# Patient Record
Sex: Male | Born: 1955 | Hispanic: No | Marital: Married | Smoking: Current every day smoker
Health system: Southern US, Community
[De-identification: ages and names within clinical notes are randomized; demographics above are authoritative.]

## PROBLEM LIST (undated history)

## (undated) DIAGNOSIS — I255 Ischemic cardiomyopathy: Secondary | ICD-10-CM

## (undated) DIAGNOSIS — F419 Anxiety disorder, unspecified: Secondary | ICD-10-CM

## (undated) DIAGNOSIS — I5020 Unspecified systolic (congestive) heart failure: Secondary | ICD-10-CM

## (undated) DIAGNOSIS — M539 Dorsopathy, unspecified: Secondary | ICD-10-CM

## (undated) DIAGNOSIS — E785 Hyperlipidemia, unspecified: Secondary | ICD-10-CM

## (undated) DIAGNOSIS — I739 Peripheral vascular disease, unspecified: Secondary | ICD-10-CM

## (undated) DIAGNOSIS — I493 Ventricular premature depolarization: Secondary | ICD-10-CM

## (undated) DIAGNOSIS — Z720 Tobacco use: Secondary | ICD-10-CM

## (undated) DIAGNOSIS — M549 Dorsalgia, unspecified: Secondary | ICD-10-CM

## (undated) DIAGNOSIS — G8929 Other chronic pain: Secondary | ICD-10-CM

## (undated) DIAGNOSIS — I2510 Atherosclerotic heart disease of native coronary artery without angina pectoris: Secondary | ICD-10-CM

## (undated) DIAGNOSIS — I10 Essential (primary) hypertension: Secondary | ICD-10-CM

## (undated) DIAGNOSIS — I209 Angina pectoris, unspecified: Secondary | ICD-10-CM

## (undated) DIAGNOSIS — A6920 Lyme disease, unspecified: Secondary | ICD-10-CM

## (undated) DIAGNOSIS — M1990 Unspecified osteoarthritis, unspecified site: Secondary | ICD-10-CM

## (undated) HISTORY — DX: Ventricular premature depolarization: I49.3

## (undated) HISTORY — DX: Unspecified systolic (congestive) heart failure: I50.20

## (undated) HISTORY — PX: CORONARY ANGIOPLASTY: SHX604

## (undated) HISTORY — DX: Peripheral vascular disease, unspecified: I73.9

## (undated) HISTORY — DX: Ischemic cardiomyopathy: I25.5

## (undated) HISTORY — DX: Hyperlipidemia, unspecified: E78.5

## (undated) HISTORY — DX: Tobacco use: Z72.0

---

## 2004-11-05 ENCOUNTER — Emergency Department: Payer: Self-pay | Admitting: Emergency Medicine

## 2005-12-10 DIAGNOSIS — I219 Acute myocardial infarction, unspecified: Secondary | ICD-10-CM

## 2005-12-10 HISTORY — DX: Acute myocardial infarction, unspecified: I21.9

## 2005-12-29 ENCOUNTER — Other Ambulatory Visit: Payer: Self-pay

## 2005-12-29 ENCOUNTER — Emergency Department: Payer: Self-pay | Admitting: Emergency Medicine

## 2006-02-09 HISTORY — PX: CORONARY ARTERY BYPASS GRAFT: SHX141

## 2006-02-23 ENCOUNTER — Encounter: Payer: Self-pay | Admitting: Cardiovascular Disease

## 2007-01-19 ENCOUNTER — Ambulatory Visit: Payer: Self-pay | Admitting: Gastroenterology

## 2007-04-29 DIAGNOSIS — I2510 Atherosclerotic heart disease of native coronary artery without angina pectoris: Secondary | ICD-10-CM | POA: Insufficient documentation

## 2007-04-29 DIAGNOSIS — I25110 Atherosclerotic heart disease of native coronary artery with unstable angina pectoris: Secondary | ICD-10-CM | POA: Insufficient documentation

## 2007-04-29 DIAGNOSIS — I10 Essential (primary) hypertension: Secondary | ICD-10-CM | POA: Insufficient documentation

## 2007-04-29 DIAGNOSIS — F419 Anxiety disorder, unspecified: Secondary | ICD-10-CM | POA: Insufficient documentation

## 2007-04-29 DIAGNOSIS — E785 Hyperlipidemia, unspecified: Secondary | ICD-10-CM | POA: Insufficient documentation

## 2007-08-17 DIAGNOSIS — A6920 Lyme disease, unspecified: Secondary | ICD-10-CM | POA: Insufficient documentation

## 2007-08-17 DIAGNOSIS — G8929 Other chronic pain: Secondary | ICD-10-CM | POA: Insufficient documentation

## 2010-05-22 ENCOUNTER — Ambulatory Visit: Payer: Self-pay | Admitting: Pain Medicine

## 2010-06-06 ENCOUNTER — Encounter
Admission: RE | Admit: 2010-06-06 | Discharge: 2010-09-04 | Payer: Self-pay | Admitting: Physical Medicine & Rehabilitation

## 2011-06-05 ENCOUNTER — Ambulatory Visit: Payer: Self-pay | Admitting: Rheumatology

## 2012-03-24 DIAGNOSIS — M549 Dorsalgia, unspecified: Secondary | ICD-10-CM | POA: Insufficient documentation

## 2012-03-29 ENCOUNTER — Ambulatory Visit: Payer: Self-pay | Admitting: Emergency Medicine

## 2012-03-29 LAB — CREATININE, SERUM
Creatinine: 1.03 mg/dL (ref 0.60–1.30)
EGFR (African American): >60

## 2013-08-11 ENCOUNTER — Emergency Department: Payer: Self-pay | Admitting: Emergency Medicine

## 2013-08-11 LAB — BASIC METABOLIC PANEL
Anion Gap: 5 — ABNORMAL LOW (ref 7–16)
Calcium, Total: 8.4 mg/dL — ABNORMAL LOW (ref 8.5–10.1)
Chloride: 102 mmol/L (ref 98–107)
Creatinine: 0.79 mg/dL (ref 0.60–1.30)
EGFR (African American): >60
Glucose: 104 mg/dL — ABNORMAL HIGH (ref 65–99)
Potassium: 3.7 mmol/L (ref 3.5–5.1)
Sodium: 137 mmol/L (ref 136–145)

## 2013-08-11 LAB — HEPATIC FUNCTION PANEL A (ARMC)
Alkaline Phosphatase: 77 U/L (ref 50–136)
Bilirubin, Direct: <0.1 mg/dL (ref 0.00–0.20)
Bilirubin,Total: 0.3 mg/dL (ref 0.2–1.0)
SGOT(AST): 33 U/L (ref 15–37)
SGPT (ALT): 28 U/L (ref 12–78)

## 2013-08-11 LAB — CK TOTAL AND CKMB (NOT AT ARMC)
CK, Total: 99 U/L (ref 35–232)
CK-MB: 1.4 ng/mL (ref 0.5–3.6)

## 2013-08-11 LAB — ETHANOL
Ethanol %: 0.277 % — ABNORMAL HIGH (ref 0.000–0.080)
Ethanol: 277 mg/dL

## 2013-08-11 LAB — CBC
HCT: 43.6 % (ref 40.0–52.0)
HGB: 15.4 g/dL (ref 13.0–18.0)
MCHC: 35.3 g/dL (ref 32.0–36.0)

## 2013-08-11 LAB — TROPONIN I: Troponin-I: <0.02 ng/mL

## 2014-05-17 ENCOUNTER — Emergency Department: Payer: Self-pay | Admitting: Emergency Medicine

## 2014-05-17 LAB — CBC
HCT: 50.5 % (ref 40.0–52.0)
HGB: 17 g/dL (ref 13.0–18.0)
MCH: 31.8 pg (ref 26.0–34.0)
MCHC: 33.7 g/dL (ref 32.0–36.0)
MCV: 94 fL (ref 80–100)
Platelet: 206 10*3/uL (ref 150–440)
RBC: 5.36 10*6/uL (ref 4.40–5.90)
RDW: 13.3 % (ref 11.5–14.5)
WBC: 8.2 10*3/uL (ref 3.8–10.6)

## 2014-05-17 LAB — DRUG SCREEN, URINE
AMPHETAMINES, UR SCREEN: NEGATIVE (ref ?–1000)
BENZODIAZEPINE, UR SCRN: POSITIVE (ref ?–200)
Barbiturates, Ur Screen: NEGATIVE (ref ?–200)
Cannabinoid 50 Ng, Ur ~~LOC~~: NEGATIVE (ref ?–50)
Cocaine Metabolite,Ur ~~LOC~~: NEGATIVE (ref ?–300)
MDMA (Ecstasy)Ur Screen: NEGATIVE (ref ?–500)
Methadone, Ur Screen: NEGATIVE (ref ?–300)
Opiate, Ur Screen: POSITIVE (ref ?–300)
Phencyclidine (PCP) Ur S: NEGATIVE (ref ?–25)
TRICYCLIC, UR SCREEN: NEGATIVE (ref ?–1000)

## 2014-05-17 LAB — COMPREHENSIVE METABOLIC PANEL
ANION GAP: 6 — AB (ref 7–16)
Albumin: 4.4 g/dL (ref 3.4–5.0)
Alkaline Phosphatase: 70 U/L
BUN: 8 mg/dL (ref 7–18)
Bilirubin,Total: 0.4 mg/dL (ref 0.2–1.0)
Calcium, Total: 8.5 mg/dL (ref 8.5–10.1)
Chloride: 98 mmol/L (ref 98–107)
Co2: 30 mmol/L (ref 21–32)
Creatinine: 0.83 mg/dL (ref 0.60–1.30)
GLUCOSE: 102 mg/dL — AB (ref 65–99)
Osmolality: 267 (ref 275–301)
Potassium: 3.9 mmol/L (ref 3.5–5.1)
SGOT(AST): 32 U/L (ref 15–37)
SGPT (ALT): 18 U/L
Sodium: 134 mmol/L — ABNORMAL LOW (ref 136–145)
Total Protein: 8.7 g/dL — ABNORMAL HIGH (ref 6.4–8.2)

## 2014-05-17 LAB — URINALYSIS, COMPLETE
Bacteria: NONE SEEN
Bilirubin,UR: NEGATIVE
GLUCOSE, UR: NEGATIVE mg/dL (ref 0–75)
Leukocyte Esterase: NEGATIVE
Nitrite: NEGATIVE
PROTEIN: NEGATIVE
Ph: 6 (ref 4.5–8.0)
RBC,UR: 3 /HPF (ref 0–5)
Specific Gravity: 1.011 (ref 1.003–1.030)
Squamous Epithelial: NONE SEEN
WBC UR: NONE SEEN /HPF (ref 0–5)

## 2014-05-17 LAB — ACETAMINOPHEN LEVEL: Acetaminophen: <2 ug/mL

## 2014-05-17 LAB — ETHANOL
ETHANOL LVL: 249 mg/dL
Ethanol %: 0.249 % — ABNORMAL HIGH (ref 0.000–0.080)

## 2014-05-17 LAB — SALICYLATE LEVEL: Salicylates, Serum: 6.7 mg/dL — ABNORMAL HIGH

## 2015-05-06 ENCOUNTER — Other Ambulatory Visit
Admission: RE | Admit: 2015-05-06 | Discharge: 2015-05-06 | Disposition: A | Discharge status: Home / Self Care | Payer: Medicare HMO | Source: Ambulatory Visit | Attending: Cardiovascular Disease | Admitting: Cardiovascular Disease

## 2015-05-06 DIAGNOSIS — I2510 Atherosclerotic heart disease of native coronary artery without angina pectoris: Secondary | ICD-10-CM | POA: Insufficient documentation

## 2015-05-06 DIAGNOSIS — R079 Chest pain, unspecified: Secondary | ICD-10-CM | POA: Insufficient documentation

## 2015-05-06 LAB — CBC WITH DIFFERENTIAL/PLATELET
BASOS ABS: 0.1 10*3/uL (ref 0–0.1)
Basophils Relative: 1 %
Eosinophils Absolute: 0.1 10*3/uL (ref 0–0.7)
Eosinophils Relative: 2 %
HCT: 41.6 % (ref 40.0–52.0)
Hemoglobin: 14.2 g/dL (ref 13.0–18.0)
LYMPHS ABS: 1.9 10*3/uL (ref 1.0–3.6)
Lymphocytes Relative: 23 %
MCH: 31.7 pg (ref 26.0–34.0)
MCHC: 34.1 g/dL (ref 32.0–36.0)
MCV: 93 fL (ref 80.0–100.0)
Monocytes Absolute: 0.9 10*3/uL (ref 0.2–1.0)
Monocytes Relative: 10 %
NEUTROS ABS: 5.5 10*3/uL (ref 1.4–6.5)
NEUTROS PCT: 64 %
Platelets: 211 10*3/uL (ref 150–440)
RBC: 4.47 MIL/uL (ref 4.40–5.90)
RDW: 14 % (ref 11.5–14.5)
WBC: 8.5 10*3/uL (ref 3.8–10.6)

## 2015-05-06 LAB — COMPREHENSIVE METABOLIC PANEL
ALBUMIN: 4.6 g/dL (ref 3.5–5.0)
ALT: 14 U/L — ABNORMAL LOW (ref 17–63)
AST: 23 U/L (ref 15–41)
Alkaline Phosphatase: 55 U/L (ref 38–126)
Anion gap: 9 (ref 5–15)
BUN: 11 mg/dL (ref 6–20)
CO2: 30 mmol/L (ref 22–32)
Calcium: 9.4 mg/dL (ref 8.9–10.3)
Chloride: 98 mmol/L — ABNORMAL LOW (ref 101–111)
Creatinine, Ser: 0.83 mg/dL (ref 0.61–1.24)
GFR calc Af Amer: >60 mL/min (ref >60)
GFR calc non Af Amer: >60 mL/min (ref >60)
Glucose, Bld: 100 mg/dL — ABNORMAL HIGH (ref 65–99)
POTASSIUM: 4.1 mmol/L (ref 3.5–5.1)
SODIUM: 137 mmol/L (ref 135–145)
Total Bilirubin: 0.8 mg/dL (ref 0.3–1.2)
Total Protein: 7.8 g/dL (ref 6.5–8.1)

## 2015-05-06 LAB — PROTIME-INR
INR: 1
Prothrombin Time: 13.4 seconds (ref 11.4–15.0)

## 2015-05-07 ENCOUNTER — Encounter
Admission: RE | Disposition: A | Discharge status: Home / Self Care | Payer: Self-pay | Source: Ambulatory Visit | Attending: Cardiovascular Disease

## 2015-05-07 ENCOUNTER — Encounter: Payer: Self-pay | Admitting: *Deleted

## 2015-05-07 ENCOUNTER — Inpatient Hospital Stay
Admission: RE | Admit: 2015-05-07 | Discharge: 2015-05-07 | DRG: 287 | Disposition: A | Discharge status: Home / Self Care | Payer: Medicare HMO | Source: Ambulatory Visit | Attending: Cardiovascular Disease | Admitting: Cardiovascular Disease

## 2015-05-07 DIAGNOSIS — Z833 Family history of diabetes mellitus: Secondary | ICD-10-CM | POA: Diagnosis not present

## 2015-05-07 DIAGNOSIS — I25110 Atherosclerotic heart disease of native coronary artery with unstable angina pectoris: Principal | ICD-10-CM | POA: Diagnosis present

## 2015-05-07 DIAGNOSIS — E785 Hyperlipidemia, unspecified: Secondary | ICD-10-CM | POA: Diagnosis present

## 2015-05-07 DIAGNOSIS — I249 Acute ischemic heart disease, unspecified: Secondary | ICD-10-CM | POA: Diagnosis present

## 2015-05-07 DIAGNOSIS — I252 Old myocardial infarction: Secondary | ICD-10-CM

## 2015-05-07 DIAGNOSIS — Z79899 Other long term (current) drug therapy: Secondary | ICD-10-CM | POA: Diagnosis not present

## 2015-05-07 DIAGNOSIS — Z7982 Long term (current) use of aspirin: Secondary | ICD-10-CM

## 2015-05-07 DIAGNOSIS — I25700 Atherosclerosis of coronary artery bypass graft(s), unspecified, with unstable angina pectoris: Secondary | ICD-10-CM | POA: Diagnosis not present

## 2015-05-07 DIAGNOSIS — Z8249 Family history of ischemic heart disease and other diseases of the circulatory system: Secondary | ICD-10-CM | POA: Diagnosis not present

## 2015-05-07 DIAGNOSIS — I200 Unstable angina: Secondary | ICD-10-CM | POA: Diagnosis present

## 2015-05-07 DIAGNOSIS — F17210 Nicotine dependence, cigarettes, uncomplicated: Secondary | ICD-10-CM | POA: Diagnosis present

## 2015-05-07 HISTORY — DX: Atherosclerotic heart disease of native coronary artery without angina pectoris: I25.10

## 2015-05-07 HISTORY — DX: Dorsalgia, unspecified: M54.9

## 2015-05-07 HISTORY — DX: Unspecified osteoarthritis, unspecified site: M19.90

## 2015-05-07 HISTORY — DX: Anxiety disorder, unspecified: F41.9

## 2015-05-07 HISTORY — PX: CARDIAC CATHETERIZATION: SHX172

## 2015-05-07 HISTORY — DX: Angina pectoris, unspecified: I20.9

## 2015-05-07 HISTORY — DX: Essential (primary) hypertension: I10

## 2015-05-07 HISTORY — DX: Other chronic pain: G89.29

## 2015-05-07 HISTORY — DX: Lyme disease, unspecified: A69.20

## 2015-05-07 SURGERY — LEFT HEART CATH
Anesthesia: Moderate Sedation

## 2015-05-07 MED ORDER — ONDANSETRON HCL 4 MG/2ML IJ SOLN: 4.0000 mg | Freq: Four times a day (QID) | INTRAMUSCULAR | Status: DC | PRN

## 2015-05-07 MED ORDER — ACETAMINOPHEN 325 MG PO TABS: 650.0000 mg | ORAL_TABLET | ORAL | Status: DC | PRN

## 2015-05-07 MED ORDER — HEPARIN (PORCINE) IN NACL 2-0.9 UNIT/ML-% IJ SOLN
INTRAMUSCULAR | Status: AC
  Filled 2015-05-07: qty 1000

## 2015-05-07 MED ORDER — MIDAZOLAM HCL 2 MG/2ML IJ SOLN
INTRAMUSCULAR | Status: AC
  Filled 2015-05-07: qty 2

## 2015-05-07 MED ORDER — SODIUM CHLORIDE 0.9 % IV SOLN
INTRAVENOUS | Status: DC
  Administered 2015-05-07: 08:00:00 via INTRAVENOUS

## 2015-05-07 MED ORDER — SODIUM CHLORIDE 0.9 % IJ SOLN: 3.0000 mL | INTRAMUSCULAR | Status: DC | PRN

## 2015-05-07 MED ORDER — SODIUM CHLORIDE 0.9 % IV SOLN: 250.0000 mL | INTRAVENOUS | Status: DC | PRN

## 2015-05-07 MED ORDER — MIDAZOLAM HCL 2 MG/2ML IJ SOLN
INTRAMUSCULAR | Status: DC | PRN
  Administered 2015-05-07: 2 mg via INTRAVENOUS

## 2015-05-07 MED ORDER — SODIUM CHLORIDE 0.9 % IJ SOLN: 3.0000 mL | Freq: Two times a day (BID) | INTRAMUSCULAR | Status: DC

## 2015-05-07 MED ORDER — SODIUM CHLORIDE 0.9 % WEIGHT BASED INFUSION: 1.0000 mL/kg/h | INTRAVENOUS | Status: DC

## 2015-05-07 MED ORDER — FENTANYL CITRATE (PF) 100 MCG/2ML IJ SOLN
INTRAMUSCULAR | Status: DC | PRN
  Administered 2015-05-07 (×2): 50 ug via INTRAVENOUS

## 2015-05-07 MED ORDER — LIDOCAINE HCL (PF) 1 % IJ SOLN
INTRAMUSCULAR | Status: DC | PRN
  Administered 2015-05-07: 30 mL via SUBCUTANEOUS

## 2015-05-07 MED ORDER — IOHEXOL 300 MG/ML  SOLN
INTRAMUSCULAR | Status: DC | PRN
  Administered 2015-05-07: 235 mL via INTRA_ARTERIAL

## 2015-05-07 MED ORDER — FENTANYL CITRATE (PF) 100 MCG/2ML IJ SOLN
INTRAMUSCULAR | Status: AC
  Filled 2015-05-07: qty 2

## 2015-05-07 SURGICAL SUPPLY — 13 items
CATH ANGIO 5F JB2 100CM (CATHETERS) ×3 IMPLANT
CATH INFINITI 5 FR IM (CATHETERS) ×3 IMPLANT
CATH INFINITI 5 FR MPA2 (CATHETERS) ×3 IMPLANT
CATH INFINITI 5FR ANG PIGTAIL (CATHETERS) ×3 IMPLANT
CATH INFINITI 5FR JL4 (CATHETERS) ×3 IMPLANT
CATH INFINITI JR4 5F (CATHETERS) ×3 IMPLANT
DEVICE CLOSURE MYNXGRIP 5F (Vascular Products) ×3 IMPLANT
KIT MANI 3VAL PERCEP (MISCELLANEOUS) ×3 IMPLANT
NEEDLE PERC 18GX7CM (NEEDLE) ×3 IMPLANT
PACK CARDIAC CATH (CUSTOM PROCEDURE TRAY) ×3 IMPLANT
SHEATH PINNACLE 5F 10CM (SHEATH) ×3 IMPLANT
WIRE EMERALD 3MM-J .035X150CM (WIRE) ×6 IMPLANT
WIRE SAFE-T 1.5MM-J .035X260CM (WIRE) ×3 IMPLANT

## 2015-05-07 NOTE — OR Nursing (Signed)
sitting up to eat lunch.

## 2015-05-07 NOTE — Discharge Instructions (Signed)

## 2015-05-07 NOTE — OR Nursing (Signed)
Wife wants to make follow up appt with Dr Chancy Milroy.

## 2015-05-08 ENCOUNTER — Encounter: Payer: Self-pay | Admitting: Cardiovascular Disease

## 2015-08-01 IMAGING — CR DG CHEST 2V
1 series · 3 of 3 positions shown · non-contrast
Comparison: none

REASON FOR EXAM: Chest Pain
COMMENTS:

PROCEDURE:     DXR - DXR CHEST PA (OR AP) AND LATERAL  - August 11, 2013  [DATE]
RESULT:     The lungs are clear. The cardiac silhouette and visualized bony
skeleton are unremarkable. Patient status post median sternotomy and
coronary artery bypass grafting.

[Series 1: w chest lat · 0.14mm/px · 3 of 3 slices shown]
[im 1/3]
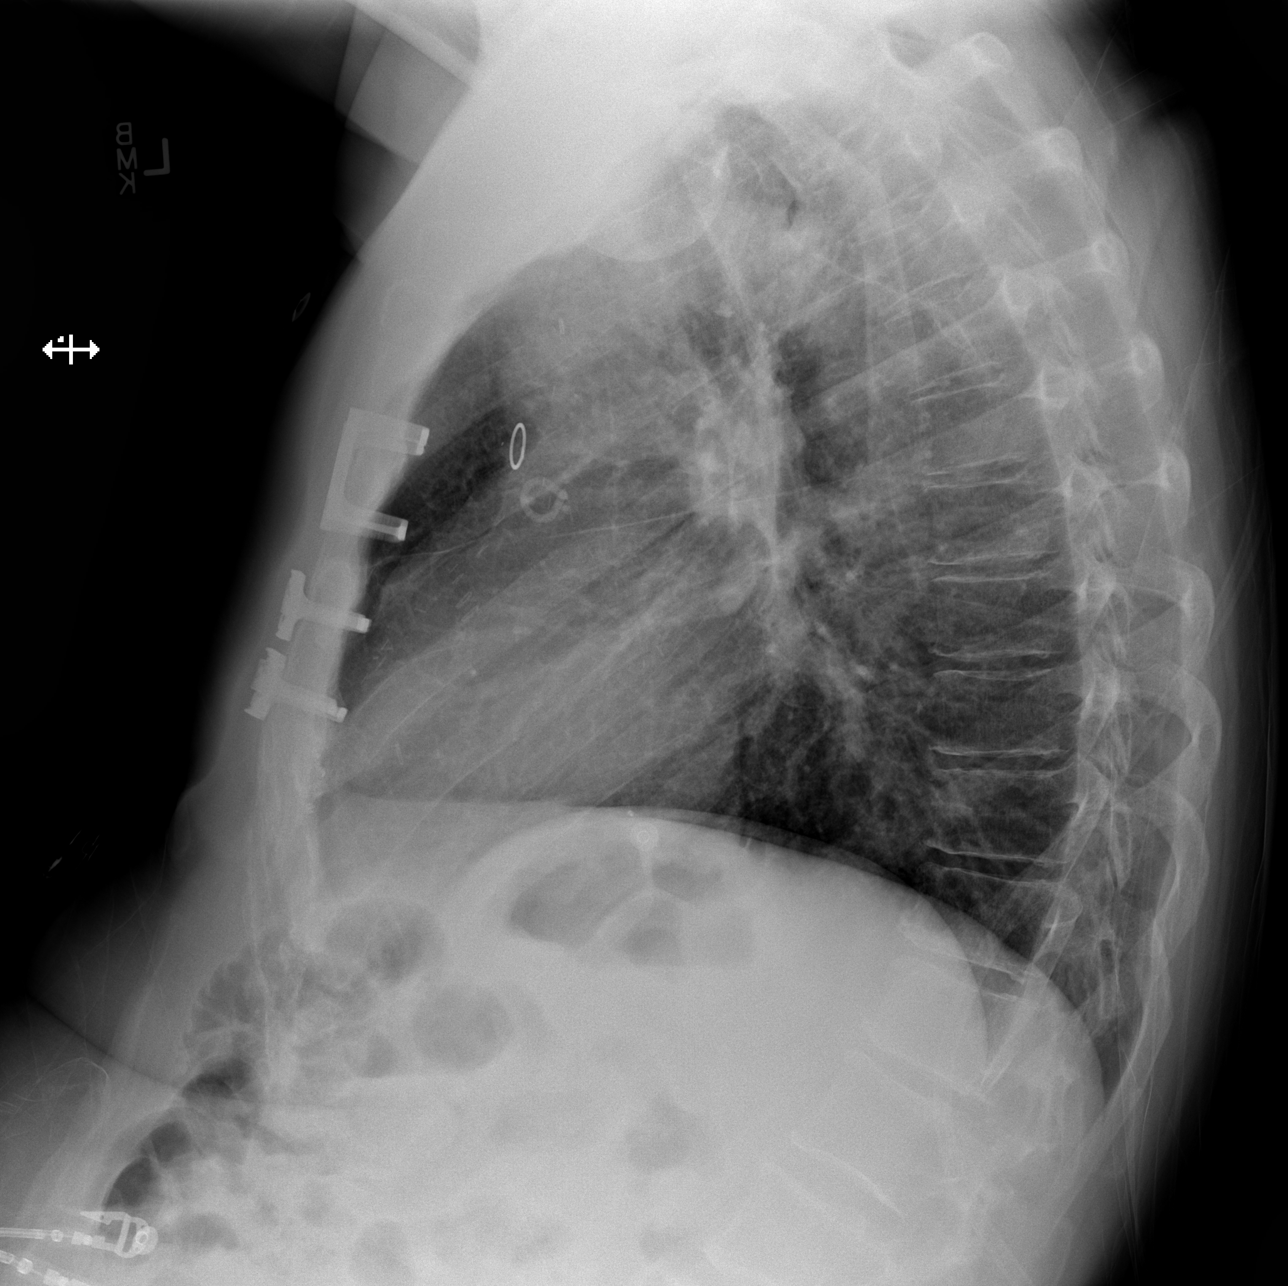
[im 2/3]
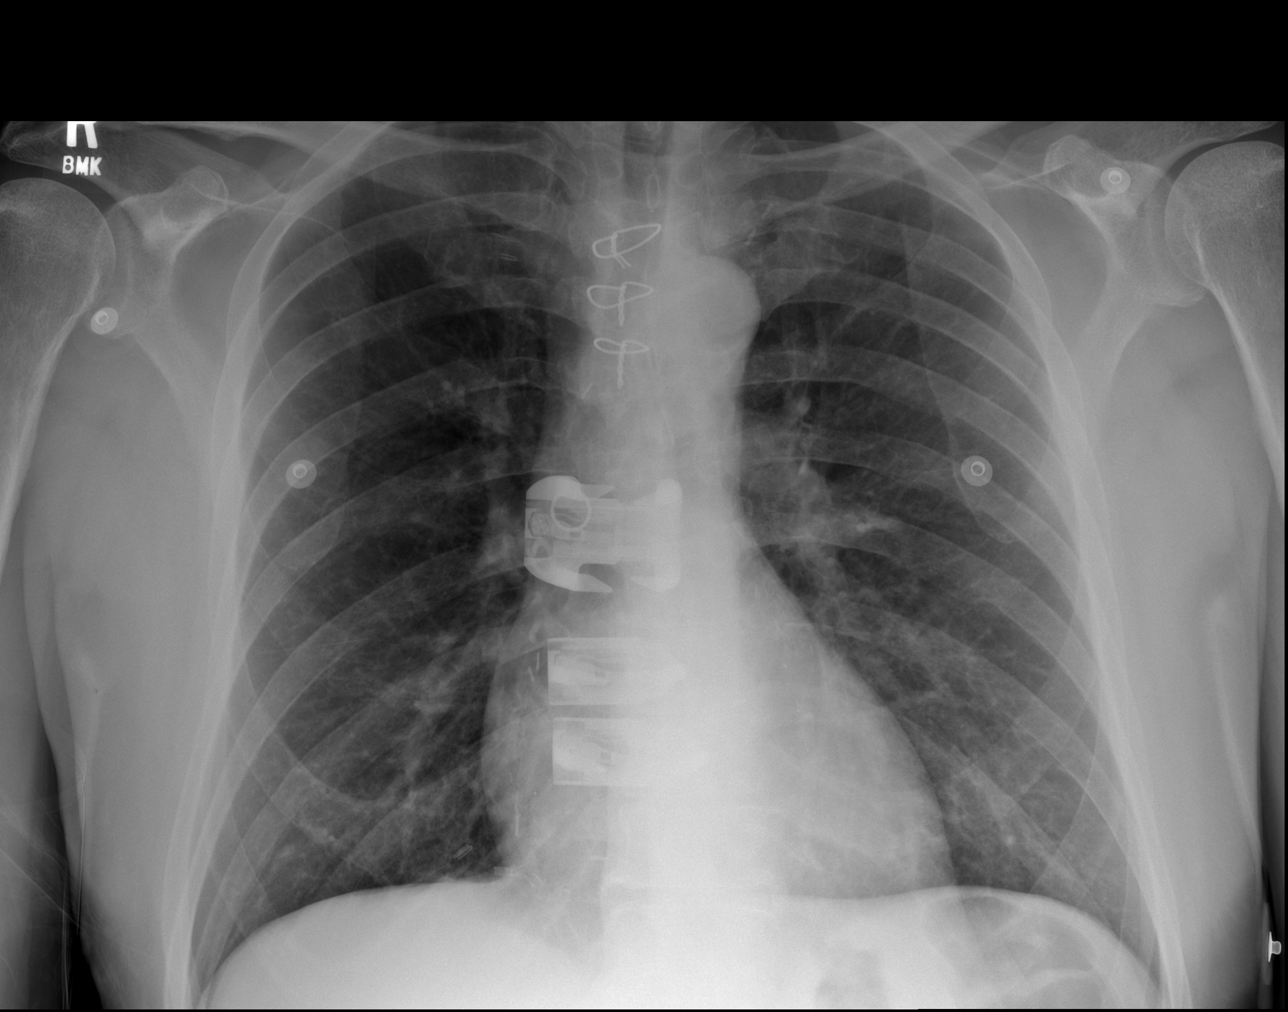
[im 3/3]
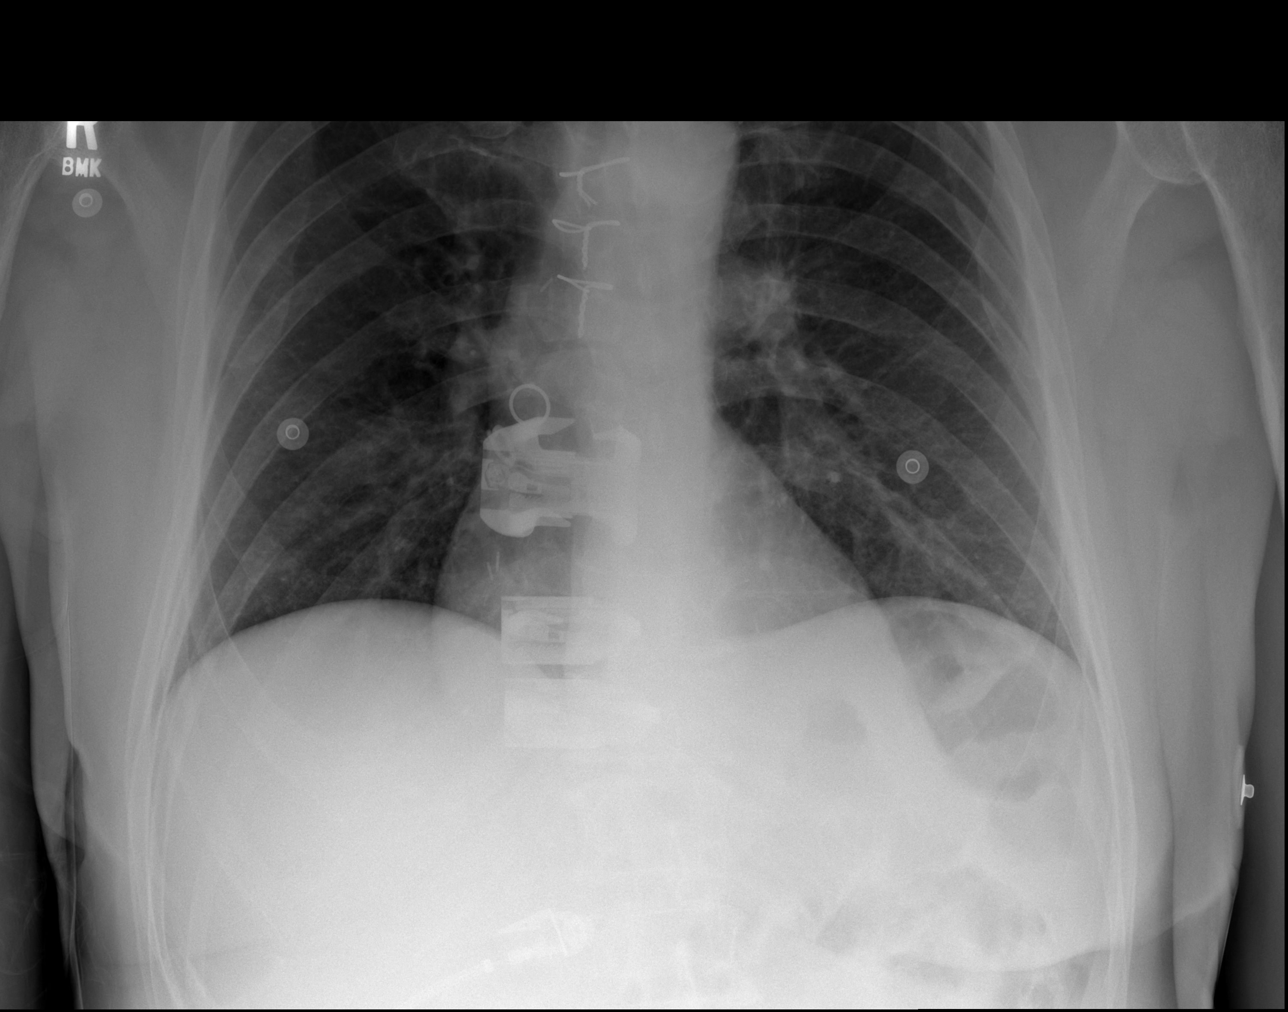

[3 of 3 positions shown; findings below may reference images not displayed]

IMPRESSION: 1. Chest radiograph without evidence of acute cardiopulmonary disease.

## 2016-05-06 IMAGING — CT CT HEAD WITHOUT CONTRAST
1 series · 16 of 30 positions shown, 20 images · non-contrast
Comparison: None.

CLINICAL DATA: Fall with right face contact.  With intoxication

EXAM:
CT HEAD WITHOUT CONTRAST
TECHNIQUE: Contiguous axial images were obtained from the base of the skull
through the vertex without intravenous contrast.

[Series 2: head wo · axial · 0.42mm/px · z∈[-138,+10]mm · 16 of 36 slices shown, 20 images]
[im 2/36  brain]
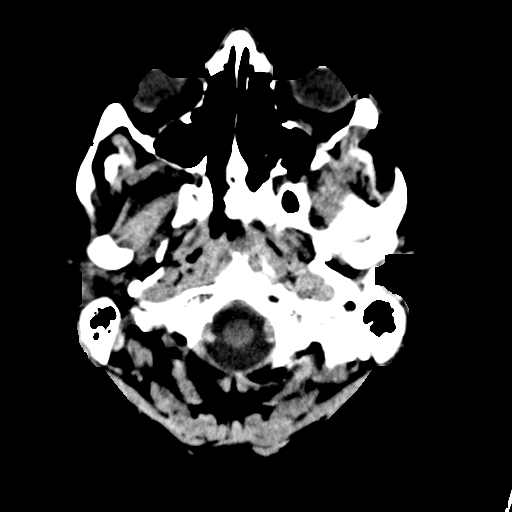
[im 2/36  bone]
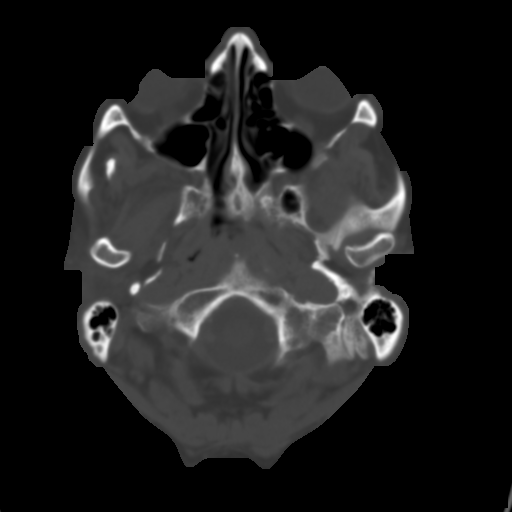
[im 4/36  brain]
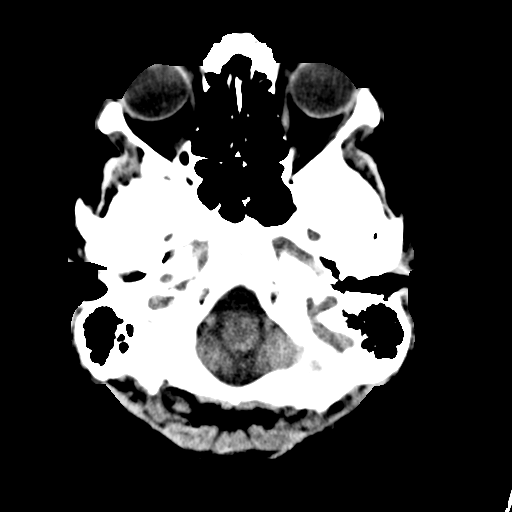
[im 7/36  brain]
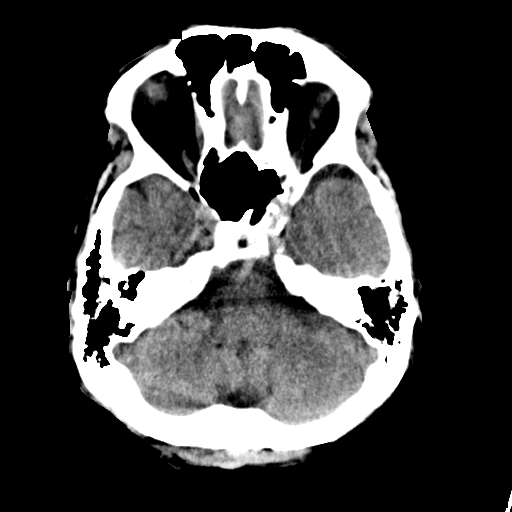
[im 9/36  brain]
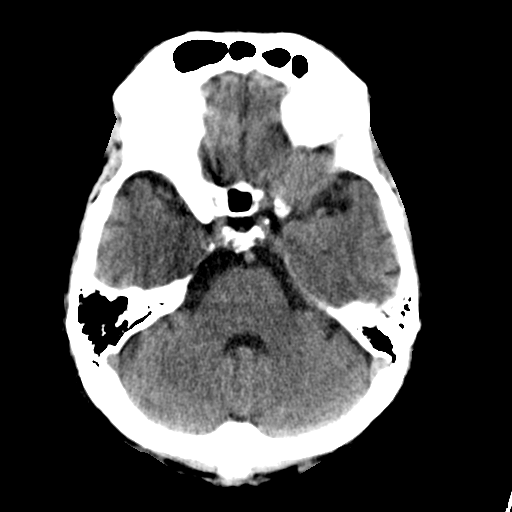
[im 10/36  brain]
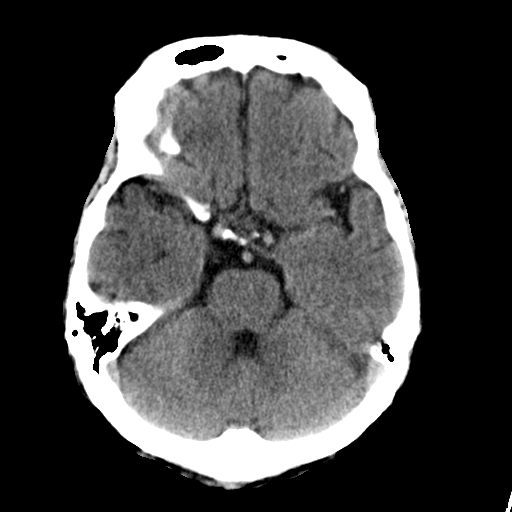
[im 10/36  bone]
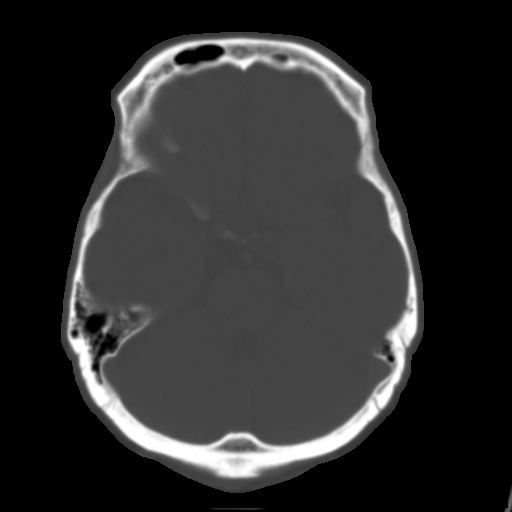
[im 13/36  brain]
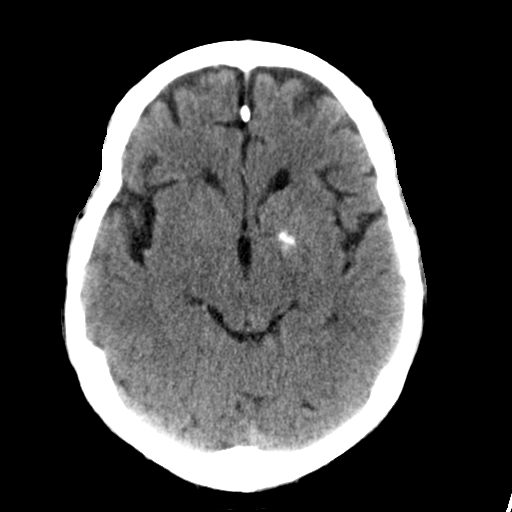
[im 15/36  brain]
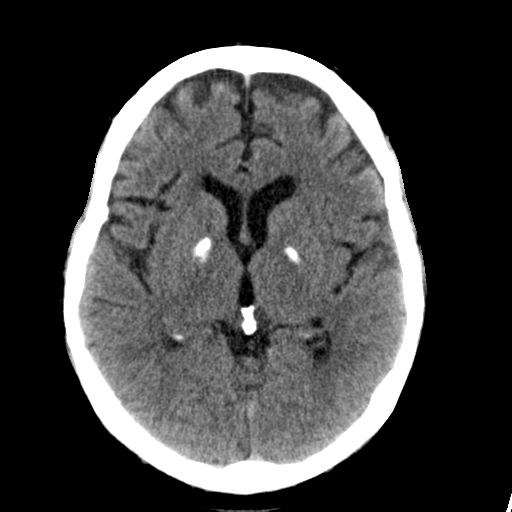
[im 17/36  brain]
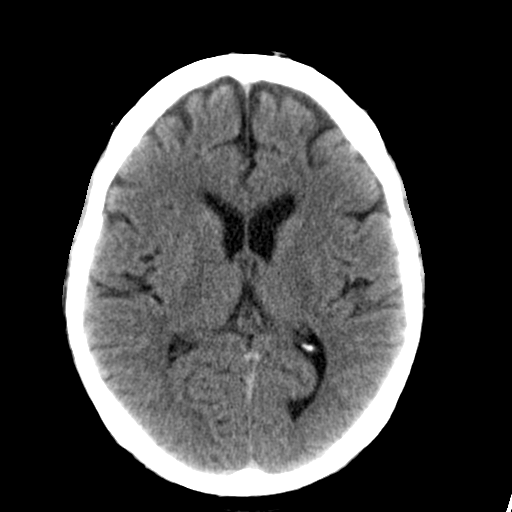
[im 19/36  brain]
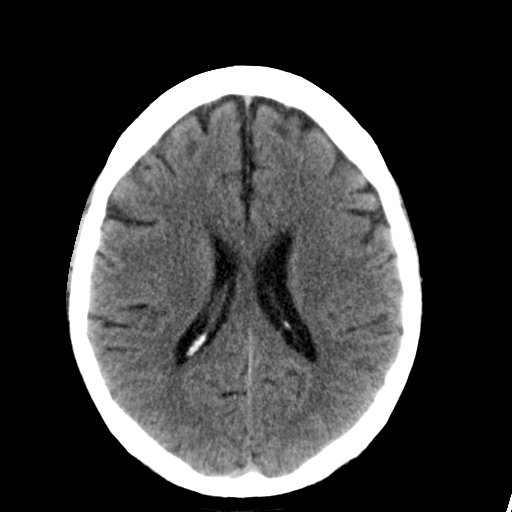
[im 19/36  bone]
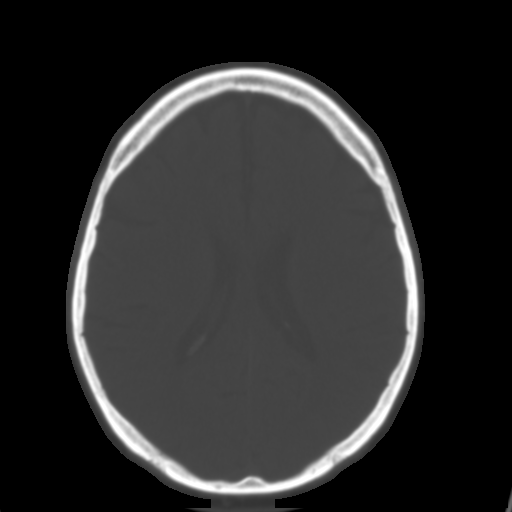
[im 21/36  brain]
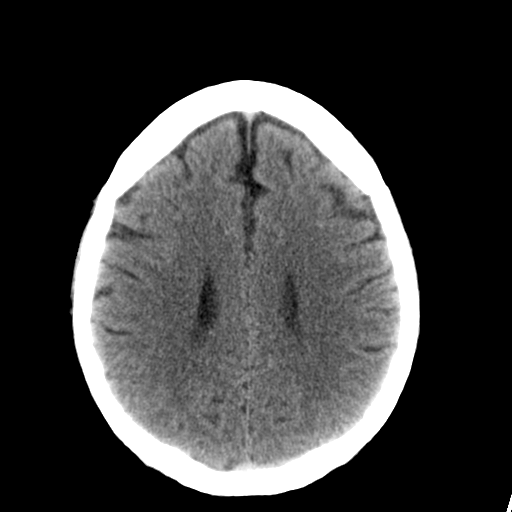
[im 23/36  brain]
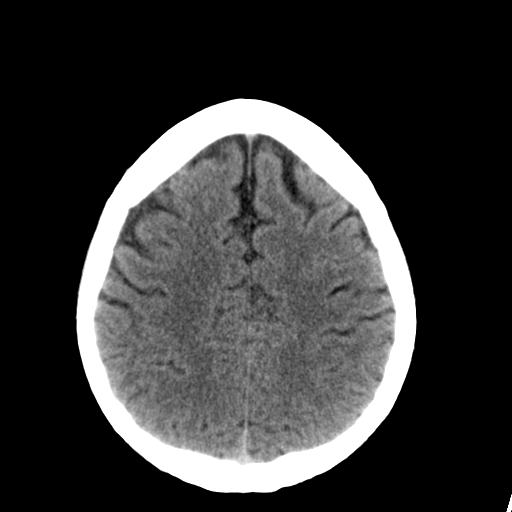
[im 26/36  brain]
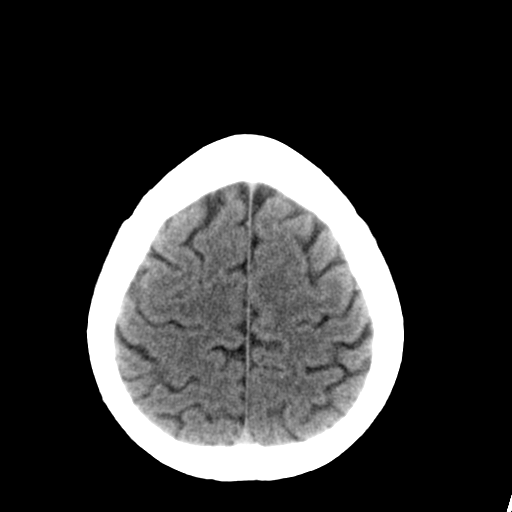
[im 27/36  brain]
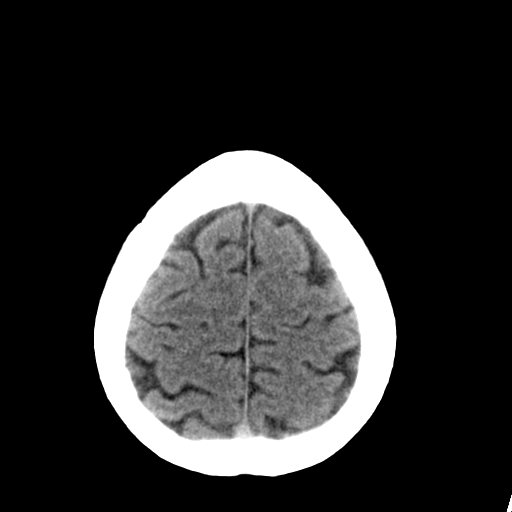
[im 27/36  bone]
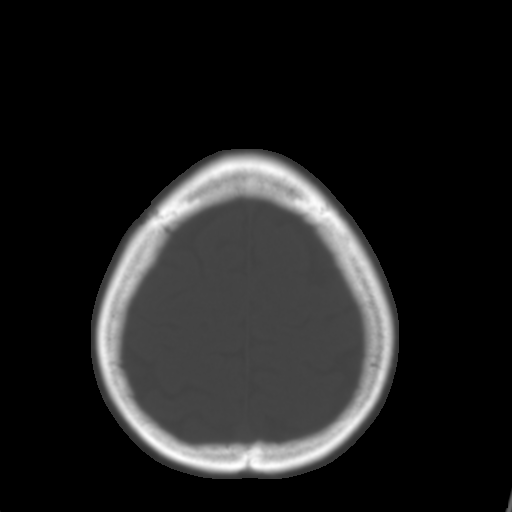
[im 29/36  brain]
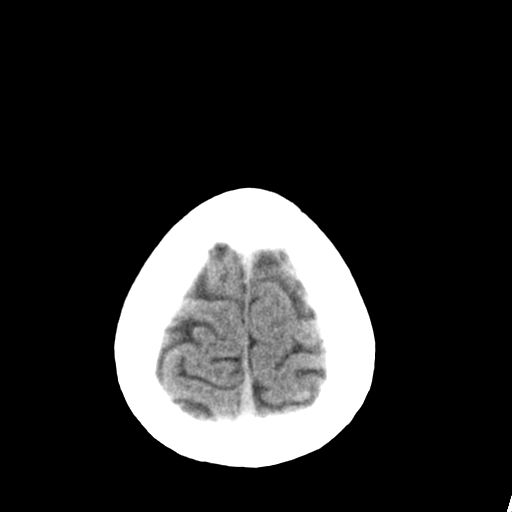
[im 32/36  brain]
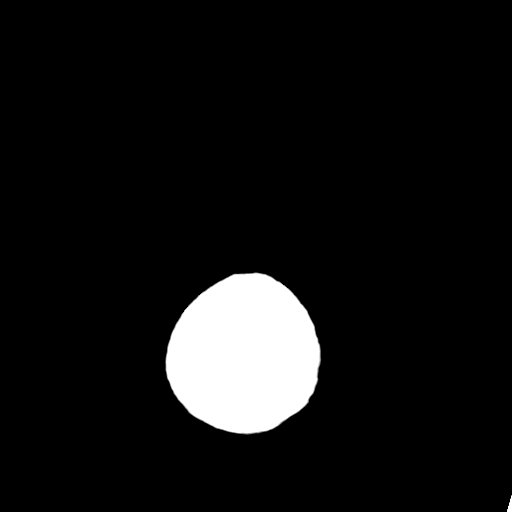
[im 34/36  brain]
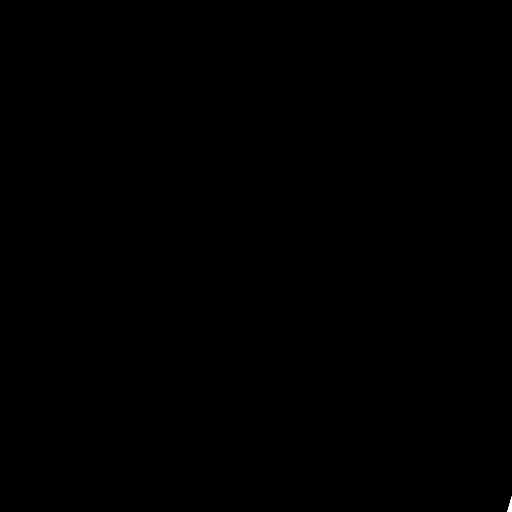

[16 of 30 positions shown; findings below may reference images not displayed]

FINDINGS: Skull and Sinuses:Negative for fracture or destructive process. The
mastoids, middle ears, and imaged paranasal sinuses are clear.

Orbits: No acute abnormality.

Brain: No evidence of acute abnormality, such as acute infarction,
hemorrhage, hydrocephalus, or mass lesion/mass effect. Prominent
globus pallidus mineralization, not definitively abnormal given age.
IMPRESSION: No acute intracranial injury.

## 2017-07-27 DIAGNOSIS — R5383 Other fatigue: Secondary | ICD-10-CM | POA: Insufficient documentation

## 2017-07-27 DIAGNOSIS — I719 Aortic aneurysm of unspecified site, without rupture: Secondary | ICD-10-CM | POA: Insufficient documentation

## 2018-06-20 DIAGNOSIS — J329 Chronic sinusitis, unspecified: Secondary | ICD-10-CM | POA: Insufficient documentation

## 2018-09-06 ENCOUNTER — Other Ambulatory Visit (HOSPITAL_BASED_OUTPATIENT_CLINIC_OR_DEPARTMENT_OTHER): Payer: Self-pay

## 2018-09-06 DIAGNOSIS — R5383 Other fatigue: Secondary | ICD-10-CM

## 2018-09-06 DIAGNOSIS — R0683 Snoring: Secondary | ICD-10-CM

## 2018-09-06 DIAGNOSIS — G478 Other sleep disorders: Secondary | ICD-10-CM

## 2018-09-12 ENCOUNTER — Ambulatory Visit (HOSPITAL_BASED_OUTPATIENT_CLINIC_OR_DEPARTMENT_OTHER): Payer: Medicare HMO | Attending: Anesthesiology

## 2018-11-25 ENCOUNTER — Encounter: Payer: Self-pay | Admitting: Cardiovascular Disease

## 2018-11-25 ENCOUNTER — Ambulatory Visit: Payer: Medicare HMO | Admitting: Cardiovascular Disease

## 2018-11-25 VITALS — BP 133/84 | HR 67 | Ht 73.0 in | Wt 203.2 lb

## 2018-11-25 DIAGNOSIS — I10 Essential (primary) hypertension: Secondary | ICD-10-CM

## 2018-11-25 DIAGNOSIS — Z720 Tobacco use: Secondary | ICD-10-CM | POA: Diagnosis not present

## 2018-11-25 DIAGNOSIS — E785 Hyperlipidemia, unspecified: Secondary | ICD-10-CM | POA: Diagnosis not present

## 2018-11-25 DIAGNOSIS — I2510 Atherosclerotic heart disease of native coronary artery without angina pectoris: Secondary | ICD-10-CM

## 2018-11-25 NOTE — Patient Instructions (Signed)
Medication Instructions:  No changes If you need a refill on your cardiac medications before your next appointment, please call your pharmacy.   Lab work: None ordered  Testing/Procedures: None ordered  Follow-Up: At [facility], you and your health needs are our priority.  As part of our continuing mission to provide you with exceptional heart care, we have created designated Provider Care Teams.  These Care Teams include your primary Cardiologist (physician) and Advanced Practice Providers (APPs -  Physician Assistants and Nurse Practitioners) who all work together to provide you with the care you need, when you need it. You will need a follow up appointment in 6 months.  Please call our office 2 months in advance to schedule this appointment.  You may see Dr. Fletcher Anon or one of the following Advanced Practice Providers on your designated Care Team:   Murray Hodgkins, NP Christell Faith, PA-C . Marrianne Mood, PA-C  Any Other Special Instructions Will Be Listed Below (If Applicable).  Steps to Quit Smoking  Smoking tobacco can be bad for your health. It can also affect almost every organ in your body. Smoking puts you and people around you at risk for many serious long-lasting (chronic) diseases. Quitting smoking is hard, but it is one of the best things that you can do for your health. It is never too late to quit. What are the benefits of quitting smoking? When you quit smoking, you lower your risk for getting serious diseases and conditions. They can include:  Lung cancer or lung disease.  Heart disease.  Stroke.  Heart attack.  Not being able to have children (infertility).  Weak bones (osteoporosis) and broken bones (fractures). If you have coughing, wheezing, and shortness of breath, those symptoms may get better when you quit. You may also get sick less often. If you are pregnant, quitting smoking can help to lower your chances of having a baby of low birth weight. What  can I do to help me quit smoking? Talk with your doctor about what can help you quit smoking. Some things you can do (strategies) include:  Quitting smoking totally, instead of slowly cutting back how much you smoke over a period of time.  Going to in-person counseling. You are more likely to quit if you go to many counseling sessions.  Using resources and support systems, such as: ? Database administrator with a Social worker. ? Phone quitlines. ? Careers information officer. ? Support groups or group counseling. ? Text messaging programs. ? Mobile phone apps or applications.  Taking medicines. Some of these medicines may have nicotine in them. If you are pregnant or breastfeeding, do not take any medicines to quit smoking unless your doctor says it is okay. Talk with your doctor about counseling or other things that can help you. Talk with your doctor about using more than one strategy at the same time, such as taking medicines while you are also going to in-person counseling. This can help make quitting easier. What things can I do to make it easier to quit? Quitting smoking might feel very hard at first, but there is a lot that you can do to make it easier. Take these steps:  Talk to your family and friends. Ask them to support and encourage you.  Call phone quitlines, reach out to support groups, or work with a Social worker.  Ask people who smoke to not smoke around you.  Avoid places that make you want (trigger) to smoke, such as: ? Bars. ? Parties. ?  areas at work.  Spend time with people who do not smoke.  Lower the stress in your life. Stress can make you want to smoke. Try these things to help your stress: ? Getting regular exercise. ? Deep-breathing exercises. ? Yoga. ? Meditating. ? Doing a body scan. To do this, close your eyes, focus on one area of your body at a time from head to toe, and notice which parts of your body are tense. Try to relax the muscles in those  areas.  Download or buy apps on your mobile phone or tablet that can help you stick to your quit plan. There are many free apps, such as QuitGuide from the CDC (Centers for Disease Control and Prevention). You can find more support from smokefree.gov and other websites. This information is not intended to replace advice given to you by your health care provider. Make sure you discuss any questions you have with your health care provider. Document Released: 07/25/2009 Document Revised: 05/26/2016 Document Reviewed: 02/12/2015 Elsevier Interactive Patient Education  2019 Elsevier Inc.    

## 2018-11-25 NOTE — Progress Notes (Signed)
Cardiology Office Note   Date:  11/29/2018   ID:  Joel Howe, DOB 11/21/1955, MRN [id number]  PCP:  Jodi Marble, MD  Cardiologist:   Kathlyn Sacramento, MD   Chief Complaint  Patient presents with  . other    Est. Care no complaints today. Meds reviewed verbally with pt.      History of Present Illness: Joel Howe is a 63 y.o. male who presents to establish cardiovascular care.  He has known history of coronary artery disease status post CABG in 2007 at [facility], hypertension, tobacco use, chronic pain on narcotics and hyperlipidemia. Most recent cardiac catheterization in 2013 showed significant three-vessel coronary artery disease with patent grafts including LIMA to LAD, SVG to OM and SVG to right PDA.  There was 50 to 60% stenosis in ostial SVG to right PDA. He continues to smoke 1 pack/day and has been doing so since he was 63 years old. He denies any chest pain, shortness of breath or palpitations.  No claudication.    Past Medical History:  Diagnosis Date  . Anginal pain ([place])   . Anxiety   . Arthritis   . Chronic back pain   . Coronary artery disease    CABG in 2007 at [facility] with LIMA to LAD, SVG to OM and SVG to right PDA  . Hyperlipidemia   . Hypertension   . Lyme disease   . Tobacco use     Past Surgical History:  Procedure Laterality Date  . CARDIAC CATHETERIZATION N/A 05/07/2015   Procedure: Left Heart Cath;  Surgeon: Dionisio David, MD;  Location: [place] [facility];  Service: Cardiovascular;  Laterality: N/A;  . CORONARY ANGIOPLASTY       Current Outpatient Medications  Medication Sig Dispense Refill  . ALPRAZolam (XANAX) 1 MG tablet Take 1 mg by mouth 3 (three) times daily.    . clopidogrel (PLAVIX) 75 MG tablet Take 75 mg by mouth daily.    . cyclobenzaprine (FLEXERIL) 10 MG tablet Take 10 mg by mouth 3 (three) times daily as needed for muscle spasms.    Marland Kitchen escitalopram (LEXAPRO) 20 MG tablet Take 20 mg by mouth daily.    Marland Kitchen lisinopril  (PRINIVIL,ZESTRIL) 40 MG tablet Take 40 mg by mouth daily.    . methocarbamol (ROBAXIN) 500 MG tablet TK 1 T PO Q 6 H PRN    . metoprolol succinate (TOPROL-XL) 100 MG 24 hr tablet Take 100 mg by mouth 2 (two) times daily. Take with or immediately following a meal.    . morphine (MS CONTIN) 60 MG 12 hr tablet 2 (two) times daily.    Marland Kitchen oxyCODONE-acetaminophen (PERCOCET) 10-325 MG per tablet Take 1 tablet by mouth every 6 (six) hours as needed for pain.    . rosuvastatin (CRESTOR) 40 MG tablet Take 40 mg by mouth daily.     No current facility-administered medications for this visit.     Allergies:   Morphine and related and Propranolol    Social History:  The patient  reports that he has been smoking cigarettes. He has a 45.00 pack-year smoking history. He has never used smokeless tobacco. He reports that he does not drink alcohol or use drugs.   Family History:  The patient's family history includes Heart attack in his brother, brother, father, mother, and sister.    ROS:  Please see the history of present illness.   Otherwise, review of systems are positive for none.   All other  systems are reviewed and negative.    PHYSICAL EXAM: VS:  BP 133/84 (BP Location: Right Arm, Patient Position: Sitting, Cuff Size: Normal)   Pulse 67   Ht 6\' 1"  (1.854 m)   Wt 203 lb 4 oz (92.2 kg)   BMI 26.82 kg/m  , BMI Body mass index is 26.82 kg/m. GEN: Well nourished, well developed, in no acute distress  HEENT: normal  Neck: no JVD, carotid bruits, or masses Cardiac: RRR; no murmurs, rubs, or gallops,no edema  Respiratory:  clear to auscultation bilaterally, normal work of breathing GI: soft, nontender, nondistended, + BS MS: no deformity or atrophy  Skin: warm and dry, no rash Neuro:  Strength and sensation are intact Psych: euthymic mood, full affect   EKG:  EKG is ordered today. The ekg ordered today demonstrates normal sinus rhythm with nonspecific T wave changes.   Recent Labs: No  results found for requested labs within last 8760 hours.    Lipid Panel No results found for: CHOL, TRIG, HDL, CHOLHDL, VLDL, LDLCALC, LDLDIRECT    Wt Readings from Last 3 Encounters:  11/25/18 203 lb 4 oz (92.2 kg)  05/07/15 193 lb (87.5 kg)       PAD Screen 11/25/2018  Previous PAD dx? No  Previous surgical procedure? No  Pain with walking? Yes  Subsides with rest? No  Feet/toe relief with dangling? No  Painful, non-healing ulcers? No  Extremities discolored? No      ASSESSMENT AND PLAN:  1.  Coronary artery disease involving native coronary arteries without angina: He is doing well overall.  I recommend continuing medical therapy.   2.  Essential hypertension: Blood pressure is well controlled on current medications.  3.  Hyperlipidemia: Continue high-dose rosuvastatin with a target LDL of less than 70.  I reviewed most recent lipid profile which showed a triglyceride of 44 and LDL of 63.  4.  Tobacco use: I discussed the importance of smoking cessation.    Disposition:   FU with me in 6 months  Signed,  Kathlyn Sacramento, MD  11/29/2018 1:45 PM    [place]

## 2018-11-29 ENCOUNTER — Encounter: Payer: Self-pay | Admitting: Cardiovascular Disease

## 2019-10-03 DIAGNOSIS — R7303 Prediabetes: Secondary | ICD-10-CM | POA: Insufficient documentation

## 2019-10-03 DIAGNOSIS — M1990 Unspecified osteoarthritis, unspecified site: Secondary | ICD-10-CM | POA: Insufficient documentation

## 2020-02-21 ENCOUNTER — Telehealth: Payer: Self-pay | Admitting: Cardiovascular Disease

## 2020-02-21 NOTE — Telephone Encounter (Signed)
° °  [facility] Pre-operative Risk Assessment    Request for surgical clearance:  1. What type of surgery is being performed? Medial Branch Blocks  2. When is this surgery scheduled? 03/05/20 and 03/19/20  3. What type of clearance is required (medical clearance vs. Pharmacy clearance to hold med vs. Both)? both  4. Are there any medications that need to be held prior to surgery and how long? Stop Plavix x 7 days prior  5. Practice name and name of physician performing surgery? [facility] - Dr Elta Guadeloupe Phillips/Dr Corinna Capra  6. What is your office phone number [phone]   7.   What is your office fax number [phone]  8.   Anesthesia type (None, local, MAC, general) ? Not listed    Joel Howe 02/21/2020, 3:25 PM  _________________________________________________________________   (provider comments below)

## 2020-02-21 NOTE — Telephone Encounter (Signed)
Primary Cardiologist:No primary care provider on file.  Chart reviewed as part of pre-operative protocol coverage. Because of Joel Howe's past medical history and time since last visit, he/she will require a follow-up visit in order to better assess preoperative cardiovascular risk.  Pre-op covering staff: - Please schedule appointment and call patient to inform them. - Please contact requesting surgeon's office via preferred method (i.e, phone, fax) to inform them of need for appointment prior to surgery.  If applicable, this message will also be routed to pharmacy pool and/or primary cardiologist for input on holding anticoagulant/antiplatelet agent as requested below so that this information is available at time of patient's appointment.   Jory Sims, NP  02/21/2020, 4:02 PM

## 2020-02-22 NOTE — Telephone Encounter (Signed)
[place] office has been trying to help set up appt pre op clearance appt for the pt, though no vm set up.

## 2020-02-23 NOTE — Telephone Encounter (Signed)
Tried to reach the pt as well to schedule pre op appt, no answer and vm not set up. Could not lmom.

## 2020-02-28 NOTE — Telephone Encounter (Signed)
Per Tomasita Crumble at [facility] :  Patient declined APP clearance appt and states he is going to hold his blood thinners for procedure and see Dr. Fletcher Anon in July .

## 2020-02-29 ENCOUNTER — Ambulatory Visit: Payer: Medicare HMO | Admitting: Nurse Practitioner

## 2020-04-25 ENCOUNTER — Ambulatory Visit: Payer: Medicare HMO | Admitting: Cardiovascular Disease

## 2020-05-17 ENCOUNTER — Encounter: Payer: Self-pay | Admitting: Urology

## 2020-06-11 ENCOUNTER — Ambulatory Visit: Payer: Medicare HMO | Admitting: Cardiovascular Disease

## 2020-06-12 ENCOUNTER — Ambulatory Visit: Payer: Self-pay | Admitting: Urology

## 2020-06-23 DIAGNOSIS — F1120 Opioid dependence, uncomplicated: Secondary | ICD-10-CM | POA: Insufficient documentation

## 2020-06-23 DIAGNOSIS — G8929 Other chronic pain: Secondary | ICD-10-CM | POA: Insufficient documentation

## 2020-06-23 DIAGNOSIS — Z951 Presence of aortocoronary bypass graft: Secondary | ICD-10-CM | POA: Insufficient documentation

## 2020-06-23 DIAGNOSIS — E785 Hyperlipidemia, unspecified: Secondary | ICD-10-CM | POA: Insufficient documentation

## 2020-06-23 DIAGNOSIS — Z8659 Personal history of other mental and behavioral disorders: Secondary | ICD-10-CM | POA: Insufficient documentation

## 2020-06-23 DIAGNOSIS — I10 Essential (primary) hypertension: Secondary | ICD-10-CM | POA: Insufficient documentation

## 2020-06-23 DIAGNOSIS — M545 Low back pain, unspecified: Secondary | ICD-10-CM | POA: Insufficient documentation

## 2020-06-23 DIAGNOSIS — K551 Chronic vascular disorders of intestine: Secondary | ICD-10-CM | POA: Insufficient documentation

## 2020-06-23 DIAGNOSIS — F419 Anxiety disorder, unspecified: Secondary | ICD-10-CM | POA: Insufficient documentation

## 2020-06-23 DIAGNOSIS — I2510 Atherosclerotic heart disease of native coronary artery without angina pectoris: Secondary | ICD-10-CM | POA: Insufficient documentation

## 2020-06-23 DIAGNOSIS — F17201 Nicotine dependence, unspecified, in remission: Secondary | ICD-10-CM | POA: Insufficient documentation

## 2020-07-09 ENCOUNTER — Encounter: Payer: Self-pay | Admitting: Cardiovascular Disease

## 2020-07-09 ENCOUNTER — Other Ambulatory Visit: Payer: Self-pay

## 2020-07-09 ENCOUNTER — Ambulatory Visit: Payer: Medicare HMO | Admitting: Cardiovascular Disease

## 2020-07-09 VITALS — BP 148/100 | HR 88 | Ht 73.0 in | Wt 209.5 lb

## 2020-07-09 DIAGNOSIS — I10 Essential (primary) hypertension: Secondary | ICD-10-CM

## 2020-07-09 DIAGNOSIS — Z720 Tobacco use: Secondary | ICD-10-CM | POA: Diagnosis not present

## 2020-07-09 DIAGNOSIS — I2510 Atherosclerotic heart disease of native coronary artery without angina pectoris: Secondary | ICD-10-CM

## 2020-07-09 DIAGNOSIS — E785 Hyperlipidemia, unspecified: Secondary | ICD-10-CM | POA: Diagnosis not present

## 2020-07-09 MED ORDER — AMLODIPINE BESYLATE 5 MG PO TABS: 5.0000 mg | ORAL_TABLET | Freq: Every day | ORAL | 3 refills | Status: DC

## 2020-07-09 NOTE — Patient Instructions (Signed)
Medication Instructions:  Your physician has recommended you make the following change in your medication:   START Amlodipine 5 mg daily. An Rx has been sent to your pharmacy.   *If you need a refill on your cardiac medications before your next appointment, please call your pharmacy*   Lab Work: None ordered If you have labs (blood work) drawn today and your tests are completely normal, you will receive your results only by: Marland Kitchen MyChart Message (if you have MyChart) OR . A paper copy in the mail If you have any lab test that is abnormal or we need to change your treatment, we will call you to review the results.   Testing/Procedures: None ordered   Follow-Up: At [facility], you and your health needs are our priority.  As part of our continuing mission to provide you with exceptional heart care, we have created designated Provider Care Teams.  These Care Teams include your primary Cardiologist (physician) and Advanced Practice Providers (APPs -  Physician Assistants and Nurse Practitioners) who all work together to provide you with the care you need, when you need it.  We recommend signing up for the patient portal called "MyChart".  Sign up information is provided on this After Visit Summary.  MyChart is used to connect with patients for Virtual Visits (Telemedicine).  Patients are able to view lab/test results, encounter notes, upcoming appointments, etc.  Non-urgent messages can be sent to your provider as well.   To learn more about what you can do with MyChart, go to [email or web address].    Your next appointment:   4 month(s)  The format for your next appointment:   In Person  Provider:   You may see Dr. Fletcher Anon or one of the following Advanced Practice Providers on your designated Care Team:    Murray Hodgkins, NP  Christell Faith, PA-C  Marrianne Mood, PA-C  Cadence Kathlen Mody, [place]    Other Instructions N/A

## 2020-07-09 NOTE — Progress Notes (Signed)
Cardiology Office Note   Date:  07/09/2020   ID:  Joel Howe, DOB 01-Jun-1956, MRN [id number]  PCP:  Jodi Marble, MD  Cardiologist:   Kathlyn Sacramento, MD   Chief Complaint  Patient presents with  . OTHER    6 month f/u no complaints today. Meds reviewed verbally with pt.      History of Present Illness: Joel Howe is a 64 y.o. male who presents for a follow-up visit regarding coronary artery disease.    He has known history of coronary artery disease status post CABG in 2007 at [facility], hypertension, tobacco use, chronic pain on narcotics and hyperlipidemia. Most recent cardiac catheterization in 2016 showed significant three-vessel coronary artery disease with patent grafts including LIMA to LAD, SVG to OM and SVG to right PDA.  There was 50 to 60% stenosis in ostial SVG to right PDA. He was recently hospitalized at [facility] with blood in stool and was suspected of having ischemic colitis.  He was hypotensive on presentation but responded to IV fluids.  They reported excessive alcohol use.  The patient reports drinking some vodka before that after the death of his mom.  His blood pressure was then very elevated and was switched from metoprolol to carvedilol.  He is feeling better with resolution of abdominal pain and blood in stool.  No chest pain or shortness of breath.  He quit smoking 6 weeks ago.    Past Medical History:  Diagnosis Date  . Anginal pain ([place])   . Anxiety   . Arthritis   . Chronic back pain   . Coronary artery disease    CABG in 2007 at [facility] with LIMA to LAD, SVG to OM and SVG to right PDA  . Hyperlipidemia   . Hypertension   . Lyme disease   . Tobacco use     Past Surgical History:  Procedure Laterality Date  . CARDIAC CATHETERIZATION N/A 05/07/2015   Procedure: Left Heart Cath;  Surgeon: Dionisio David, MD;  Location: [place] [facility];  Service: Cardiovascular;  Laterality: N/A;  . CORONARY ANGIOPLASTY       Current Outpatient Medications   Medication Sig Dispense Refill  . ALPRAZolam (XANAX) 1 MG tablet Take 1 mg by mouth 3 (three) times daily.    . Ascorbic Acid (VITAMIN C) 1000 MG tablet Take 1,000 mg by mouth daily.    . budesonide-formoterol (SYMBICORT) 80-4.5 MCG/ACT inhaler Inhale 2 puffs into the lungs as needed.    . carvedilol (COREG) 25 MG tablet Take 25 mg by mouth 2 (two) times daily with a meal.    . Cholecalciferol (VITAMIN D) 50 MCG (2000 UT) CAPS Take by mouth daily.    . clopidogrel (PLAVIX) 75 MG tablet Take 75 mg by mouth daily.    . cyclobenzaprine (FLEXERIL) 10 MG tablet Take 10 mg by mouth 3 (three) times daily as needed for muscle spasms.    Marland Kitchen escitalopram (LEXAPRO) 20 MG tablet Take 20 mg by mouth daily.    Marland Kitchen esomeprazole (NEXIUM) 20 MG capsule Take 20 mg by mouth daily at 12 noon.    Marland Kitchen levocetirizine (XYZAL) 5 MG tablet Take 5 mg by mouth as needed for allergies.    Marland Kitchen lisinopril (PRINIVIL,ZESTRIL) 40 MG tablet Take 40 mg by mouth daily.    . Melatonin 10 MG TABS Take by mouth at bedtime.    Marland Kitchen morphine (MS CONTIN) 60 MG 12 hr tablet 2 (two) times daily.    Marland Kitchen  Multiple Vitamin (MULTIVITAMIN) capsule Take 1 capsule by mouth daily.    Marland Kitchen oxyCODONE-acetaminophen (PERCOCET) 10-325 MG per tablet Take 1 tablet by mouth every 6 (six) hours as needed for pain.    . polyethylene glycol (MIRALAX / GLYCOLAX) 17 g packet Take 17 g by mouth daily as needed.    . rosuvastatin (CRESTOR) 40 MG tablet Take 40 mg by mouth daily.    Marland Kitchen senna (SENOKOT) 8.6 MG tablet Take 2 tablets by mouth daily.     No current facility-administered medications for this visit.    Allergies:   Morphine and related and Propranolol    Social History:  The patient  reports that he quit smoking today. His smoking use included cigarettes. He has a 45.00 pack-year smoking history. He has never used smokeless tobacco. He reports that he does not drink alcohol and does not use drugs.   Family History:  The patient's family history includes Heart  attack in his brother, brother, father, mother, and sister.    ROS:  Please see the history of present illness.   Otherwise, review of systems are positive for none.   All other systems are reviewed and negative.    PHYSICAL EXAM: VS:  BP (!) 148/100 (BP Location: Left Arm, Patient Position: Sitting, Cuff Size: Normal)   Pulse 88   Ht 6\' 1"  (1.854 m)   Wt 209 lb 8 oz (95 kg)   SpO2 97%   BMI 27.64 kg/m  , BMI Body mass index is 27.64 kg/m. GEN: Well nourished, well developed, in no acute distress  HEENT: normal  Neck: no JVD, carotid bruits, or masses Cardiac: RRR; no murmurs, rubs, or gallops,no edema  Respiratory:  clear to auscultation bilaterally, normal work of breathing GI: soft, nontender, nondistended, + BS MS: no deformity or atrophy  Skin: warm and dry, no rash Neuro:  Strength and sensation are intact Psych: euthymic mood, full affect   EKG:  EKG is ordered today. The ekg ordered today demonstrates normal sinus rhythm with nonspecific T wave changes.   Recent Labs: No results found for requested labs within last 8760 hours.    Lipid Panel No results found for: CHOL, TRIG, HDL, CHOLHDL, VLDL, LDLCALC, LDLDIRECT    Wt Readings from Last 3 Encounters:  07/09/20 209 lb 8 oz (95 kg)  11/25/18 203 lb 4 oz (92.2 kg)  05/07/15 193 lb (87.5 kg)       PAD Screen 11/25/2018  Previous PAD dx? No  Previous surgical procedure? No  Pain with walking? Yes  Subsides with rest? No  Feet/toe relief with dangling? No  Painful, non-healing ulcers? No  Extremities discolored? No      ASSESSMENT AND PLAN:  1.  Coronary artery disease involving native coronary arteries without angina: He is doing well overall.  I recommend continuing medical therapy.   2.  Essential hypertension: Blood pressure continues to be elevated in spite of switching metoprolol to carvedilol recently.  I elected to add amlodipine 5 mg once daily.  3.  Hyperlipidemia: Continue high-dose  rosuvastatin with a target LDL of less than 70.  I reviewed most recent lipid profile which showed a triglyceride of 44 and LDL of 63.  4.  Tobacco use: The patient quit smoking 6 weeks ago.  5.  Alcohol use.  He denies excessive use and I did discuss with him the importance of moderation.   Disposition:   FU with me in 4 months  Signed,  Kathlyn Sacramento, MD  07/09/2020 2:13 PM    [facility]

## 2020-07-30 ENCOUNTER — Other Ambulatory Visit: Payer: Self-pay

## 2020-07-30 MED ORDER — CARVEDILOL 25 MG PO TABS: 25.0000 mg | ORAL_TABLET | Freq: Two times a day (BID) | ORAL | 3 refills | Status: DC

## 2020-08-27 ENCOUNTER — Encounter: Payer: Self-pay | Admitting: Internal Medicine

## 2020-09-26 ENCOUNTER — Telehealth: Payer: Self-pay | Admitting: Cardiovascular Disease

## 2020-09-26 ENCOUNTER — Telehealth (INDEPENDENT_AMBULATORY_CARE_PROVIDER_SITE_OTHER): Payer: Medicare HMO | Admitting: Cardiovascular Disease

## 2020-09-26 ENCOUNTER — Encounter: Payer: Self-pay | Admitting: Cardiovascular Disease

## 2020-09-26 VITALS — Ht 73.0 in | Wt 214.0 lb

## 2020-09-26 DIAGNOSIS — E785 Hyperlipidemia, unspecified: Secondary | ICD-10-CM | POA: Diagnosis not present

## 2020-09-26 DIAGNOSIS — I2510 Atherosclerotic heart disease of native coronary artery without angina pectoris: Secondary | ICD-10-CM | POA: Diagnosis not present

## 2020-09-26 DIAGNOSIS — Z720 Tobacco use: Secondary | ICD-10-CM | POA: Diagnosis not present

## 2020-09-26 DIAGNOSIS — I10 Essential (primary) hypertension: Secondary | ICD-10-CM | POA: Diagnosis not present

## 2020-09-26 DIAGNOSIS — J018 Other acute sinusitis: Secondary | ICD-10-CM

## 2020-09-26 MED ORDER — AMOXICILLIN-POT CLAVULANATE 875-125 MG PO TABS: 1.0000 | ORAL_TABLET | Freq: Two times a day (BID) | ORAL | 0 refills | Status: AC

## 2020-09-26 NOTE — Patient Instructions (Signed)
Medication Instructions:  Your physician has recommended you make the following change in your medication:   START Augmentin 875/125 mg twice daily for 5 days. An Rx has been sent to your pharmacy.   *If you need a refill on your cardiac medications before your next appointment, please call your pharmacy*   Lab Work: None ordered If you have labs (blood work) drawn today and your tests are completely normal, you will receive your results only by: Marland Kitchen MyChart Message (if you have MyChart) OR . A paper copy in the mail If you have any lab test that is abnormal or we need to change your treatment, we will call you to review the results.   Testing/Procedures: None ordered   Follow-Up: At [facility], you and your health needs are our priority.  As part of our continuing mission to provide you with exceptional heart care, we have created designated Provider Care Teams.  These Care Teams include your primary Cardiologist (physician) and Advanced Practice Providers (APPs -  Physician Assistants and Nurse Practitioners) who all work together to provide you with the care you need, when you need it.  We recommend signing up for the patient portal called "MyChart".  Sign up information is provided on this After Visit Summary.  MyChart is used to connect with patients for Virtual Visits (Telemedicine).  Patients are able to view lab/test results, encounter notes, upcoming appointments, etc.  Non-urgent messages can be sent to your provider as well.   To learn more about what you can do with MyChart, go to [email or web address].    Your next appointment:   4 month(s)  The format for your next appointment:   In Person  Provider:   You may see Kathlyn Sacramento, MD or one of the following Advanced Practice Providers on your designated Care Team:    Murray Hodgkins, NP  Christell Faith, PA-C  Marrianne Mood, PA-C  Cadence Tilden, [place]  Laurann Montana, NP    Other Instructions N/A

## 2020-09-26 NOTE — Progress Notes (Signed)
Virtual Visit via Telephone Note   This visit type was conducted due to national recommendations for restrictions regarding the COVID-19 Pandemic (e.g. social distancing) in an effort to limit this patient's exposure and mitigate transmission in our community.  Due to his co-morbid illnesses, this patient is at least at moderate risk for complications without adequate follow up.  This format is felt to be most appropriate for this patient at this time.  The patient did not have access to video technology/had technical difficulties with video requiring transitioning to audio format only (telephone).  All issues noted in this document were discussed and addressed.  No physical exam could be performed with this format.  Please refer to the patient's chart for his  consent to telehealth for [facility].    Date:  09/26/2020   ID:  Joel Howe, DOB 12/16/1955, MRN [id number] The patient was identified using 2 identifiers.  Patient Location: Home Provider Location: Office/Clinic  PCP:  Jodi Marble, MD  Cardiologist:  Kathlyn Sacramento, MD  Electrophysiologist:  None   Evaluation Performed:  Follow-Up Visit  Chief Complaint: Sinus congestion.  History of Present Illness:    Joel Howe is a 64 y.o. male who was reached via phone for a follow-up visit regarding coronary artery disease.  This was supposed to be an office visit but given that he is sick, it was changed to a phone encounter. He has known history of coronary artery disease status post CABG in 2007 at [facility], hypertension, tobacco use, chronic pain on narcotics and hyperlipidemia. Most recent cardiac catheterization in 2016 showed significant three-vessel coronary artery disease with patent grafts including LIMA to LAD, SVG to OM and SVG to right PDA.  There was 50 to 60% stenosis in ostial SVG to right PDA. He was hospitalized in September at [facility] with blood in stool and was suspected of having ischemic colitis.  He was  hypotensive on presentation but responded to IV fluids.  They reported excessive alcohol use.  The patient reports drinking some vodka before that after the death of his mom.  His blood pressure was then very elevated and was switched from metoprolol to carvedilol.   During most recent visit with me in September, he was still hypertensive and I added amlodipine.  Since then, he reports improvement in blood pressure and has been doing well with no chest pain or shortness of breath. Over the last 2 days, he started having severe sinus congestion with yellow/greenish discharge and redness of his eyes with pressure behind his eyes.  He denies any fever.  He does have some body aches.     Past Medical History:  Diagnosis Date  . Anginal pain ([place])   . Anxiety   . Arthritis   . Chronic back pain   . Coronary artery disease    CABG in 2007 at [facility] with LIMA to LAD, SVG to OM and SVG to right PDA  . Hyperlipidemia   . Hypertension   . Lyme disease   . Tobacco use    Past Surgical History:  Procedure Laterality Date  . CARDIAC CATHETERIZATION N/A 05/07/2015   Procedure: Left Heart Cath;  Surgeon: Dionisio David, MD;  Location: [facility];  Service: Cardiovascular;  Laterality: N/A;  . CORONARY ANGIOPLASTY       Current Meds  Medication Sig  . ALPRAZolam (XANAX) 1 MG tablet Take 1 mg by mouth 3 (three) times daily.  Marland Kitchen amLODipine (NORVASC) 5 MG tablet  Take 1 tablet (5 mg total) by mouth daily.  . Ascorbic Acid (VITAMIN C) 1000 MG tablet Take 1,000 mg by mouth daily.  . budesonide-formoterol (SYMBICORT) 80-4.5 MCG/ACT inhaler Inhale 2 puffs into the lungs as needed.  . carvedilol (COREG) 25 MG tablet Take 1 tablet (25 mg total) by mouth 2 (two) times daily with a meal.  . Cholecalciferol (VITAMIN D) 50 MCG (2000 UT) CAPS Take by mouth daily.  . clopidogrel (PLAVIX) 75 MG tablet Take 75 mg by mouth daily.  . cyclobenzaprine (FLEXERIL) 10 MG tablet Take 10 mg by mouth 3 (three) times  daily as needed for muscle spasms.  Marland Kitchen escitalopram (LEXAPRO) 20 MG tablet Take 20 mg by mouth daily.  Marland Kitchen esomeprazole (NEXIUM) 20 MG capsule Take 20 mg by mouth daily at 12 noon.  Marland Kitchen levocetirizine (XYZAL) 5 MG tablet Take 5 mg by mouth as needed for allergies.  Marland Kitchen lisinopril (PRINIVIL,ZESTRIL) 40 MG tablet Take 40 mg by mouth daily.  . Melatonin 10 MG TABS Take by mouth at bedtime.  Marland Kitchen morphine (MS CONTIN) 60 MG 12 hr tablet 2 (two) times daily.  . Multiple Vitamin (MULTIVITAMIN) capsule Take 1 capsule by mouth daily.  Marland Kitchen oxyCODONE-acetaminophen (PERCOCET) 10-325 MG per tablet Take 1 tablet by mouth every 6 (six) hours as needed for pain.  . polyethylene glycol (MIRALAX / GLYCOLAX) 17 g packet Take 17 g by mouth daily as needed.  . rosuvastatin (CRESTOR) 40 MG tablet Take 40 mg by mouth daily.  Marland Kitchen senna (SENOKOT) 8.6 MG tablet Take 2 tablets by mouth 2 (two) times daily.     Allergies:   Morphine and related and Propranolol   Social History   Tobacco Use  . Smoking status: Former Smoker    Packs/day: 1.00    Years: 45.00    Pack years: 45.00    Types: Cigarettes    Quit date: 07/09/2020    Years since quitting: 0.2  . Smokeless tobacco: Never Used  Vaping Use  . Vaping Use: Every day  Substance Use Topics  . Alcohol use: No  . Drug use: No     Family Hx: The patient's family history includes Heart attack in his brother, brother, father, mother, and sister.  ROS:   Please see the history of present illness.     All other systems reviewed and are negative.   Prior CV studies:   The following studies were reviewed today:    Labs/Other Tests and Data Reviewed:    EKG:  No ECG reviewed.  Recent Labs: No results found for requested labs within last 8760 hours.   Recent Lipid Panel No results found for: CHOL, TRIG, HDL, CHOLHDL, LDLCALC, LDLDIRECT  Wt Readings from Last 3 Encounters:  09/26/20 214 lb (97.1 kg)  07/09/20 209 lb 8 oz (95 kg)  11/25/18 203 lb 4 oz (92.2  kg)     Risk Assessment/Calculations:      Objective:    Vital Signs:  Ht 6\' 1"  (1.854 m)   Wt 214 lb (97.1 kg)   BMI 28.23 kg/m    VITAL SIGNS:  reviewed  ASSESSMENT & PLAN:    1.  Coronary artery disease involving native coronary arteries without angina: He is doing well overall.  I recommend continuing medical therapy.   2.  Essential hypertension: Blood pressure improved after the addition of amlodipine.  3.  Hyperlipidemia: Continue high-dose rosuvastatin with a target LDL of less than 70.  I reviewed most recent lipid profile which  showed a triglyceride of 44 and LDL of 63.  4.  Tobacco use: The patient quit smoking 6 weeks ago.  5.  Acute sinusitis: He has symptoms suggestive of sinusitis.  Symptoms are not classic for Covid or the flu.  I elected to give him a 5-day course of Augmentin.  I advised him to seek medical attention from his primary care physician or urgent care if no improvement in symptoms in few days.        COVID-19 Education: The signs and symptoms of COVID-19 were discussed with the patient and how to seek care for testing (follow up with PCP or arrange E-visit).  The importance of social distancing was discussed today.  Time:   Today, I have spent 10 minutes with the patient with telehealth technology discussing the above problems.     Medication Adjustments/Labs and Tests Ordered: Current medicines are reviewed at length with the patient today.  Concerns regarding medicines are outlined above.   Tests Ordered: No orders of the defined types were placed in this encounter.   Medication Changes: No orders of the defined types were placed in this encounter.   Follow Up:  In Person in 4 month(s)  Signed, Kathlyn Sacramento, MD  09/26/2020 9:48 AM    [place]

## 2020-09-26 NOTE — Telephone Encounter (Signed)
°  Patient Consent for Virtual Visit         Joel Howe has provided verbal consent on 09/26/2020 for a virtual visit (video or telephone).   CONSENT FOR VIRTUAL VISIT FOR:  Joel Howe  By participating in this virtual visit I agree to the following:  I hereby voluntarily request, consent and authorize [facility] and its employed or contracted physicians, physician assistants, nurse practitioners or other licensed health care professionals (the Practitioner), to provide me with telemedicine health care services (the Services") as deemed necessary by the treating Practitioner. I acknowledge and consent to receive the Services by the Practitioner via telemedicine. I understand that the telemedicine visit will involve communicating with the Practitioner through live audiovisual communication technology and the disclosure of certain medical information by electronic transmission. I acknowledge that I have been given the opportunity to request an in-person assessment or other available alternative prior to the telemedicine visit and am voluntarily participating in the telemedicine visit.  I understand that I have the right to withhold or withdraw my consent to the use of telemedicine in the course of my care at any time, without affecting my right to future care or treatment, and that the Practitioner or I may terminate the telemedicine visit at any time. I understand that I have the right to inspect all information obtained and/or recorded in the course of the telemedicine visit and may receive copies of available information for a reasonable fee.  I understand that some of the potential risks of receiving the Services via telemedicine include:   Delay or interruption in medical evaluation due to technological equipment failure or disruption;  Information transmitted may not be sufficient (e.g. poor resolution of images) to allow for appropriate medical decision making by the Practitioner;  and/or   In rare instances, security protocols could fail, causing a breach of personal health information.  Furthermore, I acknowledge that it is my responsibility to provide information about my medical history, conditions and care that is complete and accurate to the best of my ability. I acknowledge that Practitioner's advice, recommendations, and/or decision may be based on factors not within their control, such as incomplete or inaccurate data provided by me or distortions of diagnostic images or specimens that may result from electronic transmissions. I understand that the practice of medicine is not an exact science and that Practitioner makes no warranties or guarantees regarding treatment outcomes. I acknowledge that a copy of this consent can be made available to me via my patient portal ([place]), or I can request a printed copy by calling the office of [facility].    I understand that my insurance will be billed for this visit.   I have read or had this consent read to me.  I understand the contents of this consent, which adequately explains the benefits and risks of the Services being provided via telemedicine.   I have been provided ample opportunity to ask questions regarding this consent and the Services and have had my questions answered to my satisfaction.  I give my informed consent for the services to be provided through the use of telemedicine in my medical care

## 2020-11-05 ENCOUNTER — Other Ambulatory Visit: Payer: Self-pay

## 2020-11-05 ENCOUNTER — Ambulatory Visit: Payer: Medicare HMO | Admitting: Gastroenterology

## 2020-11-05 ENCOUNTER — Encounter: Payer: Self-pay | Admitting: Gastroenterology

## 2020-11-05 VITALS — BP 151/92 | HR 85 | Ht 73.0 in | Wt 221.8 lb

## 2020-11-05 DIAGNOSIS — K559 Vascular disorder of intestine, unspecified: Secondary | ICD-10-CM

## 2020-11-05 DIAGNOSIS — R109 Unspecified abdominal pain: Secondary | ICD-10-CM | POA: Diagnosis not present

## 2020-11-05 NOTE — Progress Notes (Signed)
Gastroenterology Consultation  Referring Provider:     Jodi Marble, MD Primary Care Physician:  Jodi Marble, MD Primary Gastroenterologist:  Dr. Allen Norris     Reason for Consultation:     Abdominal pain        HPI:   Joel Howe is a 65 y.o. y/o male referred for consultation & management of Abdominal pain by Dr. Jodi Marble, MD.  This patient comes in today with a history of being in [facility] for ischemic colitis.  The patient states that he was told that there was a teem standing by for possible surgery since there was so much inflammation on his colon.  The patient states that he was at home whereupon he had a large bowel with lots of blood and EMS was called.  The patient had fallen off the commode and was laying on the floor when EMS came and brought him to [facility].  He underwent a flexible sigmoidoscopy with biopsies showing findings consistent with ischemia.  The patient reports that he has not had any further bouts of diarrhea since that attack but reports that he has lower abdominal pain quite frequently that results in nausea.  He will have a bowel movement usually in the morning that relieves the pain but he states that the nausea continues to bother him at times.  The patient has taken some over-the-counter stool softeners and reports that it has helped to move his bowels.  There is no report of any unexplained weight loss in the patient states he is actually gained weight.  It is been since approximately 2008 since his last colonoscopy.  The patient CT scan at [facility] showed wall thickening with inflammatory stranding about the descending and sigmoid colon. His white cell count was elevated reaching a peak of 21.2 during the hospital stay.  Past Medical History:  Diagnosis Date  . Anginal pain ([place])   . Anxiety   . Arthritis   . Chronic back pain   . Coronary artery disease    CABG in 2007 at [facility] with LIMA  to LAD, SVG to OM and SVG to right PDA  . Hyperlipidemia   . Hypertension   . Lyme disease   . Tobacco use     Past Surgical History:  Procedure Laterality Date  . CARDIAC CATHETERIZATION N/A 05/07/2015   Procedure: Left Heart Cath;  Surgeon: Dionisio David, MD;  Location: [place] [facility];  Service: Cardiovascular;  Laterality: N/A;  . CORONARY ANGIOPLASTY      Prior to Admission medications   Medication Sig Start Date End Date Taking? Authorizing Provider  ALPRAZolam Duanne Moron) 1 MG tablet Take 1 mg by mouth 3 (three) times daily.   Yes [provider]  Ascorbic Acid (VITAMIN C) 1000 MG tablet Take 1,000 mg by mouth daily.   Yes [provider]  budesonide-formoterol (SYMBICORT) 80-4.5 MCG/ACT inhaler Inhale 2 puffs into the lungs as needed.   Yes [provider]  carvedilol (COREG) 25 MG tablet Take 1 tablet (25 mg total) by mouth 2 (two) times daily with a meal. 07/30/20  Yes Wellington Hampshire, MD  Cholecalciferol (VITAMIN D) 50 MCG (2000 UT) CAPS Take by mouth daily.   Yes [provider]  clopidogrel (PLAVIX) 75 MG tablet Take 75 mg by mouth daily.   Yes [provider]  cyclobenzaprine (FLEXERIL) 10 MG tablet Take 10 mg by mouth 3 (three) times daily as needed  for muscle spasms.   Yes [provider]  escitalopram (LEXAPRO) 20 MG tablet Take 20 mg by mouth daily.   Yes [provider]  esomeprazole (NEXIUM) 20 MG capsule Take 20 mg by mouth daily at 12 noon.   Yes [provider]  hydrochlorothiazide (HYDRODIURIL) 25 MG tablet Take 25 mg by mouth daily. 08/05/20  Yes [provider]  levocetirizine (XYZAL) 5 MG tablet Take 5 mg by mouth as needed for allergies.   Yes [provider]  lisinopril (PRINIVIL,ZESTRIL) 40 MG tablet Take 40 mg by mouth daily.   Yes [provider]  Melatonin 10 MG TABS Take by mouth at bedtime.   Yes [provider]  metoprolol tartrate (LOPRESSOR)  100 MG tablet Take 100 mg by mouth 2 (two) times daily. 08/08/20  Yes [provider]  morphine (MS CONTIN) 60 MG 12 hr tablet 2 (two) times daily. 11/01/18  Yes [provider]  Multiple Vitamin (MULTIVITAMIN) capsule Take 1 capsule by mouth daily.   Yes [provider]  Multiple Vitamins-Minerals (CENTRUM MEN) TABS Take 1 tablet by mouth every morning. 03/23/18  Yes [provider]  oxyCODONE-acetaminophen (PERCOCET) 10-325 MG per tablet Take 1 tablet by mouth every 6 (six) hours as needed for pain.   Yes [provider]  polyethylene glycol (MIRALAX / GLYCOLAX) 17 g packet Take 17 g by mouth daily as needed.   Yes [provider]  rOPINIRole (REQUIP) 0.25 MG tablet Take 0.25 mg by mouth 2 (two) times daily. 08/23/20  Yes [provider]  rosuvastatin (CRESTOR) 40 MG tablet Take 40 mg by mouth daily.   Yes [provider]  senna (SENOKOT) 8.6 MG tablet Take 2 tablets by mouth 2 (two) times daily.   Yes [provider]  SENNA-PLUS 8.6-50 MG tablet Take by mouth. 08/23/20  Yes [provider]  amLODipine (NORVASC) 5 MG tablet Take 1 tablet (5 mg total) by mouth daily. 07/09/20 10/07/20  Wellington Hampshire, MD    Family History  Problem Relation Age of Onset  . Heart attack Mother   . Heart attack Father   . Heart attack Sister   . Heart attack Brother   . Heart attack Brother      Social History   Tobacco Use  . Smoking status: Former Smoker    Packs/day: 1.00    Years: 45.00    Pack years: 45.00    Types: Cigarettes    Quit date: 07/09/2020    Years since quitting: 0.3  . Smokeless tobacco: Never Used  Vaping Use  . Vaping Use: Every day  Substance Use Topics  . Alcohol use: No  . Drug use: No    Allergies as of 11/05/2020 - Review Complete 11/05/2020  Allergen Reaction Noted  . Morphine and related Other (See Comments) 05/07/2015  . Propranolol Hives 11/25/2018    Review of Systems:     All systems reviewed and negative except where noted in HPI.   Physical Exam:  BP (!) 151/92   Pulse 85   Ht 6\' 1"  (1.854 m)   Wt 221 lb 12.8 oz (100.6 kg)   BMI 29.26 kg/m  No LMP for male patient. General:   Alert,  Well-developed, well-nourished, pleasant and cooperative in NAD Head:  Normocephalic and atraumatic. Eyes:  Sclera clear, no icterus.   Conjunctiva pink. Ears:  Normal auditory acuity. Neck:  Supple; no masses or thyromegaly. Lungs:  Respirations even and unlabored.  Clear throughout  to auscultation.   No wheezes, crackles, or rhonchi. No acute distress. Heart:  Regular rate and rhythm; no murmurs, clicks, rubs, or gallops. Abdomen:  Normal bowel sounds.  No bruits.  Soft, non-tender and non-distended without masses, hepatosplenomegaly or hernias noted.  No guarding or rebound tenderness.  Negative Carnett sign.   Rectal:  Deferred.  Pulses:  Normal pulses noted. Extremities:  No clubbing or edema.  No cyanosis. Neurologic:  Alert and oriented x3;  grossly normal neurologically. Skin:  Intact without significant lesions or rashes.  No jaundice. Lymph Nodes:  No significant cervical adenopathy. Psych:  Alert and cooperative. Normal mood and affect.  Imaging Studies: No results found.  Assessment and Plan:   Joel Howe is a 65 y.o. y/o male who comes in today after a bout of ischemic colitis.  The patient has been told to stay hydrated to avoid any further ischemic events.  The patient has also been given samples of Linzess to help keep his bowels moving and avoid colonic distention which she has been told can decrease blood flow. He has also been recommended to undergo a colonoscopy since he has not had a colonoscopy many years.  The patient will follow up at the time of the colonoscopy.  The patient and his wife have been explained the plan and agree with it.    [name], MD. [facility]    Note: This dictation was prepared with Dragon dictation along with smaller  phrase technology. Any transcriptional errors that result from this process are unintentional.

## 2020-11-06 DIAGNOSIS — M150 Primary generalized (osteo)arthritis: Secondary | ICD-10-CM | POA: Diagnosis not present

## 2020-11-06 DIAGNOSIS — M47812 Spondylosis without myelopathy or radiculopathy, cervical region: Secondary | ICD-10-CM | POA: Diagnosis not present

## 2020-11-06 DIAGNOSIS — G894 Chronic pain syndrome: Secondary | ICD-10-CM | POA: Diagnosis not present

## 2020-11-06 DIAGNOSIS — M47816 Spondylosis without myelopathy or radiculopathy, lumbar region: Secondary | ICD-10-CM | POA: Diagnosis not present

## 2020-11-07 ENCOUNTER — Encounter: Payer: Self-pay | Admitting: Gastroenterology

## 2020-11-07 ENCOUNTER — Other Ambulatory Visit: Payer: Self-pay

## 2020-11-07 DIAGNOSIS — K559 Vascular disorder of intestine, unspecified: Secondary | ICD-10-CM

## 2020-11-07 DIAGNOSIS — R109 Unspecified abdominal pain: Secondary | ICD-10-CM

## 2020-11-11 ENCOUNTER — Telehealth: Payer: Self-pay

## 2020-11-11 NOTE — Telephone Encounter (Signed)
Pt's wife notified per Dr. Fletcher Anon, ok to stop Plavix 5 days prior to colonoscopy and to restart 1 day after procedure if no bleeding issues.

## 2020-11-11 NOTE — Telephone Encounter (Signed)
-----   Message from Wellington Hampshire, MD sent at 11/10/2020 11:20 AM EST ----- Patient may stop Plavix 75mg  ___5____ days prior to procedure. Patient is to restart blood thinner ___1___ days after procedure if ne bleeding issues.   Dr. Fletcher Anon   ----- Message ----- From: [place]ie [place], CMA Sent: 11/07/2020  11:02 AM EST To: Wellington Hampshire, MD

## 2020-11-14 ENCOUNTER — Other Ambulatory Visit
Admission: RE | Admit: 2020-11-14 | Discharge: 2020-11-14 | Disposition: A | Discharge status: Home / Self Care | Payer: Medicare HMO | Source: Ambulatory Visit | Attending: Gastroenterology | Admitting: Gastroenterology

## 2020-11-14 ENCOUNTER — Other Ambulatory Visit: Payer: Self-pay

## 2020-11-14 DIAGNOSIS — Z20822 Contact with and (suspected) exposure to covid-19: Secondary | ICD-10-CM | POA: Diagnosis not present

## 2020-11-14 DIAGNOSIS — Z01812 Encounter for preprocedural laboratory examination: Secondary | ICD-10-CM | POA: Insufficient documentation

## 2020-11-15 LAB — SARS CORONAVIRUS 2 (TAT 6-24 HRS): SARS Coronavirus 2: NEGATIVE

## 2020-11-15 NOTE — Discharge Instructions (Signed)

## 2020-11-18 ENCOUNTER — Other Ambulatory Visit: Payer: Self-pay

## 2020-11-18 ENCOUNTER — Ambulatory Visit: Payer: Medicare HMO | Admitting: Anesthesiology

## 2020-11-18 ENCOUNTER — Ambulatory Visit
Admission: RE | Admit: 2020-11-18 | Discharge: 2020-11-18 | Disposition: A | Discharge status: Home / Self Care | Payer: Medicare HMO | Attending: Gastroenterology | Admitting: Gastroenterology

## 2020-11-18 ENCOUNTER — Encounter: Payer: Self-pay | Admitting: Gastroenterology

## 2020-11-18 ENCOUNTER — Encounter
Admission: RE | Disposition: A | Discharge status: Home / Self Care | Payer: Self-pay | Source: Home / Self Care | Attending: Gastroenterology

## 2020-11-18 DIAGNOSIS — Z79899 Other long term (current) drug therapy: Secondary | ICD-10-CM | POA: Insufficient documentation

## 2020-11-18 DIAGNOSIS — Z8249 Family history of ischemic heart disease and other diseases of the circulatory system: Secondary | ICD-10-CM | POA: Insufficient documentation

## 2020-11-18 DIAGNOSIS — K559 Vascular disorder of intestine, unspecified: Secondary | ICD-10-CM

## 2020-11-18 DIAGNOSIS — Z7902 Long term (current) use of antithrombotics/antiplatelets: Secondary | ICD-10-CM | POA: Diagnosis not present

## 2020-11-18 DIAGNOSIS — I252 Old myocardial infarction: Secondary | ICD-10-CM | POA: Insufficient documentation

## 2020-11-18 DIAGNOSIS — Z87891 Personal history of nicotine dependence: Secondary | ICD-10-CM | POA: Insufficient documentation

## 2020-11-18 DIAGNOSIS — Z885 Allergy status to narcotic agent status: Secondary | ICD-10-CM | POA: Insufficient documentation

## 2020-11-18 DIAGNOSIS — D125 Benign neoplasm of sigmoid colon: Secondary | ICD-10-CM | POA: Diagnosis not present

## 2020-11-18 DIAGNOSIS — Z888 Allergy status to other drugs, medicaments and biological substances status: Secondary | ICD-10-CM | POA: Insufficient documentation

## 2020-11-18 DIAGNOSIS — Z8719 Personal history of other diseases of the digestive system: Secondary | ICD-10-CM | POA: Diagnosis not present

## 2020-11-18 DIAGNOSIS — Z951 Presence of aortocoronary bypass graft: Secondary | ICD-10-CM | POA: Diagnosis not present

## 2020-11-18 DIAGNOSIS — K55039 Acute (reversible) ischemia of large intestine, extent unspecified: Secondary | ICD-10-CM | POA: Diagnosis not present

## 2020-11-18 DIAGNOSIS — D122 Benign neoplasm of ascending colon: Secondary | ICD-10-CM | POA: Diagnosis not present

## 2020-11-18 DIAGNOSIS — K635 Polyp of colon: Secondary | ICD-10-CM | POA: Diagnosis not present

## 2020-11-18 DIAGNOSIS — K5289 Other specified noninfective gastroenteritis and colitis: Secondary | ICD-10-CM | POA: Diagnosis not present

## 2020-11-18 DIAGNOSIS — R109 Unspecified abdominal pain: Secondary | ICD-10-CM

## 2020-11-18 HISTORY — PX: POLYPECTOMY: SHX5525

## 2020-11-18 HISTORY — DX: Dorsopathy, unspecified: M53.9

## 2020-11-18 HISTORY — PX: COLONOSCOPY WITH PROPOFOL: SHX5780

## 2020-11-18 SURGERY — COLONOSCOPY WITH PROPOFOL
Anesthesia: General | Site: Rectum

## 2020-11-18 MED ORDER — STERILE WATER FOR IRRIGATION IR SOLN: Status: DC | PRN

## 2020-11-18 MED ORDER — ACETAMINOPHEN 325 MG PO TABS: 325.0000 mg | ORAL_TABLET | Freq: Once | ORAL | Status: DC

## 2020-11-18 MED ORDER — LACTATED RINGERS IV SOLN: INTRAVENOUS | Status: DC

## 2020-11-18 MED ORDER — PROPOFOL 10 MG/ML IV BOLUS
INTRAVENOUS | Status: DC | PRN
Start: 2020-11-18 — End: 2020-11-18
  Administered 2020-11-18 (×5): 20 mg via INTRAVENOUS
  Administered 2020-11-18: 100 mg via INTRAVENOUS
  Administered 2020-11-18 (×4): 20 mg via INTRAVENOUS

## 2020-11-18 MED ORDER — ACETAMINOPHEN 160 MG/5ML PO SOLN: 325.0000 mg | Freq: Once | ORAL | Status: DC

## 2020-11-18 MED ORDER — LIDOCAINE HCL (CARDIAC) PF 100 MG/5ML IV SOSY
PREFILLED_SYRINGE | INTRAVENOUS | Status: DC | PRN
  Administered 2020-11-18: 20 mg via INTRAVENOUS

## 2020-11-18 MED ORDER — SODIUM CHLORIDE 0.9 % IV SOLN: INTRAVENOUS | Status: DC

## 2020-11-18 SURGICAL SUPPLY — 8 items
GOWN CVR UNV OPN BCK APRN NK (MISCELLANEOUS) ×2 IMPLANT
GOWN ISOL THUMB LOOP REG UNIV (MISCELLANEOUS) ×4
KIT PRC NS LF DISP ENDO (KITS) ×1 IMPLANT
KIT PROCEDURE OLYMPUS (KITS) ×2
MANIFOLD NEPTUNE II (INSTRUMENTS) ×2 IMPLANT
SNARE COLD EXACTO (MISCELLANEOUS) ×2 IMPLANT
TRAP ETRAP POLY (MISCELLANEOUS) ×2 IMPLANT
WATER STERILE IRR 250ML POUR (IV SOLUTION) ×2 IMPLANT

## 2020-11-18 NOTE — Anesthesia Procedure Notes (Signed)
Performed by: Teller Wakefield, CRNA       

## 2020-11-18 NOTE — Anesthesia Preprocedure Evaluation (Signed)
Anesthesia Evaluation  Patient identified by MRN, date of birth, ID band Patient awake    Reviewed: Allergy & Precautions, H&P , NPO status , Patient's Chart, lab work & pertinent test results  Airway Mallampati: II  TM Distance: >3 FB Neck ROM: full    Dental no notable dental hx.    Pulmonary former smoker,    Pulmonary exam normal breath sounds clear to auscultation       Cardiovascular hypertension, + angina + CAD and + Past MI  Normal cardiovascular exam Rhythm:regular Rate:Normal     Neuro/Psych PSYCHIATRIC DISORDERS Chronic Pain    GI/Hepatic   Endo/Other    Renal/GU      Musculoskeletal   Abdominal   Peds  Hematology   Anesthesia Other Findings   Reproductive/Obstetrics                             Anesthesia Physical Anesthesia Plan  ASA: III  Anesthesia Plan: General   Post-op Pain Management:    Induction: Intravenous  PONV Risk Score and Plan: 2 and Treatment may vary due to age or medical condition, Propofol infusion and TIVA  Airway Management Planned: Natural Airway  Additional Equipment:   Intra-op Plan:   Post-operative Plan:   Informed Consent: I have reviewed the patients History and Physical, chart, labs and discussed the procedure including the risks, benefits and alternatives for the proposed anesthesia with the patient or authorized representative who has indicated his/her understanding and acceptance.     Dental Advisory Given  Plan Discussed with: CRNA  Anesthesia Plan Comments:         Anesthesia Quick Evaluation

## 2020-11-18 NOTE — Transfer of Care (Signed)
Immediate Anesthesia Transfer of Care Note  Patient: Joel Howe  Procedure(s) Performed: COLONOSCOPY WITH BIOPSY (N/A Rectum) POLYPECTOMY (N/A Rectum)  Patient Location: PACU  Anesthesia Type: General  Level of Consciousness: awake, alert  and patient cooperative  Airway and Oxygen Therapy: Patient Spontanous Breathing   Post-op Assessment: Post-op Vital signs reviewed, Patient's Cardiovascular Status Stable, Respiratory Function Stable, Patent Airway and No signs of Nausea or vomiting  Post-op Vital Signs: Reviewed and stable  Complications: No complications documented.

## 2020-11-18 NOTE — Op Note (Signed)
[facility] Gastroenterology Patient Name: Joel Howe Procedure Date: 11/18/2020 9:02 AM MRN: [id number] Account #: [id number] Date of Birth: [birth year] Admit Type: Outpatient Age: 65 Room: [facility] OR ROOM 01 Gender: Male Note Status: Finalized Procedure:             Colonoscopy Indications:           Follow-up of acute ischemic colitis Providers:             [name] MD, MD Referring MD:          Mertie Clause. Fletcher Anon, MD (Referring MD) Medicines:             Propofol per Anesthesia Complications:         No immediate complications. Procedure:             Pre-Anesthesia Assessment:                        - Prior to the procedure, a History and Physical was                         performed, and patient medications and allergies were                         reviewed. The patient's tolerance of previous                         anesthesia was also reviewed. The risks and benefits                         of the procedure and the sedation options and risks                         were discussed with the patient. All questions were                         answered, and informed consent was obtained. Prior                         Anticoagulants: The patient has taken no previous                         anticoagulant or antiplatelet agents. ASA Grade                         Assessment: II - A patient with mild systemic disease.                         After reviewing the risks and benefits, the patient                         was deemed in satisfactory condition to undergo the                         procedure.                        After obtaining informed consent, the colonoscope was  passed under direct vision. Throughout the procedure,                         the patient's blood pressure, pulse, and oxygen                         saturations were monitored continuously. The                         Colonoscope was introduced through the anus and                          advanced to the the cecum, identified by appendiceal                         orifice and ileocecal valve. The colonoscopy was                         performed without difficulty. The patient tolerated                         the procedure well. The quality of the bowel                         preparation was excellent. Findings:      The perianal and digital rectal examinations were normal.      A 4 mm polyp was found in the ascending colon. The polyp was sessile.       The polyp was removed with a cold snare. Resection and retrieval were       complete.      A 2 mm polyp was found in the sigmoid colon. The polyp was sessile. The       polyp was removed with a cold snare. Resection and retrieval were       complete. Impression:            - One 4 mm polyp in the ascending colon, removed with                         a cold snare. Resected and retrieved.                        - One 2 mm polyp in the sigmoid colon, removed with a                         cold snare. Resected and retrieved. Recommendation:        - Discharge patient to home.                        - Resume previous diet.                        - Continue present medications.                        - Await pathology results.                        - Repeat colonoscopy in 7 years if polyp adenoma and  10 years if hyperplastic Procedure Code(s):     --- Professional ---                        [phone], Colonoscopy, flexible; with removal of                         tumor(s), polyp(s), or other lesion(s) by snare                         technique Diagnosis Code(s):     --- Professional ---                        K55.039, Acute (reversible) ischemia of large                         intestine, extent unspecified                        K63.5, Polyp of colon CPT copyright 2019 American Medical Association. All rights reserved. The codes documented in this report are preliminary and upon  coder review may  be revised to meet current compliance requirements. [name] MD, MD 11/18/2020 9:34:38 AM This report has been signed electronically. Number of Addenda: 0 Note Initiated On: 11/18/2020 9:02 AM Scope Withdrawal Time: 0 hours 9 minutes 25 seconds  Total Procedure Duration: 0 hours 19 minutes 11 seconds  Estimated Blood Loss:  Estimated blood loss: none.      [facility]

## 2020-11-18 NOTE — Anesthesia Procedure Notes (Signed)
Procedure Name: MAC Date/Time: 11/18/2020 9:12 AM Performed by: Vanetta Shawl, CRNA Pre-anesthesia Checklist: Patient identified, Emergency Drugs available, Suction available, Timeout performed and Patient being monitored Patient Re-evaluated:Patient Re-evaluated prior to induction Oxygen Delivery Method: Nasal cannula Placement Confirmation: positive ETCO2

## 2020-11-18 NOTE — Anesthesia Postprocedure Evaluation (Signed)
Anesthesia Post Note  Patient: Joel Howe  Procedure(s) Performed: COLONOSCOPY WITH BIOPSY (N/A Rectum) POLYPECTOMY (N/A Rectum)     Patient location during evaluation: PACU Anesthesia Type: General Level of consciousness: awake and alert and oriented Pain management: satisfactory to patient Vital Signs Assessment: post-procedure vital signs reviewed and stable Respiratory status: spontaneous breathing, nonlabored ventilation and respiratory function stable Cardiovascular status: blood pressure returned to baseline and stable Postop Assessment: Adequate PO intake and No signs of nausea or vomiting Anesthetic complications: no   No complications documented.  Raliegh Ip

## 2020-11-18 NOTE — H&P (Signed)
[name], MD [address] [place], [place] [address] [phone] Fax : [phone]  Primary Care Physician:  Jodi Marble, MD Primary Gastroenterologist:  Dr. Allen Norris  Pre-Procedure History & Physical: HPI:  Joel Howe is a 65 y.o. male is here for an colonoscopy.   Past Medical History:  Diagnosis Date  . Anginal pain ([place])   . Anxiety   . Arthritis   . Chronic back pain   . Coronary artery disease    CABG in 2007 at [facility] with LIMA to LAD, SVG to OM and SVG to right PDA  . Hyperlipidemia   . Hypertension   . Lyme disease   . Multilevel degenerative disc disease   . Myocardial infarction ([place]) 12/2005  . Tobacco use     Past Surgical History:  Procedure Laterality Date  . CARDIAC CATHETERIZATION N/A 05/07/2015   Procedure: Left Heart Cath;  Surgeon: Dionisio David, MD;  Location: [place] [facility];  Service: Cardiovascular;  Laterality: N/A;  . CORONARY ANGIOPLASTY    . CORONARY ARTERY BYPASS GRAFT  02/2006   4 vessel.  [facility]    Prior to Admission medications   Medication Sig Start Date End Date Taking? Authorizing Provider  ALPRAZolam Duanne Moron) 1 MG tablet Take 1 mg by mouth 3 (three) times daily.   Yes [provider]  amLODipine (NORVASC) 5 MG tablet Take 1 tablet (5 mg total) by mouth daily. 07/09/20 10/07/20 Yes Wellington Hampshire, MD  Ascorbic Acid (VITAMIN C) 1000 MG tablet Take 1,000 mg by mouth daily.   Yes [provider]  carvedilol (COREG) 25 MG tablet Take 1 tablet (25 mg total) by mouth 2 (two) times daily with a meal. 07/30/20  Yes Wellington Hampshire, MD  Cholecalciferol (VITAMIN D) 50 MCG (2000 UT) CAPS Take by mouth daily.   Yes [provider]  cyclobenzaprine (FLEXERIL) 10 MG tablet Take 10 mg by mouth 3 (three) times daily as needed for muscle spasms.   Yes [provider]  escitalopram (LEXAPRO) 20 MG tablet Take 20 mg by mouth daily.   Yes [provider]  esomeprazole  (NEXIUM) 20 MG capsule Take 20 mg by mouth daily at 12 noon.   Yes [provider]  levocetirizine (XYZAL) 5 MG tablet Take 5 mg by mouth as needed for allergies.   Yes [provider]  linaCLOtide (LINZESS PO) Take by mouth.   Yes [provider]  lisinopril (PRINIVIL,ZESTRIL) 40 MG tablet Take 40 mg by mouth daily.   Yes [provider]  Melatonin 10 MG TABS Take by mouth at bedtime.   Yes [provider]  morphine (MS CONTIN) 60 MG 12 hr tablet 2 (two) times daily. 11/01/18  Yes [provider]  Multiple Vitamin (MULTIVITAMIN) capsule Take 1 capsule by mouth daily.   Yes [provider]  oxyCODONE-acetaminophen (PERCOCET) 10-325 MG per tablet Take 1 tablet by mouth every 6 (six) hours as needed for pain.   Yes [provider]  polyethylene glycol (MIRALAX / GLYCOLAX) 17 g packet Take 17 g by mouth daily as needed.   Yes [provider]  rosuvastatin (CRESTOR) 40 MG tablet Take 40 mg by mouth daily.   Yes [provider]  clopidogrel (PLAVIX) 75 MG tablet Take 75 mg by mouth daily.    [provider]  senna (SENOKOT) 8.6 MG tablet Take 2 tablets by mouth 2 (two) times daily. Patient not taking: Reported on 11/07/2020  [provider]  SENNA-PLUS 8.6-50 MG tablet Take by mouth. Patient not taking: Reported on 11/07/2020 08/23/20   [provider]    Allergies as of 11/07/2020 - Review Complete 11/07/2020  Allergen Reaction Noted  . Morphine and related Other (See Comments) 05/07/2015  . Propranolol Hives 11/25/2018    Family History  Problem Relation Age of Onset  . Heart attack Mother   . Heart attack Father   . Heart attack Sister   . Heart attack Brother   . Heart attack Brother     Social History   Socioeconomic History  . Marital status: Married    Spouse name: Not on file  . Number of children: Not on file  . Years of education: Not on file  . Highest  education level: Not on file  Occupational History  . Not on file  Tobacco Use  . Smoking status: Former Smoker    Packs/day: 1.00    Years: 45.00    Pack years: 45.00    Types: Cigarettes    Quit date: 07/09/2020    Years since quitting: 0.3  . Smokeless tobacco: Never Used  Vaping Use  . Vaping Use: Every day  . Start date: 07/12/2020  . Substances: Nicotine-salt  Substance and Sexual Activity  . Alcohol use: No  . Drug use: No  . Sexual activity: Not on file  Other Topics Concern  . Not on file  Social History Narrative  . Not on file   Social Determinants of Health   Financial Resource Strain: Not on file  Food Insecurity: Not on file  Transportation Needs: Not on file  Physical Activity: Not on file  Stress: Not on file  Social Connections: Not on file  Intimate Partner Violence: Not on file    Review of Systems: See HPI, otherwise negative ROS  Physical Exam: BP (!) 164/95   Pulse 75   Temp 97.8 F (36.6 C) (Temporal)   Resp 16   Ht 6\' 1"  (1.854 m)   Wt 96.6 kg   SpO2 97%   BMI 28.10 kg/m  General:   Alert,  pleasant and cooperative in NAD Head:  Normocephalic and atraumatic. Neck:  Supple; no masses or thyromegaly. Lungs:  Clear throughout to auscultation.    Heart:  Regular rate and rhythm. Abdomen:  Soft, nontender and nondistended. Normal bowel sounds, without guarding, and without rebound.   Neurologic:  Alert and  oriented x4;  grossly normal neurologically.  Impression/Plan: Joel Howe is here for an colonoscopy to be performed for ischemic colitis   Risks, benefits, limitations, and alternatives regarding  colonoscopy have been reviewed with the patient.  Questions have been answered.  All parties agreeable.   [name], MD  11/18/2020, 9:03 AM

## 2020-11-19 ENCOUNTER — Encounter: Payer: Self-pay | Admitting: Gastroenterology

## 2020-11-20 LAB — SURGICAL PATHOLOGY

## 2020-11-21 ENCOUNTER — Encounter: Payer: Self-pay | Admitting: Gastroenterology

## 2020-11-26 ENCOUNTER — Other Ambulatory Visit: Payer: Self-pay

## 2020-11-26 MED ORDER — LINACLOTIDE 290 MCG PO CAPS: 290.0000 ug | ORAL_CAPSULE | Freq: Every day | ORAL | 6 refills | Status: DC

## 2020-12-05 DIAGNOSIS — I2510 Atherosclerotic heart disease of native coronary artery without angina pectoris: Secondary | ICD-10-CM | POA: Diagnosis not present

## 2020-12-05 DIAGNOSIS — I10 Essential (primary) hypertension: Secondary | ICD-10-CM | POA: Diagnosis not present

## 2020-12-06 DIAGNOSIS — M129 Arthropathy, unspecified: Secondary | ICD-10-CM | POA: Diagnosis not present

## 2020-12-06 DIAGNOSIS — J209 Acute bronchitis, unspecified: Secondary | ICD-10-CM | POA: Diagnosis not present

## 2020-12-06 DIAGNOSIS — G2581 Restless legs syndrome: Secondary | ICD-10-CM | POA: Diagnosis not present

## 2020-12-06 DIAGNOSIS — R69 Illness, unspecified: Secondary | ICD-10-CM | POA: Diagnosis not present

## 2020-12-06 DIAGNOSIS — R7303 Prediabetes: Secondary | ICD-10-CM | POA: Diagnosis not present

## 2020-12-06 DIAGNOSIS — I10 Essential (primary) hypertension: Secondary | ICD-10-CM | POA: Diagnosis not present

## 2020-12-06 DIAGNOSIS — E785 Hyperlipidemia, unspecified: Secondary | ICD-10-CM | POA: Diagnosis not present

## 2020-12-06 DIAGNOSIS — I2510 Atherosclerotic heart disease of native coronary artery without angina pectoris: Secondary | ICD-10-CM | POA: Diagnosis not present

## 2021-01-01 DIAGNOSIS — M47812 Spondylosis without myelopathy or radiculopathy, cervical region: Secondary | ICD-10-CM | POA: Diagnosis not present

## 2021-01-01 DIAGNOSIS — M150 Primary generalized (osteo)arthritis: Secondary | ICD-10-CM | POA: Diagnosis not present

## 2021-01-01 DIAGNOSIS — G894 Chronic pain syndrome: Secondary | ICD-10-CM | POA: Diagnosis not present

## 2021-01-01 DIAGNOSIS — M47816 Spondylosis without myelopathy or radiculopathy, lumbar region: Secondary | ICD-10-CM | POA: Diagnosis not present

## 2021-02-13 ENCOUNTER — Ambulatory Visit: Payer: Medicare HMO | Admitting: Cardiovascular Disease

## 2021-02-26 DIAGNOSIS — M47816 Spondylosis without myelopathy or radiculopathy, lumbar region: Secondary | ICD-10-CM | POA: Diagnosis not present

## 2021-02-26 DIAGNOSIS — Z79891 Long term (current) use of opiate analgesic: Secondary | ICD-10-CM | POA: Diagnosis not present

## 2021-02-26 DIAGNOSIS — M150 Primary generalized (osteo)arthritis: Secondary | ICD-10-CM | POA: Diagnosis not present

## 2021-02-26 DIAGNOSIS — M47812 Spondylosis without myelopathy or radiculopathy, cervical region: Secondary | ICD-10-CM | POA: Diagnosis not present

## 2021-02-26 DIAGNOSIS — G894 Chronic pain syndrome: Secondary | ICD-10-CM | POA: Diagnosis not present

## 2021-03-14 DIAGNOSIS — G2581 Restless legs syndrome: Secondary | ICD-10-CM | POA: Diagnosis not present

## 2021-03-14 DIAGNOSIS — G8929 Other chronic pain: Secondary | ICD-10-CM | POA: Diagnosis not present

## 2021-03-14 DIAGNOSIS — R69 Illness, unspecified: Secondary | ICD-10-CM | POA: Diagnosis not present

## 2021-03-14 DIAGNOSIS — I10 Essential (primary) hypertension: Secondary | ICD-10-CM | POA: Diagnosis not present

## 2021-03-14 DIAGNOSIS — M129 Arthropathy, unspecified: Secondary | ICD-10-CM | POA: Diagnosis not present

## 2021-03-14 DIAGNOSIS — I2510 Atherosclerotic heart disease of native coronary artery without angina pectoris: Secondary | ICD-10-CM | POA: Diagnosis not present

## 2021-03-14 DIAGNOSIS — E785 Hyperlipidemia, unspecified: Secondary | ICD-10-CM | POA: Diagnosis not present

## 2021-03-14 DIAGNOSIS — R7303 Prediabetes: Secondary | ICD-10-CM | POA: Diagnosis not present

## 2021-03-14 DIAGNOSIS — R609 Edema, unspecified: Secondary | ICD-10-CM | POA: Diagnosis not present

## 2021-03-19 DIAGNOSIS — R609 Edema, unspecified: Secondary | ICD-10-CM | POA: Diagnosis not present

## 2021-03-24 DIAGNOSIS — R7303 Prediabetes: Secondary | ICD-10-CM | POA: Diagnosis not present

## 2021-03-24 DIAGNOSIS — E785 Hyperlipidemia, unspecified: Secondary | ICD-10-CM | POA: Diagnosis not present

## 2021-03-25 DIAGNOSIS — G2581 Restless legs syndrome: Secondary | ICD-10-CM | POA: Diagnosis not present

## 2021-03-25 DIAGNOSIS — R7303 Prediabetes: Secondary | ICD-10-CM | POA: Diagnosis not present

## 2021-03-25 DIAGNOSIS — R69 Illness, unspecified: Secondary | ICD-10-CM | POA: Diagnosis not present

## 2021-03-25 DIAGNOSIS — E785 Hyperlipidemia, unspecified: Secondary | ICD-10-CM | POA: Diagnosis not present

## 2021-03-25 DIAGNOSIS — R609 Edema, unspecified: Secondary | ICD-10-CM | POA: Diagnosis not present

## 2021-03-25 DIAGNOSIS — N529 Male erectile dysfunction, unspecified: Secondary | ICD-10-CM | POA: Insufficient documentation

## 2021-03-25 DIAGNOSIS — I872 Venous insufficiency (chronic) (peripheral): Secondary | ICD-10-CM | POA: Diagnosis not present

## 2021-03-25 DIAGNOSIS — I2510 Atherosclerotic heart disease of native coronary artery without angina pectoris: Secondary | ICD-10-CM | POA: Diagnosis not present

## 2021-03-25 DIAGNOSIS — M129 Arthropathy, unspecified: Secondary | ICD-10-CM | POA: Diagnosis not present

## 2021-03-25 DIAGNOSIS — I10 Essential (primary) hypertension: Secondary | ICD-10-CM | POA: Diagnosis not present

## 2021-04-22 DIAGNOSIS — Z79891 Long term (current) use of opiate analgesic: Secondary | ICD-10-CM | POA: Diagnosis not present

## 2021-04-22 DIAGNOSIS — M47816 Spondylosis without myelopathy or radiculopathy, lumbar region: Secondary | ICD-10-CM | POA: Diagnosis not present

## 2021-04-22 DIAGNOSIS — G894 Chronic pain syndrome: Secondary | ICD-10-CM | POA: Diagnosis not present

## 2021-04-22 DIAGNOSIS — M150 Primary generalized (osteo)arthritis: Secondary | ICD-10-CM | POA: Diagnosis not present

## 2021-06-02 ENCOUNTER — Other Ambulatory Visit: Payer: Self-pay | Admitting: Cardiovascular Disease

## 2021-06-03 DIAGNOSIS — H524 Presbyopia: Secondary | ICD-10-CM | POA: Diagnosis not present

## 2021-06-03 DIAGNOSIS — M47816 Spondylosis without myelopathy or radiculopathy, lumbar region: Secondary | ICD-10-CM | POA: Diagnosis not present

## 2021-06-17 DIAGNOSIS — M47816 Spondylosis without myelopathy or radiculopathy, lumbar region: Secondary | ICD-10-CM | POA: Diagnosis not present

## 2021-06-18 DIAGNOSIS — G894 Chronic pain syndrome: Secondary | ICD-10-CM | POA: Diagnosis not present

## 2021-06-18 DIAGNOSIS — M47816 Spondylosis without myelopathy or radiculopathy, lumbar region: Secondary | ICD-10-CM | POA: Diagnosis not present

## 2021-06-18 DIAGNOSIS — Z79891 Long term (current) use of opiate analgesic: Secondary | ICD-10-CM | POA: Diagnosis not present

## 2021-06-18 DIAGNOSIS — M150 Primary generalized (osteo)arthritis: Secondary | ICD-10-CM | POA: Diagnosis not present

## 2021-06-25 DIAGNOSIS — I10 Essential (primary) hypertension: Secondary | ICD-10-CM | POA: Diagnosis not present

## 2021-06-25 DIAGNOSIS — E785 Hyperlipidemia, unspecified: Secondary | ICD-10-CM | POA: Diagnosis not present

## 2021-06-25 DIAGNOSIS — G2581 Restless legs syndrome: Secondary | ICD-10-CM | POA: Diagnosis not present

## 2021-06-25 DIAGNOSIS — M7550 Bursitis of unspecified shoulder: Secondary | ICD-10-CM | POA: Diagnosis not present

## 2021-06-25 DIAGNOSIS — I2510 Atherosclerotic heart disease of native coronary artery without angina pectoris: Secondary | ICD-10-CM | POA: Diagnosis not present

## 2021-06-25 DIAGNOSIS — R7303 Prediabetes: Secondary | ICD-10-CM | POA: Diagnosis not present

## 2021-06-25 DIAGNOSIS — R609 Edema, unspecified: Secondary | ICD-10-CM | POA: Diagnosis not present

## 2021-06-25 DIAGNOSIS — R69 Illness, unspecified: Secondary | ICD-10-CM | POA: Diagnosis not present

## 2021-06-25 DIAGNOSIS — M129 Arthropathy, unspecified: Secondary | ICD-10-CM | POA: Diagnosis not present

## 2021-06-27 DIAGNOSIS — M47816 Spondylosis without myelopathy or radiculopathy, lumbar region: Secondary | ICD-10-CM | POA: Diagnosis not present

## 2021-08-14 DIAGNOSIS — Z79891 Long term (current) use of opiate analgesic: Secondary | ICD-10-CM | POA: Diagnosis not present

## 2021-08-14 DIAGNOSIS — M47816 Spondylosis without myelopathy or radiculopathy, lumbar region: Secondary | ICD-10-CM | POA: Diagnosis not present

## 2021-08-14 DIAGNOSIS — G894 Chronic pain syndrome: Secondary | ICD-10-CM | POA: Diagnosis not present

## 2021-08-14 DIAGNOSIS — M150 Primary generalized (osteo)arthritis: Secondary | ICD-10-CM | POA: Diagnosis not present

## 2021-09-22 ENCOUNTER — Other Ambulatory Visit: Payer: Self-pay | Admitting: Cardiovascular Disease

## 2021-09-22 NOTE — Telephone Encounter (Signed)
Scheduled

## 2021-09-22 NOTE — Telephone Encounter (Signed)
Patient needs an appt for further refills, Thanks !

## 2021-09-24 DIAGNOSIS — R5383 Other fatigue: Secondary | ICD-10-CM | POA: Diagnosis not present

## 2021-09-24 DIAGNOSIS — Z20822 Contact with and (suspected) exposure to covid-19: Secondary | ICD-10-CM | POA: Diagnosis not present

## 2021-09-24 DIAGNOSIS — E785 Hyperlipidemia, unspecified: Secondary | ICD-10-CM | POA: Diagnosis not present

## 2021-09-26 DIAGNOSIS — M129 Arthropathy, unspecified: Secondary | ICD-10-CM | POA: Diagnosis not present

## 2021-09-26 DIAGNOSIS — I10 Essential (primary) hypertension: Secondary | ICD-10-CM | POA: Diagnosis not present

## 2021-09-26 DIAGNOSIS — F339 Major depressive disorder, recurrent, unspecified: Secondary | ICD-10-CM | POA: Diagnosis not present

## 2021-09-26 DIAGNOSIS — J301 Allergic rhinitis due to pollen: Secondary | ICD-10-CM | POA: Diagnosis not present

## 2021-09-26 DIAGNOSIS — R69 Illness, unspecified: Secondary | ICD-10-CM | POA: Diagnosis not present

## 2021-09-26 DIAGNOSIS — I2510 Atherosclerotic heart disease of native coronary artery without angina pectoris: Secondary | ICD-10-CM | POA: Diagnosis not present

## 2021-09-26 DIAGNOSIS — R609 Edema, unspecified: Secondary | ICD-10-CM | POA: Insufficient documentation

## 2021-09-26 DIAGNOSIS — J208 Acute bronchitis due to other specified organisms: Secondary | ICD-10-CM | POA: Diagnosis not present

## 2021-09-26 DIAGNOSIS — Z20822 Contact with and (suspected) exposure to covid-19: Secondary | ICD-10-CM | POA: Diagnosis not present

## 2021-09-26 DIAGNOSIS — M7550 Bursitis of unspecified shoulder: Secondary | ICD-10-CM | POA: Diagnosis not present

## 2021-09-26 DIAGNOSIS — R7303 Prediabetes: Secondary | ICD-10-CM | POA: Diagnosis not present

## 2021-09-26 DIAGNOSIS — G2581 Restless legs syndrome: Secondary | ICD-10-CM | POA: Diagnosis not present

## 2021-09-26 DIAGNOSIS — N401 Enlarged prostate with lower urinary tract symptoms: Secondary | ICD-10-CM | POA: Diagnosis not present

## 2021-09-26 DIAGNOSIS — N529 Male erectile dysfunction, unspecified: Secondary | ICD-10-CM | POA: Diagnosis not present

## 2021-10-07 ENCOUNTER — Telehealth: Payer: Self-pay

## 2021-10-07 NOTE — Telephone Encounter (Signed)
error 

## 2021-10-07 NOTE — Telephone Encounter (Signed)
CALLED PATIENT NO ANSWER LEFT VOICEMAIL FOR A CALL BACK ? ?

## 2021-10-09 DIAGNOSIS — Z79891 Long term (current) use of opiate analgesic: Secondary | ICD-10-CM | POA: Diagnosis not present

## 2021-10-09 DIAGNOSIS — M47816 Spondylosis without myelopathy or radiculopathy, lumbar region: Secondary | ICD-10-CM | POA: Diagnosis not present

## 2021-10-09 DIAGNOSIS — M150 Primary generalized (osteo)arthritis: Secondary | ICD-10-CM | POA: Diagnosis not present

## 2021-10-09 DIAGNOSIS — G894 Chronic pain syndrome: Secondary | ICD-10-CM | POA: Diagnosis not present

## 2021-10-14 ENCOUNTER — Telehealth: Payer: Self-pay

## 2021-10-14 NOTE — Telephone Encounter (Signed)
Called patient to schedule him his colonoscopy. Waiting for him to call us hack so we could schedule his colonoscopy.

## 2021-10-23 ENCOUNTER — Other Ambulatory Visit: Payer: Self-pay | Admitting: Cardiovascular Disease

## 2021-10-23 NOTE — Telephone Encounter (Signed)
Dr. Allen Norris, does this patient need a colonoscopy? Please advise.  You had stated the following on his colonoscopy (11/18/2020) - Await pathology results. - Repeat colonoscopy in 7 years if polyp adenoma and 10 years if hyperplastic

## 2021-10-27 NOTE — Telephone Encounter (Signed)
Called patient to let him know that he is to have his repeat colonoscopy 7 years from his last one on 11/2020. Patient understood and had no further questions. Patient was also informed that he would be put in our recall list.

## 2021-11-06 ENCOUNTER — Ambulatory Visit: Payer: Medicare HMO | Admitting: Cardiovascular Disease

## 2021-11-06 ENCOUNTER — Encounter: Payer: Self-pay | Admitting: Cardiovascular Disease

## 2021-11-06 ENCOUNTER — Other Ambulatory Visit: Payer: Self-pay

## 2021-11-06 VITALS — BP 120/78 | HR 71 | Ht 73.0 in | Wt 211.2 lb

## 2021-11-06 DIAGNOSIS — Z720 Tobacco use: Secondary | ICD-10-CM | POA: Diagnosis not present

## 2021-11-06 DIAGNOSIS — I2510 Atherosclerotic heart disease of native coronary artery without angina pectoris: Secondary | ICD-10-CM | POA: Diagnosis not present

## 2021-11-06 DIAGNOSIS — E785 Hyperlipidemia, unspecified: Secondary | ICD-10-CM

## 2021-11-06 DIAGNOSIS — I10 Essential (primary) hypertension: Secondary | ICD-10-CM | POA: Diagnosis not present

## 2021-11-06 NOTE — Progress Notes (Signed)
Cardiology Office Note   Date:  11/06/2021   ID:  Joel Howe, DOB Feb 11, 1956, MRN [id number]  PCP:  Jodi Marble, MD  Cardiologist:   Kathlyn Sacramento, MD   Chief Complaint  Patient presents with   Other    OD 4 month f/u no complaints today. Meds reviewed verbally with pt.      History of Present Illness: Joel Howe is a 66 y.o. male who presents for a follow-up visit regarding coronary artery disease.    He has known history of coronary artery disease status post CABG in 2007 at [facility], hypertension, tobacco use, chronic pain on narcotics and hyperlipidemia. Most recent cardiac catheterization in 2016 showed significant three-vessel coronary artery disease with patent grafts including LIMA to LAD, SVG to OM and SVG to right PDA.  There was 50 to 60% stenosis in ostial SVG to right PDA. He was hospitalized in 2021 at [facility] with blood in stool and was suspected of having ischemic colitis.  He was hypotensive on presentation but responded to IV fluids.  They reported excessive alcohol use.  The patient reports drinking some vodka before that after the death of his mom.   He quit smoking cigarettes after that hospitalization but he continues to vape.  In addition, he stopped drinking alcohol. He has been doing very well with no recent chest pain, shortness of breath or palpitations.  He does have chronic back pain but that has improved with nerve stimulation.  He wants to start exercising.   Past Medical History:  Diagnosis Date   Anginal pain ([place])    Anxiety    Arthritis    Chronic back pain    Coronary artery disease    CABG in 2007 at [facility] with LIMA to LAD, SVG to OM and SVG to right PDA   Hyperlipidemia    Hypertension    Lyme disease    Multilevel degenerative disc disease    Myocardial infarction ([place]) 12/2005   Tobacco use     Past Surgical History:  Procedure Laterality Date   CARDIAC CATHETERIZATION N/A 05/07/2015   Procedure: Left Heart Cath;  Surgeon:  Dionisio David, MD;  Location: [place] [facility];  Service: Cardiovascular;  Laterality: N/A;   COLONOSCOPY WITH PROPOFOL N/A 11/18/2020   Procedure: COLONOSCOPY WITH BIOPSY;  Surgeon: [name], MD;  Location: [place];  Service: Endoscopy;  Laterality: N/A;   CORONARY ANGIOPLASTY     CORONARY ARTERY BYPASS GRAFT  02/2006   4 vessel.  [facility]   POLYPECTOMY N/A 11/18/2020   Procedure: POLYPECTOMY;  Surgeon: [name], MD;  Location: [place];  Service: Endoscopy;  Laterality: N/A;     Current Outpatient Medications  Medication Sig Dispense Refill   Albuterol Sulfate, sensor, (PROAIR DIGIHALER) 108 (90 Base) MCG/ACT AEPB as needed.     ALPRAZolam (XANAX) 1 MG tablet Take 1 mg by mouth 3 (three) times daily.     amLODipine (NORVASC) 5 MG tablet Take 1 tablet (5 mg total) by mouth daily. 30 tablet 0   Ascorbic Acid (VITAMIN C) 1000 MG tablet Take 1,000 mg by mouth daily.     Budeson-Glycopyrrol-Formoterol (BREZTRI AEROSPHERE) 160-9-4.8 MCG/ACT AERO as needed.     carvedilol (COREG) 25 MG tablet Take 1 tablet (25 mg total) by mouth 2 (two) times daily with a meal. MUST BE SEEN IN CLINIC FOR FURTHER REFILLS PLEASE CALL OFFICE TO SCHEDULE AN APPOINTMENT. 60 tablet 1   Cholecalciferol (VITAMIN D)  50 MCG (2000 UT) CAPS Take by mouth daily.     clopidogrel (PLAVIX) 75 MG tablet Take 75 mg by mouth daily.     cyclobenzaprine (FLEXERIL) 10 MG tablet Take 10 mg by mouth 3 (three) times daily as needed for muscle spasms.     escitalopram (LEXAPRO) 20 MG tablet Take 20 mg by mouth daily.     esomeprazole (NEXIUM) 20 MG capsule Take 20 mg by mouth daily at 12 noon.     fluticasone (FLONASE) 50 MCG/ACT nasal spray as needed.     furosemide (LASIX) 20 MG tablet daily.     hydrochlorothiazide (HYDRODIURIL) 25 MG tablet daily.     levocetirizine (XYZAL) 5 MG tablet Take 5 mg by mouth as needed for allergies.     linaclotide (LINZESS) 290 MCG CAPS capsule Take 1 capsule (290 mcg  total) by mouth daily before breakfast. 30 capsule 6   lisinopril (PRINIVIL,ZESTRIL) 40 MG tablet Take 40 mg by mouth daily.     Melatonin 10 MG TABS Take by mouth at bedtime.     morphine (MS CONTIN) 60 MG 12 hr tablet 2 (two) times daily.     Multiple Vitamin (MULTIVITAMIN) capsule Take 1 capsule by mouth daily.     Multiple Vitamins-Minerals (CENTRUM SILVER 50+MEN) TABS daily.     oxyCODONE-acetaminophen (PERCOCET) 10-325 MG per tablet Take 1 tablet by mouth every 6 (six) hours as needed for pain.     polyethylene glycol (MIRALAX / GLYCOLAX) 17 g packet Take 17 g by mouth daily as needed.     rOPINIRole (REQUIP) 0.25 MG tablet daily.     rosuvastatin (CRESTOR) 40 MG tablet Take 40 mg by mouth daily.     senna (SENOKOT) 8.6 MG tablet Take 2 tablets by mouth 2 (two) times daily.     tamsulosin (FLOMAX) 0.4 MG CAPS capsule daily.     No current facility-administered medications for this visit.    Allergies:   Morphine and related and Propranolol    Social History:  The patient  reports that he quit smoking about 15 months ago. His smoking use included cigarettes. He has a 45.00 pack-year smoking history. He has never used smokeless tobacco. He reports that he does not drink alcohol and does not use drugs.   Family History:  The patient's family history includes Heart attack in his brother, brother, father, mother, and sister.    ROS:  Please see the history of present illness.   Otherwise, review of systems are positive for none.   All other systems are reviewed and negative.    PHYSICAL EXAM: VS:  BP 120/78 (BP Location: Left Arm, Patient Position: Sitting, Cuff Size: Normal)    Pulse 71    Ht 6\' 1"  (1.854 m)    Wt 211 lb 4 oz (95.8 kg)    SpO2 98%    BMI 27.87 kg/m  , BMI Body mass index is 27.87 kg/m. GEN: Well nourished, well developed, in no acute distress  HEENT: normal  Neck: no JVD, carotid bruits, or masses Cardiac: RRR; no murmurs, rubs, or gallops,no edema  Respiratory:   clear to auscultation bilaterally, normal work of breathing GI: soft, nontender, nondistended, + BS MS: no deformity or atrophy  Skin: warm and dry, no rash Neuro:  Strength and sensation are intact Psych: euthymic mood, full affect   EKG:  EKG is ordered today. The ekg ordered today demonstrates normal sinus rhythm with nonspecific T wave changes.   Recent Labs: No results  found for requested labs within last 8760 hours.    Lipid Panel No results found for: CHOL, TRIG, HDL, CHOLHDL, VLDL, LDLCALC, LDLDIRECT    Wt Readings from Last 3 Encounters:  11/06/21 211 lb 4 oz (95.8 kg)  11/18/20 213 lb (96.6 kg)  11/05/20 221 lb 12.8 oz (100.6 kg)       PAD Screen 11/25/2018  Previous PAD dx? No  Previous surgical procedure? No  Pain with walking? Yes  Subsides with rest? No  Feet/toe relief with dangling? No  Painful, non-healing ulcers? No  Extremities discolored? No      ASSESSMENT AND PLAN:  1.  Coronary artery disease involving native coronary arteries without angina: He is doing well overall.  I recommend continuing medical therapy.  He prefers to stay on clopidogrel without aspirin.  I think this is reasonable.  2.  Essential hypertension: His blood pressure is well controlled on current medications including carvedilol, lisinopril and amlodipine.  3.  Hyperlipidemia: Continue high-dose rosuvastatin with a target LDL of less than 70.    4.  Tobacco use: He quit smoking but does vape.  I discussed with him the importance of abstinence.  5.  Previous alcohol use.  He reports that he quit drinking last year.   Disposition:   FU with me in 12 months  Signed,  Kathlyn Sacramento, MD  11/06/2021 8:54 AM    [place]

## 2021-11-06 NOTE — Patient Instructions (Signed)

## 2021-11-19 ENCOUNTER — Other Ambulatory Visit: Payer: Self-pay | Admitting: Cardiovascular Disease

## 2021-12-04 DIAGNOSIS — M150 Primary generalized (osteo)arthritis: Secondary | ICD-10-CM | POA: Diagnosis not present

## 2021-12-04 DIAGNOSIS — M47816 Spondylosis without myelopathy or radiculopathy, lumbar region: Secondary | ICD-10-CM | POA: Diagnosis not present

## 2021-12-04 DIAGNOSIS — Z79891 Long term (current) use of opiate analgesic: Secondary | ICD-10-CM | POA: Diagnosis not present

## 2021-12-04 DIAGNOSIS — G894 Chronic pain syndrome: Secondary | ICD-10-CM | POA: Diagnosis not present

## 2021-12-16 DIAGNOSIS — R079 Chest pain, unspecified: Secondary | ICD-10-CM | POA: Diagnosis not present

## 2021-12-16 DIAGNOSIS — R0789 Other chest pain: Secondary | ICD-10-CM | POA: Diagnosis not present

## 2021-12-16 DIAGNOSIS — Z5329 Procedure and treatment not carried out because of patient's decision for other reasons: Secondary | ICD-10-CM | POA: Diagnosis not present

## 2021-12-16 DIAGNOSIS — I2510 Atherosclerotic heart disease of native coronary artery without angina pectoris: Secondary | ICD-10-CM | POA: Diagnosis not present

## 2021-12-16 DIAGNOSIS — R9431 Abnormal electrocardiogram [ECG] [EKG]: Secondary | ICD-10-CM | POA: Diagnosis not present

## 2021-12-16 DIAGNOSIS — I10 Essential (primary) hypertension: Secondary | ICD-10-CM | POA: Diagnosis not present

## 2021-12-16 DIAGNOSIS — Z951 Presence of aortocoronary bypass graft: Secondary | ICD-10-CM | POA: Diagnosis not present

## 2021-12-16 DIAGNOSIS — R4781 Slurred speech: Secondary | ICD-10-CM | POA: Diagnosis not present

## 2021-12-29 DIAGNOSIS — I2510 Atherosclerotic heart disease of native coronary artery without angina pectoris: Secondary | ICD-10-CM | POA: Diagnosis not present

## 2021-12-29 DIAGNOSIS — M7550 Bursitis of unspecified shoulder: Secondary | ICD-10-CM | POA: Diagnosis not present

## 2021-12-29 DIAGNOSIS — R7303 Prediabetes: Secondary | ICD-10-CM | POA: Diagnosis not present

## 2021-12-29 DIAGNOSIS — R69 Illness, unspecified: Secondary | ICD-10-CM | POA: Diagnosis not present

## 2021-12-29 DIAGNOSIS — M129 Arthropathy, unspecified: Secondary | ICD-10-CM | POA: Diagnosis not present

## 2021-12-29 DIAGNOSIS — I10 Essential (primary) hypertension: Secondary | ICD-10-CM | POA: Diagnosis not present

## 2021-12-29 DIAGNOSIS — J301 Allergic rhinitis due to pollen: Secondary | ICD-10-CM | POA: Diagnosis not present

## 2021-12-29 DIAGNOSIS — G2581 Restless legs syndrome: Secondary | ICD-10-CM | POA: Diagnosis not present

## 2021-12-29 DIAGNOSIS — G4700 Insomnia, unspecified: Secondary | ICD-10-CM | POA: Diagnosis not present

## 2021-12-29 DIAGNOSIS — J208 Acute bronchitis due to other specified organisms: Secondary | ICD-10-CM | POA: Diagnosis not present

## 2022-01-29 DIAGNOSIS — Z79891 Long term (current) use of opiate analgesic: Secondary | ICD-10-CM | POA: Diagnosis not present

## 2022-01-29 DIAGNOSIS — M150 Primary generalized (osteo)arthritis: Secondary | ICD-10-CM | POA: Diagnosis not present

## 2022-01-29 DIAGNOSIS — M47816 Spondylosis without myelopathy or radiculopathy, lumbar region: Secondary | ICD-10-CM | POA: Diagnosis not present

## 2022-01-29 DIAGNOSIS — G894 Chronic pain syndrome: Secondary | ICD-10-CM | POA: Diagnosis not present

## 2022-03-26 DIAGNOSIS — M47816 Spondylosis without myelopathy or radiculopathy, lumbar region: Secondary | ICD-10-CM | POA: Diagnosis not present

## 2022-03-26 DIAGNOSIS — G894 Chronic pain syndrome: Secondary | ICD-10-CM | POA: Diagnosis not present

## 2022-03-26 DIAGNOSIS — M150 Primary generalized (osteo)arthritis: Secondary | ICD-10-CM | POA: Diagnosis not present

## 2022-03-26 DIAGNOSIS — Z79891 Long term (current) use of opiate analgesic: Secondary | ICD-10-CM | POA: Diagnosis not present

## 2022-04-03 DIAGNOSIS — R7303 Prediabetes: Secondary | ICD-10-CM | POA: Diagnosis not present

## 2022-04-03 DIAGNOSIS — E785 Hyperlipidemia, unspecified: Secondary | ICD-10-CM | POA: Diagnosis not present

## 2022-04-03 DIAGNOSIS — I10 Essential (primary) hypertension: Secondary | ICD-10-CM | POA: Diagnosis not present

## 2022-04-06 DIAGNOSIS — J301 Allergic rhinitis due to pollen: Secondary | ICD-10-CM | POA: Diagnosis not present

## 2022-04-06 DIAGNOSIS — R69 Illness, unspecified: Secondary | ICD-10-CM | POA: Diagnosis not present

## 2022-04-06 DIAGNOSIS — R7303 Prediabetes: Secondary | ICD-10-CM | POA: Diagnosis not present

## 2022-04-06 DIAGNOSIS — J208 Acute bronchitis due to other specified organisms: Secondary | ICD-10-CM | POA: Diagnosis not present

## 2022-04-06 DIAGNOSIS — N401 Enlarged prostate with lower urinary tract symptoms: Secondary | ICD-10-CM | POA: Diagnosis not present

## 2022-04-06 DIAGNOSIS — G2581 Restless legs syndrome: Secondary | ICD-10-CM | POA: Diagnosis not present

## 2022-04-06 DIAGNOSIS — E785 Hyperlipidemia, unspecified: Secondary | ICD-10-CM | POA: Diagnosis not present

## 2022-04-06 DIAGNOSIS — G4700 Insomnia, unspecified: Secondary | ICD-10-CM | POA: Diagnosis not present

## 2022-04-06 DIAGNOSIS — I2510 Atherosclerotic heart disease of native coronary artery without angina pectoris: Secondary | ICD-10-CM | POA: Diagnosis not present

## 2022-04-06 DIAGNOSIS — I10 Essential (primary) hypertension: Secondary | ICD-10-CM | POA: Diagnosis not present

## 2022-04-06 DIAGNOSIS — E871 Hypo-osmolality and hyponatremia: Secondary | ICD-10-CM | POA: Diagnosis not present

## 2022-04-06 DIAGNOSIS — M129 Arthropathy, unspecified: Secondary | ICD-10-CM | POA: Diagnosis not present

## 2022-05-05 DIAGNOSIS — E785 Hyperlipidemia, unspecified: Secondary | ICD-10-CM | POA: Diagnosis not present

## 2022-05-05 DIAGNOSIS — Z118 Encounter for screening for other infectious and parasitic diseases: Secondary | ICD-10-CM | POA: Diagnosis not present

## 2022-05-05 DIAGNOSIS — I2510 Atherosclerotic heart disease of native coronary artery without angina pectoris: Secondary | ICD-10-CM | POA: Diagnosis not present

## 2022-05-05 DIAGNOSIS — M129 Arthropathy, unspecified: Secondary | ICD-10-CM | POA: Diagnosis not present

## 2022-05-05 DIAGNOSIS — T23239A Burn of second degree of unspecified multiple fingers (nail), not including thumb, initial encounter: Secondary | ICD-10-CM | POA: Diagnosis not present

## 2022-05-05 DIAGNOSIS — R634 Abnormal weight loss: Secondary | ICD-10-CM | POA: Diagnosis not present

## 2022-05-05 DIAGNOSIS — I10 Essential (primary) hypertension: Secondary | ICD-10-CM | POA: Diagnosis not present

## 2022-05-05 DIAGNOSIS — E871 Hypo-osmolality and hyponatremia: Secondary | ICD-10-CM | POA: Diagnosis not present

## 2022-05-05 DIAGNOSIS — R7303 Prediabetes: Secondary | ICD-10-CM | POA: Diagnosis not present

## 2022-05-05 DIAGNOSIS — N401 Enlarged prostate with lower urinary tract symptoms: Secondary | ICD-10-CM | POA: Diagnosis not present

## 2022-05-05 DIAGNOSIS — R69 Illness, unspecified: Secondary | ICD-10-CM | POA: Diagnosis not present

## 2022-05-05 DIAGNOSIS — G2581 Restless legs syndrome: Secondary | ICD-10-CM | POA: Diagnosis not present

## 2022-05-05 DIAGNOSIS — J329 Chronic sinusitis, unspecified: Secondary | ICD-10-CM | POA: Diagnosis not present

## 2022-05-07 DIAGNOSIS — R110 Nausea: Secondary | ICD-10-CM | POA: Diagnosis not present

## 2022-05-07 DIAGNOSIS — F17290 Nicotine dependence, other tobacco product, uncomplicated: Secondary | ICD-10-CM | POA: Diagnosis not present

## 2022-05-07 DIAGNOSIS — R638 Other symptoms and signs concerning food and fluid intake: Secondary | ICD-10-CM | POA: Diagnosis not present

## 2022-05-07 DIAGNOSIS — I358 Other nonrheumatic aortic valve disorders: Secondary | ICD-10-CM | POA: Diagnosis not present

## 2022-05-07 DIAGNOSIS — G8929 Other chronic pain: Secondary | ICD-10-CM | POA: Diagnosis not present

## 2022-05-07 DIAGNOSIS — I10 Essential (primary) hypertension: Secondary | ICD-10-CM | POA: Diagnosis not present

## 2022-05-07 DIAGNOSIS — I2510 Atherosclerotic heart disease of native coronary artery without angina pectoris: Secondary | ICD-10-CM | POA: Diagnosis not present

## 2022-05-07 DIAGNOSIS — N3289 Other specified disorders of bladder: Secondary | ICD-10-CM | POA: Diagnosis not present

## 2022-05-07 DIAGNOSIS — R112 Nausea with vomiting, unspecified: Secondary | ICD-10-CM | POA: Diagnosis not present

## 2022-05-07 DIAGNOSIS — Z20822 Contact with and (suspected) exposure to covid-19: Secondary | ICD-10-CM | POA: Diagnosis not present

## 2022-05-07 DIAGNOSIS — Z951 Presence of aortocoronary bypass graft: Secondary | ICD-10-CM | POA: Diagnosis not present

## 2022-05-07 DIAGNOSIS — R918 Other nonspecific abnormal finding of lung field: Secondary | ICD-10-CM | POA: Diagnosis not present

## 2022-05-07 DIAGNOSIS — R531 Weakness: Secondary | ICD-10-CM | POA: Diagnosis not present

## 2022-05-07 DIAGNOSIS — K551 Chronic vascular disorders of intestine: Secondary | ICD-10-CM | POA: Diagnosis not present

## 2022-05-07 DIAGNOSIS — R634 Abnormal weight loss: Secondary | ICD-10-CM | POA: Diagnosis not present

## 2022-05-07 DIAGNOSIS — M545 Low back pain, unspecified: Secondary | ICD-10-CM | POA: Diagnosis not present

## 2022-05-08 DIAGNOSIS — R918 Other nonspecific abnormal finding of lung field: Secondary | ICD-10-CM | POA: Diagnosis not present

## 2022-05-08 DIAGNOSIS — I358 Other nonrheumatic aortic valve disorders: Secondary | ICD-10-CM | POA: Diagnosis not present

## 2022-05-08 DIAGNOSIS — I2510 Atherosclerotic heart disease of native coronary artery without angina pectoris: Secondary | ICD-10-CM | POA: Diagnosis not present

## 2022-05-08 DIAGNOSIS — R634 Abnormal weight loss: Secondary | ICD-10-CM | POA: Diagnosis not present

## 2022-05-21 DIAGNOSIS — M150 Primary generalized (osteo)arthritis: Secondary | ICD-10-CM | POA: Diagnosis not present

## 2022-05-21 DIAGNOSIS — Z79891 Long term (current) use of opiate analgesic: Secondary | ICD-10-CM | POA: Diagnosis not present

## 2022-05-21 DIAGNOSIS — M47816 Spondylosis without myelopathy or radiculopathy, lumbar region: Secondary | ICD-10-CM | POA: Diagnosis not present

## 2022-05-21 DIAGNOSIS — G894 Chronic pain syndrome: Secondary | ICD-10-CM | POA: Diagnosis not present

## 2022-05-25 DIAGNOSIS — R69 Illness, unspecified: Secondary | ICD-10-CM | POA: Diagnosis not present

## 2022-05-25 DIAGNOSIS — M129 Arthropathy, unspecified: Secondary | ICD-10-CM | POA: Diagnosis not present

## 2022-05-25 DIAGNOSIS — R634 Abnormal weight loss: Secondary | ICD-10-CM | POA: Diagnosis not present

## 2022-05-25 DIAGNOSIS — J329 Chronic sinusitis, unspecified: Secondary | ICD-10-CM | POA: Diagnosis not present

## 2022-05-25 DIAGNOSIS — I2510 Atherosclerotic heart disease of native coronary artery without angina pectoris: Secondary | ICD-10-CM | POA: Diagnosis not present

## 2022-05-25 DIAGNOSIS — E871 Hypo-osmolality and hyponatremia: Secondary | ICD-10-CM | POA: Diagnosis not present

## 2022-05-25 DIAGNOSIS — A770 Spotted fever due to Rickettsia rickettsii: Secondary | ICD-10-CM | POA: Diagnosis not present

## 2022-05-25 DIAGNOSIS — G2581 Restless legs syndrome: Secondary | ICD-10-CM | POA: Diagnosis not present

## 2022-05-25 DIAGNOSIS — N401 Enlarged prostate with lower urinary tract symptoms: Secondary | ICD-10-CM | POA: Diagnosis not present

## 2022-05-25 DIAGNOSIS — R7303 Prediabetes: Secondary | ICD-10-CM | POA: Diagnosis not present

## 2022-05-25 DIAGNOSIS — E785 Hyperlipidemia, unspecified: Secondary | ICD-10-CM | POA: Diagnosis not present

## 2022-05-25 DIAGNOSIS — I10 Essential (primary) hypertension: Secondary | ICD-10-CM | POA: Diagnosis not present

## 2022-07-20 DIAGNOSIS — G894 Chronic pain syndrome: Secondary | ICD-10-CM | POA: Diagnosis not present

## 2022-07-20 DIAGNOSIS — M150 Primary generalized (osteo)arthritis: Secondary | ICD-10-CM | POA: Diagnosis not present

## 2022-07-20 DIAGNOSIS — Z79891 Long term (current) use of opiate analgesic: Secondary | ICD-10-CM | POA: Diagnosis not present

## 2022-07-20 DIAGNOSIS — M47816 Spondylosis without myelopathy or radiculopathy, lumbar region: Secondary | ICD-10-CM | POA: Diagnosis not present

## 2022-07-21 DIAGNOSIS — I10 Essential (primary) hypertension: Secondary | ICD-10-CM | POA: Diagnosis not present

## 2022-07-21 DIAGNOSIS — A770 Spotted fever due to Rickettsia rickettsii: Secondary | ICD-10-CM | POA: Diagnosis not present

## 2022-07-21 DIAGNOSIS — E785 Hyperlipidemia, unspecified: Secondary | ICD-10-CM | POA: Diagnosis not present

## 2022-07-21 DIAGNOSIS — E871 Hypo-osmolality and hyponatremia: Secondary | ICD-10-CM | POA: Diagnosis not present

## 2022-07-21 DIAGNOSIS — R7303 Prediabetes: Secondary | ICD-10-CM | POA: Diagnosis not present

## 2022-07-22 DIAGNOSIS — R634 Abnormal weight loss: Secondary | ICD-10-CM | POA: Diagnosis not present

## 2022-07-22 DIAGNOSIS — I10 Essential (primary) hypertension: Secondary | ICD-10-CM | POA: Diagnosis not present

## 2022-07-22 DIAGNOSIS — R69 Illness, unspecified: Secondary | ICD-10-CM | POA: Diagnosis not present

## 2022-07-22 DIAGNOSIS — M129 Arthropathy, unspecified: Secondary | ICD-10-CM | POA: Diagnosis not present

## 2022-07-22 DIAGNOSIS — G2581 Restless legs syndrome: Secondary | ICD-10-CM | POA: Diagnosis not present

## 2022-07-22 DIAGNOSIS — J329 Chronic sinusitis, unspecified: Secondary | ICD-10-CM | POA: Diagnosis not present

## 2022-07-22 DIAGNOSIS — K9041 Non-celiac gluten sensitivity: Secondary | ICD-10-CM | POA: Diagnosis not present

## 2022-07-22 DIAGNOSIS — N401 Enlarged prostate with lower urinary tract symptoms: Secondary | ICD-10-CM | POA: Diagnosis not present

## 2022-07-22 DIAGNOSIS — J208 Acute bronchitis due to other specified organisms: Secondary | ICD-10-CM | POA: Diagnosis not present

## 2022-07-22 DIAGNOSIS — E785 Hyperlipidemia, unspecified: Secondary | ICD-10-CM | POA: Diagnosis not present

## 2022-07-22 DIAGNOSIS — I2510 Atherosclerotic heart disease of native coronary artery without angina pectoris: Secondary | ICD-10-CM | POA: Diagnosis not present

## 2022-07-22 DIAGNOSIS — A770 Spotted fever due to Rickettsia rickettsii: Secondary | ICD-10-CM | POA: Diagnosis not present

## 2022-07-27 ENCOUNTER — Telehealth: Payer: Self-pay | Admitting: Cardiovascular Disease

## 2022-07-27 MED ORDER — AMLODIPINE BESYLATE 5 MG PO TABS: 5.0000 mg | ORAL_TABLET | Freq: Every day | ORAL | 0 refills | Status: DC

## 2022-07-27 MED ORDER — CARVEDILOL 25 MG PO TABS: 25.0000 mg | ORAL_TABLET | Freq: Two times a day (BID) | ORAL | 0 refills | Status: DC

## 2022-07-27 NOTE — Telephone Encounter (Signed)
Requested Prescriptions   Signed Prescriptions Disp Refills   amLODipine (NORVASC) 5 MG tablet 90 tablet 0    Sig: Take 1 tablet (5 mg total) by mouth daily.    Authorizing Provider: Kathlyn Sacramento A    Ordering User: Raelene Bott, Dayvin Aber L   carvedilol (COREG) 25 MG tablet 180 tablet 0    Sig: Take 1 tablet (25 mg total) by mouth 2 (two) times daily with a meal.    Authorizing Provider: Kathlyn Sacramento A    Ordering User: Raelene Bott, Katharine Rochefort L

## 2022-07-27 NOTE — Telephone Encounter (Signed)
*  STAT* If patient is at the pharmacy, call can be transferred to refill team.   1. Which medications need to be refilled? (please list name of each medication and dose if known)  amLODipine (NORVASC) 5 MG tablet carvedilol (COREG) 25 MG tablet  2. Which pharmacy/location (including street and city if local pharmacy) is medication to be sent to? [facility] [place], [place]  3. Do they need a 30 day or 90 day supply? 90 day supplies   Patient is completely out of medication.

## 2022-08-13 DIAGNOSIS — Z79891 Long term (current) use of opiate analgesic: Secondary | ICD-10-CM | POA: Diagnosis not present

## 2022-08-13 DIAGNOSIS — M150 Primary generalized (osteo)arthritis: Secondary | ICD-10-CM | POA: Diagnosis not present

## 2022-08-13 DIAGNOSIS — G894 Chronic pain syndrome: Secondary | ICD-10-CM | POA: Diagnosis not present

## 2022-08-13 DIAGNOSIS — M47816 Spondylosis without myelopathy or radiculopathy, lumbar region: Secondary | ICD-10-CM | POA: Diagnosis not present

## 2022-10-15 DIAGNOSIS — M47816 Spondylosis without myelopathy or radiculopathy, lumbar region: Secondary | ICD-10-CM | POA: Diagnosis not present

## 2022-10-15 DIAGNOSIS — Z79891 Long term (current) use of opiate analgesic: Secondary | ICD-10-CM | POA: Diagnosis not present

## 2022-10-15 DIAGNOSIS — M150 Primary generalized (osteo)arthritis: Secondary | ICD-10-CM | POA: Diagnosis not present

## 2022-10-15 DIAGNOSIS — G894 Chronic pain syndrome: Secondary | ICD-10-CM | POA: Diagnosis not present

## 2022-10-26 DIAGNOSIS — A770 Spotted fever due to Rickettsia rickettsii: Secondary | ICD-10-CM | POA: Diagnosis not present

## 2022-10-26 DIAGNOSIS — N401 Enlarged prostate with lower urinary tract symptoms: Secondary | ICD-10-CM | POA: Diagnosis not present

## 2022-10-26 DIAGNOSIS — I10 Essential (primary) hypertension: Secondary | ICD-10-CM | POA: Diagnosis not present

## 2022-10-26 DIAGNOSIS — I2510 Atherosclerotic heart disease of native coronary artery without angina pectoris: Secondary | ICD-10-CM | POA: Diagnosis not present

## 2022-10-26 DIAGNOSIS — F339 Major depressive disorder, recurrent, unspecified: Secondary | ICD-10-CM | POA: Diagnosis not present

## 2022-10-26 DIAGNOSIS — R634 Abnormal weight loss: Secondary | ICD-10-CM | POA: Diagnosis not present

## 2022-10-26 DIAGNOSIS — E785 Hyperlipidemia, unspecified: Secondary | ICD-10-CM | POA: Diagnosis not present

## 2022-10-26 DIAGNOSIS — E7521 Fabry (-Anderson) disease: Secondary | ICD-10-CM | POA: Diagnosis not present

## 2022-10-26 DIAGNOSIS — J329 Chronic sinusitis, unspecified: Secondary | ICD-10-CM | POA: Diagnosis not present

## 2022-10-26 DIAGNOSIS — G2581 Restless legs syndrome: Secondary | ICD-10-CM | POA: Diagnosis not present

## 2022-10-26 DIAGNOSIS — M129 Arthropathy, unspecified: Secondary | ICD-10-CM | POA: Diagnosis not present

## 2022-10-26 DIAGNOSIS — R69 Illness, unspecified: Secondary | ICD-10-CM | POA: Diagnosis not present

## 2022-10-26 DIAGNOSIS — J208 Acute bronchitis due to other specified organisms: Secondary | ICD-10-CM | POA: Diagnosis not present

## 2022-11-25 ENCOUNTER — Other Ambulatory Visit: Payer: Medicare HMO

## 2022-11-25 ENCOUNTER — Other Ambulatory Visit: Payer: Self-pay | Admitting: Internal Medicine

## 2022-11-25 DIAGNOSIS — E785 Hyperlipidemia, unspecified: Secondary | ICD-10-CM | POA: Diagnosis not present

## 2022-11-26 LAB — LIPID PANEL W/O CHOL/HDL RATIO
Cholesterol, Total: 108 mg/dL (ref 100–199)
HDL: 48 mg/dL (ref >39)
LDL Chol Calc (NIH): 46 mg/dL (ref 0–99)
Triglycerides: 67 mg/dL (ref 0–149)
VLDL Cholesterol Cal: 14 mg/dL (ref 5–40)

## 2022-11-26 LAB — COMPREHENSIVE METABOLIC PANEL
ALT: 12 IU/L (ref 0–44)
AST: 21 IU/L (ref 0–40)
Albumin/Globulin Ratio: 2 (ref 1.2–2.2)
Albumin: 4.5 g/dL (ref 3.9–4.9)
Alkaline Phosphatase: 62 IU/L (ref 44–121)
BUN/Creatinine Ratio: 10 (ref 10–24)
BUN: 8 mg/dL (ref 8–27)
Bilirubin Total: 0.6 mg/dL (ref 0.0–1.2)
CO2: 22 mmol/L (ref 20–29)
Calcium: 9.2 mg/dL (ref 8.6–10.2)
Chloride: 98 mmol/L (ref 96–106)
Creatinine, Ser: 0.83 mg/dL (ref 0.76–1.27)
Globulin, Total: 2.3 g/dL (ref 1.5–4.5)
Glucose: 108 mg/dL — ABNORMAL HIGH (ref 70–99)
Potassium: 4.3 mmol/L (ref 3.5–5.2)
Sodium: 136 mmol/L (ref 134–144)
Total Protein: 6.8 g/dL (ref 6.0–8.5)
eGFR: 97 mL/min/{1.73_m2} (ref >59)

## 2022-11-27 ENCOUNTER — Ambulatory Visit: Payer: Medicare HMO | Admitting: Internal Medicine

## 2022-11-27 ENCOUNTER — Encounter: Payer: Self-pay | Admitting: Internal Medicine

## 2022-11-27 VITALS — BP 120/72 | HR 65 | Ht 73.0 in | Wt 166.6 lb

## 2022-11-27 DIAGNOSIS — Z0001 Encounter for general adult medical examination with abnormal findings: Secondary | ICD-10-CM

## 2022-11-27 DIAGNOSIS — I25810 Atherosclerosis of coronary artery bypass graft(s) without angina pectoris: Secondary | ICD-10-CM

## 2022-11-27 DIAGNOSIS — K551 Chronic vascular disorders of intestine: Secondary | ICD-10-CM | POA: Diagnosis not present

## 2022-11-27 DIAGNOSIS — F411 Generalized anxiety disorder: Secondary | ICD-10-CM

## 2022-11-27 DIAGNOSIS — H68103 Unspecified obstruction of Eustachian tube, bilateral: Secondary | ICD-10-CM | POA: Diagnosis not present

## 2022-11-27 DIAGNOSIS — R69 Illness, unspecified: Secondary | ICD-10-CM | POA: Diagnosis not present

## 2022-11-27 MED ORDER — LEVOCETIRIZINE DIHYDROCHLORIDE 5 MG PO TABS: 5.0000 mg | ORAL_TABLET | ORAL | 0 refills | Status: DC | PRN

## 2022-11-27 MED ORDER — FLUTICASONE PROPIONATE 50 MCG/ACT NA SUSP: 1.0000 | Freq: Every day | NASAL | 0 refills | Status: DC

## 2022-11-27 NOTE — Progress Notes (Signed)
Established Patient Office Visit  Subjective:  [name], male    DOB: 1956-03-17  Age: 67 y.o. MRN: [id number]  Chief Complaint  Patient presents with   Annual Exam    Cpe and lab results    No new complaints, here for lab review and medication refills. Only reports ear fullness which is relieved whenever he blows his nose. Labs reviewed and notable for well controlled lipids with  unremarkable CMP but elevated fasting glucose however admits to not fasting that day.    Past Medical History:  Diagnosis Date   Anginal pain ([place])    Anxiety    Arthritis    Chronic back pain    Coronary artery disease    CABG in 2007 at [facility] with LIMA to LAD, SVG to OM and SVG to right PDA   Hyperlipidemia    Hypertension    Lyme disease    Multilevel degenerative disc disease    Myocardial infarction ([place]) 12/2005   Tobacco use     Social History   Socioeconomic History   Marital status: Married    Spouse name: Not on file   Number of children: Not on file   Years of education: Not on file   Highest education level: Not on file  Occupational History   Not on file  Tobacco Use   Smoking status: Former    Packs/day: 1.00    Years: 45.00    Total pack years: 45.00    Types: Cigarettes    Quit date: 07/09/2020    Years since quitting: 2.3   Smokeless tobacco: Never  Vaping Use   Vaping Use: Every day   Start date: 07/12/2020   Substances: Nicotine-salt  Substance and Sexual Activity   Alcohol use: No   Drug use: No   Sexual activity: Not on file  Other Topics Concern   Not on file  Social History Narrative   Not on file   Social Determinants of Health   Financial Resource Strain: Not on file  Food Insecurity: Not on file  Transportation Needs: Not on file  Physical Activity: Not on file  Stress: Not on file  Social Connections: Not on file  Intimate Partner Violence: Not on file    Family History  Problem Relation Age of Onset   Heart attack Mother     Heart attack Father    Heart attack Sister    Heart attack Brother    Heart attack Brother     Allergies  Allergen Reactions   Morphine And Related Other (See Comments)    Headaches and creepy skin crawling (11/07/20 - pt currently taking without issues)   Propranolol Hives    Review of Systems  Constitutional:  Positive for weight loss.  HENT: Negative.    Eyes: Negative.   Respiratory: Negative.    Cardiovascular: Negative.   Gastrointestinal:  Positive for constipation. Negative for blood in stool.       Loss of appetite  Genitourinary: Negative.   Skin: Negative.   Neurological: Negative.   Endo/Heme/Allergies: Negative.      Objective:   BP 120/72   Pulse 65   Ht 6' 1"$  (1.854 m)   Wt 166 lb 9.6 oz (75.6 kg)   SpO2 95%   BMI 21.98 kg/m   Vitals:   11/27/22 1504  BP: 120/72  Pulse: 65  Height: 6' 1"$  (1.854 m)  Weight: 166 lb 9.6 oz (75.6 kg)  SpO2: 95%  BMI (Calculated):  21.98    Physical Exam Vitals reviewed.  Constitutional:      Appearance: Normal appearance.  HENT:     Head: Normocephalic.     Left Ear: There is no impacted cerumen.     Nose: Nose normal.     Mouth/Throat:     Mouth: Mucous membranes are moist.     Pharynx: No posterior oropharyngeal erythema.  Eyes:     Extraocular Movements: Extraocular movements intact.     Pupils: Pupils are equal, round, and reactive to light.  Cardiovascular:     Rate and Rhythm: Regular rhythm.     Chest Wall: PMI is not displaced.     Pulses: Normal pulses.     Heart sounds: Normal heart sounds. No murmur heard. Pulmonary:     Effort: Pulmonary effort is normal.     Breath sounds: Normal air entry. No rhonchi or rales.  Abdominal:     General: Abdomen is flat. Bowel sounds are normal. There is no distension.     Palpations: Abdomen is soft. There is no hepatomegaly, splenomegaly or mass.     Tenderness: There is no abdominal tenderness.  Musculoskeletal:        General: Normal range of motion.      Cervical back: Normal range of motion and neck supple.     Right lower leg: No edema.     Left lower leg: No edema.  Skin:    General: Skin is warm and dry.  Neurological:     General: No focal deficit present.     Mental Status: He is alert and oriented to person, place, and time.     Cranial Nerves: No cranial nerve deficit.     Motor: No weakness.  Psychiatric:        Mood and Affect: Mood normal.        Behavior: Behavior normal.      No results found for any visits on 11/27/22.  Recent Results (from the past 2160 hour(Janaisa Birkland))  Comprehensive metabolic panel     Status: Abnormal   Collection Time: 11/25/22 12:15 PM  Result Value Ref Range   Glucose 108 (H) 70 - 99 mg/dL   BUN 8 8 - 27 mg/dL   Creatinine, Ser 0.83 0.76 - 1.27 mg/dL   eGFR 97 >59 mL/min/1.73   BUN/Creatinine Ratio 10 10 - 24   Sodium 136 134 - 144 mmol/L   Potassium 4.3 3.5 - 5.2 mmol/L   Chloride 98 96 - 106 mmol/L   CO2 22 20 - 29 mmol/L   Calcium 9.2 8.6 - 10.2 mg/dL   Total Protein 6.8 6.0 - 8.5 g/dL   Albumin 4.5 3.9 - 4.9 g/dL   Globulin, Total 2.3 1.5 - 4.5 g/dL   Albumin/Globulin Ratio 2.0 1.2 - 2.2   Bilirubin Total 0.6 0.0 - 1.2 mg/dL   Alkaline Phosphatase 62 44 - 121 IU/L   AST 21 0 - 40 IU/L   ALT 12 0 - 44 IU/L  Lipid Panel w/o Chol/HDL Ratio     Status: None   Collection Time: 11/25/22 12:15 PM  Result Value Ref Range   Cholesterol, Total 108 100 - 199 mg/dL   Triglycerides 67 0 - 149 mg/dL   HDL 48 >39 mg/dL   VLDL Cholesterol Cal 14 5 - 40 mg/dL   LDL Chol Calc (NIH) 46 0 - 99 mg/dL      Assessment & Plan:   Problem List Items Addressed This Visit   None  1. Coronary artery disease involving coronary bypass graft of native heart without angina pectoris - Comprehensive metabolic panel - Lipid panel - CK, total - CBC With Diff/Platelet  2. Chronic ischemic colitis ([place]) - Ambulatory referral to Vascular Surgery - POC Hemoccult Bld/Stl (1-Cd Office Dx)  3. Generalized  anxiety disorder  4. Obstruction of both eustachian tubes - levocetirizine (XYZAL) 5 MG tablet; Take 1 tablet (5 mg total) by mouth as needed for allergies.  Dispense: 30 tablet; Refill: 0 - fluticasone (FLONASE) 50 MCG/ACT nasal spray; Place 1 spray into both nostrils daily.  Dispense: 16 mL; Refill: 0    No follow-ups on file.   Total time spent: 30 minutes  Volanda Napoleon, MD  11/27/2022

## 2022-11-30 ENCOUNTER — Telehealth: Payer: Self-pay

## 2022-11-30 NOTE — Telephone Encounter (Signed)
Pt has an appt for May 13th.... Wife Tressia Miners is concerned as pt had a weight loss about 60 pounds in less than 7 months without trying... Pt is having N/V and generalized abd pain, denies any other Sx...  I wanted to know if you would see pt during one of the "on call" days. You currently have 2 cancellations for procedures tomorrow... Pt is on Plavix, so I cannot add for a procedure this week... Pt had colonoscopy 2022 w/ pre-cancerous polyp  Please advise

## 2022-12-03 ENCOUNTER — Ambulatory Visit: Payer: Medicare HMO | Admitting: Gastroenterology

## 2022-12-03 ENCOUNTER — Encounter: Payer: Self-pay | Admitting: Gastroenterology

## 2022-12-03 VITALS — BP 153/91 | HR 88 | Temp 97.8°F | Wt 167.0 lb

## 2022-12-03 DIAGNOSIS — R634 Abnormal weight loss: Secondary | ICD-10-CM | POA: Diagnosis not present

## 2022-12-03 DIAGNOSIS — R6881 Early satiety: Secondary | ICD-10-CM | POA: Diagnosis not present

## 2022-12-03 NOTE — Patient Instructions (Signed)
BP rechecked, patient advised to take BP meds as he reports he has not taken today

## 2022-12-03 NOTE — Progress Notes (Signed)
Primary Care Physician: Jodi Marble, MD  Primary Gastroenterologist:  Dr. [name]  Chief Complaint  Patient presents with   Weight Loss   Abdominal Pain   Nausea and Vomiting    HPI: Joel Howe is a 67 y.o. male here who has seen me in the past for colonoscopy.  The patient comes in now with abdominal pain.  The patient reports that his abdominal pain is after eating.  He has lost 60 pounds.  He is quite concerned about the weight loss.  There is no report of any black or bloody stools.  He also reports some nausea.  Past Medical History:  Diagnosis Date   Anginal pain ([place])    Anxiety    Arthritis    Chronic back pain    Coronary artery disease    CABG in 2007 at [facility] with LIMA to LAD, SVG to OM and SVG to right PDA   Hyperlipidemia    Hypertension    Lyme disease    Multilevel degenerative disc disease    Myocardial infarction ([place]) 12/2005   Tobacco use     Current Outpatient Medications  Medication Sig Dispense Refill   ALPRAZolam (XANAX) 1 MG tablet Take 1 mg by mouth 3 (three) times daily.     amLODipine (NORVASC) 5 MG tablet Take 1 tablet (5 mg total) by mouth daily. 90 tablet 0   Ascorbic Acid (VITAMIN C) 1000 MG tablet Take 1,000 mg by mouth daily.     Budeson-Glycopyrrol-Formoterol (BREZTRI AEROSPHERE) 160-9-4.8 MCG/ACT AERO as needed.     carvedilol (COREG) 25 MG tablet Take 1 tablet (25 mg total) by mouth 2 (two) times daily with a meal. 180 tablet 0   Cholecalciferol (VITAMIN D) 50 MCG (2000 UT) CAPS Take by mouth daily.     clopidogrel (PLAVIX) 75 MG tablet Take 75 mg by mouth daily.     cyclobenzaprine (FLEXERIL) 10 MG tablet Take 10 mg by mouth 3 (three) times daily as needed for muscle spasms.     escitalopram (LEXAPRO) 20 MG tablet Take 20 mg by mouth daily.     esomeprazole (NEXIUM) 20 MG capsule Take 20 mg by mouth daily at 12 noon.     fluticasone (FLONASE) 50 MCG/ACT nasal spray Place 1 spray into both nostrils daily. 16 mL 0    furosemide (LASIX) 20 MG tablet daily.     hydrochlorothiazide (HYDRODIURIL) 25 MG tablet daily.     levocetirizine (XYZAL) 5 MG tablet Take 1 tablet (5 mg total) by mouth as needed for allergies. 30 tablet 0   linaclotide (LINZESS) 290 MCG CAPS capsule Take 1 capsule (290 mcg total) by mouth daily before breakfast. 30 capsule 6   lisinopril (PRINIVIL,ZESTRIL) 40 MG tablet Take 40 mg by mouth daily.     Melatonin 10 MG TABS Take by mouth at bedtime.     morphine (MS CONTIN) 60 MG 12 hr tablet 2 (two) times daily.     Multiple Vitamin (MULTIVITAMIN) capsule Take 1 capsule by mouth daily.     Multiple Vitamins-Minerals (CENTRUM SILVER 50+MEN) TABS daily.     oxyCODONE-acetaminophen (PERCOCET) 10-325 MG per tablet Take 1 tablet by mouth every 6 (six) hours as needed for pain.     polyethylene glycol (MIRALAX / GLYCOLAX) 17 g packet Take 17 g by mouth daily as needed.     rOPINIRole (REQUIP) 0.25 MG tablet daily.     rosuvastatin (CRESTOR) 40 MG tablet Take 40 mg by mouth daily.  senna (SENOKOT) 8.6 MG tablet Take 2 tablets by mouth 2 (two) times daily.     tamsulosin (FLOMAX) 0.4 MG CAPS capsule daily.     No current facility-administered medications for this visit.    Allergies as of 12/03/2022 - Review Complete 12/03/2022  Allergen Reaction Noted   Morphine and related Other (See Comments) 05/07/2015   Propranolol Hives 11/25/2018    ROS:  General: Negative for anorexia, weight loss, fever, chills, fatigue, weakness. ENT: Negative for hoarseness, difficulty swallowing , nasal congestion. CV: Negative for chest pain, angina, palpitations, dyspnea on exertion, peripheral edema.  Respiratory: Negative for dyspnea at rest, dyspnea on exertion, cough, sputum, wheezing.  GI: See history of present illness. GU:  Negative for dysuria, hematuria, urinary incontinence, urinary frequency, nocturnal urination.  Endo: Negative for unusual weight change.    Physical Examination:   There were  no vitals taken for this visit.  General: Well-nourished, well-developed in no acute distress.  Eyes: No icterus. Conjunctivae pink. Neuro: Alert and oriented x 3.  Grossly intact. Skin: Warm and dry, no jaundice.   Psych: Alert and cooperative, normal mood and affect.  Labs:    Imaging Studies: No results found.  Assessment and Plan:   Joel Howe is a 67 y.o. y/o male who is now here for a history of a 60 pound weight loss with epigastric pain and postprandial discomfort with nausea.  The patient will be set up for a upper endoscopy to be done tomorrow to look for any gastric pathology that may be causing his symptoms.  The patient and his wife have been explained the plan and agree with it.     [name], MD. Marval Regal    Note: This dictation was prepared with Dragon dictation along with smaller phrase technology. Any transcriptional errors that result from this process are unintentional.

## 2022-12-04 ENCOUNTER — Ambulatory Visit
Admission: RE | Admit: 2022-12-04 | Discharge: 2022-12-04 | Disposition: A | Discharge status: Home / Self Care | Payer: Medicare HMO | Attending: Gastroenterology | Admitting: Gastroenterology

## 2022-12-04 ENCOUNTER — Encounter
Admission: RE | Disposition: A | Discharge status: Home / Self Care | Payer: Self-pay | Source: Home / Self Care | Attending: Gastroenterology

## 2022-12-04 ENCOUNTER — Encounter: Payer: Self-pay | Admitting: Gastroenterology

## 2022-12-04 ENCOUNTER — Other Ambulatory Visit: Payer: Self-pay | Admitting: Cardiovascular Disease

## 2022-12-04 ENCOUNTER — Ambulatory Visit: Payer: Medicare HMO | Admitting: General Practice

## 2022-12-04 ENCOUNTER — Other Ambulatory Visit: Payer: Self-pay

## 2022-12-04 DIAGNOSIS — Z87891 Personal history of nicotine dependence: Secondary | ICD-10-CM | POA: Insufficient documentation

## 2022-12-04 DIAGNOSIS — F419 Anxiety disorder, unspecified: Secondary | ICD-10-CM | POA: Diagnosis not present

## 2022-12-04 DIAGNOSIS — R634 Abnormal weight loss: Secondary | ICD-10-CM

## 2022-12-04 DIAGNOSIS — J449 Chronic obstructive pulmonary disease, unspecified: Secondary | ICD-10-CM | POA: Diagnosis not present

## 2022-12-04 DIAGNOSIS — Z79899 Other long term (current) drug therapy: Secondary | ICD-10-CM | POA: Diagnosis not present

## 2022-12-04 DIAGNOSIS — R6881 Early satiety: Secondary | ICD-10-CM | POA: Diagnosis not present

## 2022-12-04 DIAGNOSIS — R1084 Generalized abdominal pain: Secondary | ICD-10-CM | POA: Diagnosis not present

## 2022-12-04 DIAGNOSIS — K2270 Barrett's esophagus without dysplasia: Secondary | ICD-10-CM | POA: Insufficient documentation

## 2022-12-04 DIAGNOSIS — Z951 Presence of aortocoronary bypass graft: Secondary | ICD-10-CM | POA: Insufficient documentation

## 2022-12-04 DIAGNOSIS — I252 Old myocardial infarction: Secondary | ICD-10-CM | POA: Insufficient documentation

## 2022-12-04 DIAGNOSIS — G8929 Other chronic pain: Secondary | ICD-10-CM | POA: Insufficient documentation

## 2022-12-04 DIAGNOSIS — I2510 Atherosclerotic heart disease of native coronary artery without angina pectoris: Secondary | ICD-10-CM | POA: Diagnosis not present

## 2022-12-04 DIAGNOSIS — Z7951 Long term (current) use of inhaled steroids: Secondary | ICD-10-CM | POA: Diagnosis not present

## 2022-12-04 DIAGNOSIS — E785 Hyperlipidemia, unspecified: Secondary | ICD-10-CM | POA: Diagnosis not present

## 2022-12-04 DIAGNOSIS — I10 Essential (primary) hypertension: Secondary | ICD-10-CM | POA: Insufficient documentation

## 2022-12-04 HISTORY — PX: ESOPHAGOGASTRODUODENOSCOPY (EGD) WITH PROPOFOL: SHX5813

## 2022-12-04 SURGERY — ESOPHAGOGASTRODUODENOSCOPY (EGD) WITH PROPOFOL
Anesthesia: General | Site: Throat

## 2022-12-04 MED ORDER — PANTOPRAZOLE SODIUM 40 MG PO TBEC: 40.0000 mg | DELAYED_RELEASE_TABLET | Freq: Every day | ORAL | 10 refills | Status: DC

## 2022-12-04 MED ORDER — SODIUM CHLORIDE 0.9 % IV SOLN: INTRAVENOUS | Status: DC

## 2022-12-04 MED ORDER — LIDOCAINE HCL (CARDIAC) PF 100 MG/5ML IV SOSY
PREFILLED_SYRINGE | INTRAVENOUS | Status: DC | PRN
  Administered 2022-12-04: 80 mg via INTRAVENOUS

## 2022-12-04 MED ORDER — STERILE WATER FOR IRRIGATION IR SOLN
Status: DC | PRN
  Administered 2022-12-04: 50 mL

## 2022-12-04 MED ORDER — LACTATED RINGERS IV SOLN: INTRAVENOUS | Status: DC

## 2022-12-04 SURGICAL SUPPLY — 8 items
BLOCK BITE 60FR ADLT L/F GRN (MISCELLANEOUS) ×1 IMPLANT
FORCEPS BIOP RAD 4 LRG CAP 4 (CUTTING FORCEPS) IMPLANT
GOWN CVR UNV OPN BCK APRN NK (MISCELLANEOUS) ×2 IMPLANT
GOWN ISOL THUMB LOOP REG UNIV (MISCELLANEOUS) ×2
KIT PRC NS LF DISP ENDO (KITS) ×1 IMPLANT
KIT PROCEDURE OLYMPUS (KITS) ×1
MANIFOLD NEPTUNE II (INSTRUMENTS) ×1 IMPLANT
WATER STERILE IRR 250ML POUR (IV SOLUTION) ×1 IMPLANT

## 2022-12-04 NOTE — H&P (Signed)
Joel Lame, MD [address] [place], [place] [address] Phone:[phone] Fax : [phone]  Primary Care Physician:  Jodi Marble, MD Primary Gastroenterologist:  Dr. Allen Norris  Pre-Procedure History & Physical: HPI:  Joel Howe is a 67 y.o. male is here for an endoscopy.   Past Medical History:  Diagnosis Date   Anginal pain ([place])    Anxiety    Arthritis    Chronic back pain    Coronary artery disease    CABG in 2007 at [facility] with LIMA to LAD, SVG to OM and SVG to right PDA   Hyperlipidemia    Hypertension    Lyme disease    Multilevel degenerative disc disease    Myocardial infarction ([place]) 12/2005   Tobacco use     Past Surgical History:  Procedure Laterality Date   CARDIAC CATHETERIZATION N/A 05/07/2015   Procedure: Left Heart Cath;  Surgeon: Dionisio David, MD;  Location: [facility];  Service: Cardiovascular;  Laterality: N/A;   COLONOSCOPY WITH PROPOFOL N/A 11/18/2020   Procedure: COLONOSCOPY WITH BIOPSY;  Surgeon: Joel Lame, MD;  Location: [facility];  Service: Endoscopy;  Laterality: N/A;   CORONARY ANGIOPLASTY     CORONARY ARTERY BYPASS GRAFT  02/2006   4 vessel.  [facility]   POLYPECTOMY N/A 11/18/2020   Procedure: POLYPECTOMY;  Surgeon: Joel Lame, MD;  Location: [facility];  Service: Endoscopy;  Laterality: N/A;    Prior to Admission medications   Medication Sig Start Date End Date Taking? Authorizing Provider  ALPRAZolam Duanne Moron) 1 MG tablet Take 1 mg by mouth 3 (three) times daily.   Yes [provider]  amLODipine (NORVASC) 5 MG tablet Take 1 tablet (5 mg total) by mouth daily. 07/27/22  Yes Wellington Hampshire, MD  Ascorbic Acid (VITAMIN C) 1000 MG tablet Take 1,000 mg by mouth daily.   Yes [provider]  Budeson-Glycopyrrol-Formoterol (BREZTRI AEROSPHERE) 160-9-4.8 MCG/ACT AERO as needed. 09/26/21  Yes [provider]  carvedilol (COREG) 25 MG tablet Take 1 tablet (25 mg total) by  mouth 2 (two) times daily with a meal. 07/27/22  Yes Wellington Hampshire, MD  Cholecalciferol (VITAMIN D) 50 MCG (2000 UT) CAPS Take by mouth daily.   Yes [provider]  cyclobenzaprine (FLEXERIL) 10 MG tablet Take 10 mg by mouth 3 (three) times daily as needed for muscle spasms.   Yes [provider]  escitalopram (LEXAPRO) 20 MG tablet Take 20 mg by mouth daily.   Yes [provider]  esomeprazole (NEXIUM) 20 MG capsule Take 20 mg by mouth daily at 12 noon.   Yes [provider]  fluticasone (FLONASE) 50 MCG/ACT nasal spray Place 1 spray into both nostrils daily. 11/27/22 12/27/22 Yes Jodi Marble, MD  lisinopril (PRINIVIL,ZESTRIL) 40 MG tablet Take 40 mg by mouth daily.   Yes [provider]  Melatonin 10 MG TABS Take by mouth at bedtime.   Yes [provider]  Multiple Vitamin (MULTIVITAMIN) capsule Take 1 capsule by mouth daily.   Yes [provider]  Multiple Vitamins-Minerals (CENTRUM SILVER 50+MEN) TABS daily. 08/23/20  Yes [provider]  oxyCODONE-acetaminophen (PERCOCET) 10-325 MG per tablet Take 1 tablet by mouth every 6 (six) hours as needed for pain.   Yes [provider]  polyethylene glycol (MIRALAX / GLYCOLAX) 17 g packet Take 17 g by mouth daily as needed.   Yes [provider]  rosuvastatin (CRESTOR) 40 MG tablet Take  40 mg by mouth daily.   Yes [provider]  senna (SENOKOT) 8.6 MG tablet Take 2 tablets by mouth 2 (two) times daily.   Yes [provider]  tamsulosin (FLOMAX) 0.4 MG CAPS capsule daily. 09/26/21  Yes [provider]    Allergies as of 12/03/2022 - Review Complete 12/03/2022  Allergen Reaction Noted   Morphine and related Other (See Comments) 05/07/2015   Propranolol Hives 11/25/2018    Family History  Problem Relation Age of Onset   Heart attack Mother    Heart attack Father    Heart attack Sister    Heart attack Brother    Heart  attack Brother     Social History   Socioeconomic History   Marital status: Married    Spouse name: Not on file   Number of children: Not on file   Years of education: Not on file   Highest education level: Not on file  Occupational History   Not on file  Tobacco Use   Smoking status: Former    Packs/day: 1.00    Years: 45.00    Total pack years: 45.00    Types: Cigarettes    Quit date: 07/09/2020    Years since quitting: 2.4   Smokeless tobacco: Never  Vaping Use   Vaping Use: Every day   Start date: 07/12/2020   Substances: Nicotine-salt  Substance and Sexual Activity   Alcohol use: No   Drug use: No   Sexual activity: Not on file  Other Topics Concern   Not on file  Social History Narrative   Not on file   Social Determinants of Health   Financial Resource Strain: Not on file  Food Insecurity: Not on file  Transportation Needs: Not on file  Physical Activity: Not on file  Stress: Not on file  Social Connections: Not on file  Intimate Partner Violence: Not on file    Review of Systems: See HPI, otherwise negative ROS  Physical Exam: BP (!) 153/83   Pulse 75   Temp 98.1 F (36.7 C) (Temporal)   Resp (!) 21   Ht '6\' 1"'$  (1.854 m)   Wt 74.1 kg   SpO2 96%   BMI 21.54 kg/m  General:   Alert,  pleasant and cooperative in NAD Head:  Normocephalic and atraumatic. Neck:  Supple; no masses or thyromegaly. Lungs:  Clear throughout to auscultation.    Heart:  Regular rate and rhythm. Abdomen:  Soft, nontender and nondistended. Normal bowel sounds, without guarding, and without rebound.   Neurologic:  Alert and  oriented x4;  grossly normal neurologically.  Impression/Plan: Lajuan Lines is here for an endoscopy to be performed for early satiety   Risks, benefits, limitations, and alternatives regarding  endoscopy have been reviewed with the patient.  Questions have been answered.  All parties agreeable.   Joel Lame, MD  12/04/2022, 10:39 AM

## 2022-12-04 NOTE — Anesthesia Preprocedure Evaluation (Signed)
Anesthesia Evaluation  Patient identified by MRN, date of birth, ID band Patient awake    Reviewed: Allergy & Precautions, H&P , NPO status , Patient's Chart, lab work & pertinent test results  Airway Mallampati: III  TM Distance: >3 FB Neck ROM: full    Dental no notable dental hx. (+) Dental Advidsory Given   Pulmonary neg pulmonary ROS, shortness of breath and with exertion, COPD,  COPD inhaler, Patient abstained from smoking., former smoker   Pulmonary exam normal breath sounds clear to auscultation       Cardiovascular hypertension, (-) angina + CAD, + Past MI and + CABG  negative cardio ROS Normal cardiovascular exam Rhythm:regular Rate:Normal     Neuro/Psych  PSYCHIATRIC DISORDERS      Chronic Pain negative neurological ROS  negative psych ROS   GI/Hepatic negative GI ROS, Neg liver ROS,,,  Endo/Other  negative endocrine ROS    Renal/GU negative Renal ROS  negative genitourinary   Musculoskeletal   Abdominal   Peds  Hematology negative hematology ROS (+)   Anesthesia Other Findings Past Medical History: No date: Anginal pain ([facility]) No date: Anxiety No date: Arthritis No date: Chronic back pain No date: Coronary artery disease     Comment:  CABG in 2007 at [facility] with LIMA to LAD, SVG to OM and SVG              to right PDA No date: Hyperlipidemia No date: Hypertension No date: Lyme disease No date: Multilevel degenerative disc disease 12/2005: Myocardial infarction ([place]) No date: Tobacco use  Past Surgical History: 05/07/2015: CARDIAC CATHETERIZATION; N/A     Comment:  Procedure: Left Heart Cath;  Surgeon: Dionisio David,               MD;  Location: [facility];  Service:               Cardiovascular;  Laterality: N/A; 11/18/2020: COLONOSCOPY WITH PROPOFOL; N/A     Comment:  Procedure: COLONOSCOPY WITH BIOPSY;  Surgeon: [name], MD;  Location: [facility];  Service:                Endoscopy;  Laterality: N/A; No date: CORONARY ANGIOPLASTY 02/2006: CORONARY ARTERY BYPASS GRAFT     Comment:  4 vessel.  [facility] 11/18/2020: POLYPECTOMY; N/A     Comment:  Procedure: POLYPECTOMY;  Surgeon: [name], MD;                Location: [place] [facility];  Service: Endoscopy;                Laterality: N/A;  BMI    Body Mass Index: 21.54 kg/m      Reproductive/Obstetrics negative OB ROS                             Anesthesia Physical Anesthesia Plan  ASA: 3  Anesthesia Plan: General   Post-op Pain Management: Minimal or no pain anticipated   Induction: Intravenous  PONV Risk Score and Plan: 3 and Propofol infusion, TIVA and Ondansetron  Airway Management Planned: Nasal Cannula  Additional Equipment: None  Intra-op Plan:   Post-operative Plan:   Informed Consent: I have reviewed the patients History and Physical, chart, labs and discussed the procedure including the risks, benefits and alternatives for the proposed anesthesia with the patient or  authorized representative who has indicated his/her understanding and acceptance.     Dental advisory given  Plan Discussed with: CRNA and Surgeon  Anesthesia Plan Comments: (Discussed risks of anesthesia with patient, including possibility of difficulty with spontaneous ventilation under anesthesia necessitating airway intervention, PONV, and rare risks such as cardiac or respiratory or neurological events, and allergic reactions. Discussed the role of CRNA in patient's perioperative care. Patient understands.)        Anesthesia Quick Evaluation

## 2022-12-04 NOTE — Transfer of Care (Signed)
Immediate Anesthesia Transfer of Care Note  Patient: Joel Howe  Procedure(s) Performed: ESOPHAGOGASTRODUODENOSCOPY (EGD) WITH PROPOFOL (Throat)  Patient Location: PACU  Anesthesia Type: General  Level of Consciousness: awake, alert  and patient cooperative  Airway and Oxygen Therapy: Patient Spontanous Breathing and Patient connected to supplemental oxygen  Post-op Assessment: Post-op Vital signs reviewed, Patient's Cardiovascular Status Stable, Respiratory Function Stable, Patent Airway and No signs of Nausea or vomiting  Post-op Vital Signs: Reviewed and stable  Complications: No notable events documented.

## 2022-12-04 NOTE — Op Note (Signed)
[facility] Gastroenterology Patient Name: Joel Howe Procedure Date: 12/04/2022 10:37 AM MRN: [id number] Account #: [id number] Date of Birth: [birth year] Admit Type: Outpatient Age: 67 Room: [facility] OR ROOM 01 Gender: Male Note Status: Finalized Instrument Name: [id number] Procedure:             Upper GI endoscopy Indications:           Generalized abdominal pain, Weight loss Providers:             [name] MD, MD Referring MD:          Venetia Maxon. Elijio Miles, MD (Referring MD) Medicines:             Propofol per Anesthesia Complications:         No immediate complications. Procedure:             Pre-Anesthesia Assessment:                        - Prior to the procedure, a History and Physical was                         performed, and patient medications and allergies were                         reviewed. The patient's tolerance of previous                         anesthesia was also reviewed. The risks and benefits                         of the procedure and the sedation options and risks                         were discussed with the patient. All questions were                         answered, and informed consent was obtained. Prior                         Anticoagulants: The patient has taken no anticoagulant                         or antiplatelet agents. ASA Grade Assessment: II - A                         patient with mild systemic disease. After reviewing                         the risks and benefits, the patient was deemed in                         satisfactory condition to undergo the procedure.                        After obtaining informed consent, the endoscope was                         passed under direct vision. Throughout the procedure,  the patient's blood pressure, pulse, and oxygen                         saturations were monitored continuously. The Endoscope                         was introduced through the mouth,  and advanced to the                         second part of duodenum. The upper GI endoscopy was                         accomplished without difficulty. The patient tolerated                         the procedure well. Findings:      There were esophageal mucosal changes suspicious for long-segment       Barrett's esophagus present in the lower third of the esophagus. This       was biopsied with a cold forceps for histology.      The entire examined stomach was normal. Biopsies were taken with a cold       forceps for histology.      The examined duodenum was normal. Biopsies were taken with a cold       forceps for histology. Impression:            - Esophageal mucosal changes suspicious for                         long-segment Barrett's esophagus. Biopsied.                        - Normal stomach. Biopsied.                        - Normal examined duodenum. Biopsied. Recommendation:        - Discharge patient to home.                        - Resume previous diet.                        - Continue present medications.                        - Await pathology results. Procedure Code(s):     --- Professional ---                        [phone], Esophagogastroduodenoscopy, flexible,                         transoral; with biopsy, single or multiple Diagnosis Code(s):     --- Professional ---                        R10.84, Generalized abdominal pain                        R63.4, Abnormal weight loss  K22.89, Other specified disease of esophagus CPT copyright 2022 American Medical Association. All rights reserved. The codes documented in this report are preliminary and upon coder review may  be revised to meet current compliance requirements. [name] MD, MD 12/04/2022 10:56:38 AM This report has been signed electronically. Number of Addenda: 0 Note Initiated On: 12/04/2022 10:37 AM Total Procedure Duration: 0 hours 5 minutes 54 seconds  Estimated Blood Loss:   Estimated blood loss: none.      [facility]

## 2022-12-04 NOTE — Anesthesia Postprocedure Evaluation (Signed)
Anesthesia Post Note  Patient: Joel Howe  Procedure(s) Performed: ESOPHAGOGASTRODUODENOSCOPY (EGD) WITH PROPOFOL (Throat)  Patient location during evaluation: PACU Anesthesia Type: General Level of consciousness: awake and alert Pain management: pain level controlled Vital Signs Assessment: post-procedure vital signs reviewed and stable Respiratory status: spontaneous breathing, nonlabored ventilation, respiratory function stable and patient connected to nasal cannula oxygen Cardiovascular status: blood pressure returned to baseline and stable Postop Assessment: no apparent nausea or vomiting Anesthetic complications: no  No notable events documented.   Last Vitals:  Vitals:   12/04/22 0944 12/04/22 1059  BP: (!) 153/83 124/77  Pulse: 75 69  Resp: (!) 21 (!) 22  Temp:  36.6 C  SpO2: 96% 95%    Last Pain:  Vitals:   12/04/22 1059  TempSrc:   PainSc: 0-No pain                 Dimas Millin

## 2022-12-07 ENCOUNTER — Encounter: Payer: Self-pay | Admitting: Gastroenterology

## 2022-12-07 MED ORDER — PROPOFOL 10 MG/ML IV BOLUS
INTRAVENOUS | Status: DC | PRN
  Administered 2022-12-04: 80 mg via INTRAVENOUS

## 2022-12-07 NOTE — Addendum Note (Signed)
Addendum  created 12/07/22 0900 by Moises Blood, CRNA   Intraprocedure Meds edited

## 2022-12-07 NOTE — Telephone Encounter (Signed)
Good Morning,   Could you please schedule a 12 month follow up visit for this patient? The patient was last seen on 11/06/21 by Dr. Fletcher Anon. Thank you so much.

## 2022-12-08 LAB — SURGICAL PATHOLOGY

## 2022-12-10 DIAGNOSIS — M47816 Spondylosis without myelopathy or radiculopathy, lumbar region: Secondary | ICD-10-CM | POA: Diagnosis not present

## 2022-12-10 DIAGNOSIS — M150 Primary generalized (osteo)arthritis: Secondary | ICD-10-CM | POA: Diagnosis not present

## 2022-12-10 DIAGNOSIS — G894 Chronic pain syndrome: Secondary | ICD-10-CM | POA: Diagnosis not present

## 2022-12-10 DIAGNOSIS — Z79891 Long term (current) use of opiate analgesic: Secondary | ICD-10-CM | POA: Diagnosis not present

## 2022-12-28 ENCOUNTER — Other Ambulatory Visit (INDEPENDENT_AMBULATORY_CARE_PROVIDER_SITE_OTHER): Payer: Self-pay | Admitting: Vascular Surgery

## 2022-12-28 DIAGNOSIS — K551 Chronic vascular disorders of intestine: Secondary | ICD-10-CM

## 2022-12-29 ENCOUNTER — Ambulatory Visit (INDEPENDENT_AMBULATORY_CARE_PROVIDER_SITE_OTHER): Payer: Medicare HMO | Admitting: Vascular Surgery

## 2022-12-29 ENCOUNTER — Encounter (INDEPENDENT_AMBULATORY_CARE_PROVIDER_SITE_OTHER): Payer: Self-pay | Admitting: Vascular Surgery

## 2022-12-29 ENCOUNTER — Ambulatory Visit (INDEPENDENT_AMBULATORY_CARE_PROVIDER_SITE_OTHER): Payer: Medicare HMO

## 2022-12-29 VITALS — BP 127/76 | HR 71 | Resp 18 | Ht 73.0 in | Wt 159.9 lb

## 2022-12-29 DIAGNOSIS — E785 Hyperlipidemia, unspecified: Secondary | ICD-10-CM

## 2022-12-29 DIAGNOSIS — R634 Abnormal weight loss: Secondary | ICD-10-CM | POA: Diagnosis not present

## 2022-12-29 DIAGNOSIS — K551 Chronic vascular disorders of intestine: Secondary | ICD-10-CM | POA: Diagnosis not present

## 2022-12-29 DIAGNOSIS — I10 Essential (primary) hypertension: Secondary | ICD-10-CM | POA: Diagnosis not present

## 2022-12-29 NOTE — Progress Notes (Signed)
[name], male   DOB: 07-07-1956, 67 y.o.   MRN: [id number]  Chief Complaint  Patient presents with   New Patient (Initial Visit)    np mesenteric +consult; Chronic ischemic colitis    HPI Joel Howe is a 67 y.o. male.  I am asked to see the patient by Dr. Elijio Miles for evaluation of postprandial abdominal pain and weight loss.  This has been an ongoing and progressive problem now for 8 to 10 months.  Certain foods do seem to irritate him more, but he has been having pain and difficulty with eating now for almost a year.  He has lost about 60 pounds.  This has become quite a miserable problem for him.  He had a recent endoscopy and does have Barrett's esophagus with some gastritis, but did not seem that severe.  He had a colonoscopy about 2 years ago.  He does have a previous history of ischemic colitis about 2-1/2 years ago.  With his classic symptoms of previous history of ischemic colitis, he is referred for mesenteric evaluation.  A mesenteric duplex was performed today which showed normal flow in the celiac, SMA, and IMA without significant stenosis identified.  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   Past Medical History:  Diagnosis Date   Anginal pain ([place])    Anxiety    Arthritis    Chronic back pain    Coronary artery disease    CABG in 2007 at [facility] with LIMA to LAD, SVG to OM and SVG to right PDA   Hyperlipidemia    Hypertension    Lyme disease    Multilevel degenerative disc disease    Myocardial infarction ([place]) 12/2005   Tobacco use     Past Surgical History:  Procedure Laterality Date   CARDIAC CATHETERIZATION N/A 05/07/2015   Procedure: Left Heart Cath;  Surgeon: Dionisio David, MD;  Location: [place] [facility];  Service: Cardiovascular;  Laterality: N/A;   COLONOSCOPY WITH PROPOFOL N/A 11/18/2020   Procedure: COLONOSCOPY WITH BIOPSY;  Surgeon: [name], MD;  Location: [place];  Service: Endoscopy;  Laterality: N/A;   CORONARY ANGIOPLASTY     CORONARY ARTERY BYPASS GRAFT  02/2006   4 vessel.  [facility]   ESOPHAGOGASTRODUODENOSCOPY (EGD) WITH PROPOFOL N/A 12/04/2022   Procedure: ESOPHAGOGASTRODUODENOSCOPY (EGD) WITH PROPOFOL;  Surgeon: [name], MD;  Location: [place];  Service: Endoscopy;  Laterality: N/A;   POLYPECTOMY N/A 11/18/2020   Procedure: POLYPECTOMY;  Surgeon: [name], MD;  Location: [place];  Service: Endoscopy;  Laterality: N/A;     Family History  Problem Relation Age of Onset   Heart attack Mother    Heart attack Father    Heart attack Sister    Heart attack Brother    Heart attack Brother       Social History   Tobacco Use   Smoking status: Former    Packs/day: 1.00    Years: 45.00    Additional pack years: 0.00    Total pack years: 45.00    Types: Cigarettes    Quit date: 07/09/2020    Years since quitting: 2.4   Smokeless tobacco: Never  Vaping Use   Vaping Use: Every day   Start date: 07/12/2020   Substances: Nicotine-salt  Substance Use Topics   Alcohol use: No   Drug  use: No     Allergies  Allergen Reactions   Morphine And Related Other (See Comments)    Headaches and creepy skin crawling (11/07/20 - pt currently taking without issues)   Propranolol Hives    Current Outpatient Medications  Medication Sig Dispense Refill   ALPRAZolam (XANAX) 1 MG tablet Take 1 mg by mouth 3 (three) times daily.     amLODipine (NORVASC) 5 MG tablet Take 1 tablet (5 mg total) by mouth daily. 90 tablet 0   Ascorbic Acid (VITAMIN C) 1000 MG tablet Take 1,000 mg by mouth daily.     Budeson-Glycopyrrol-Formoterol (BREZTRI AEROSPHERE) 160-9-4.8 MCG/ACT AERO as needed.     carvedilol (COREG) 25 MG tablet Take 1 tablet (25 mg total) by mouth 2 (two) times daily with a meal. 180 tablet 0   Cholecalciferol (VITAMIN D) 50 MCG (2000 UT) CAPS Take by mouth daily.      cyclobenzaprine (FLEXERIL) 10 MG tablet Take 10 mg by mouth 3 (three) times daily as needed for muscle spasms.     escitalopram (LEXAPRO) 20 MG tablet Take 20 mg by mouth daily.     esomeprazole (NEXIUM) 20 MG capsule Take 20 mg by mouth daily at 12 noon.     lisinopril (PRINIVIL,ZESTRIL) 40 MG tablet Take 40 mg by mouth daily.     Melatonin 10 MG TABS Take by mouth at bedtime.     Multiple Vitamin (MULTIVITAMIN) capsule Take 1 capsule by mouth daily.     Multiple Vitamins-Minerals (CENTRUM SILVER 50+MEN) TABS daily.     oxyCODONE-acetaminophen (PERCOCET) 10-325 MG per tablet Take 1 tablet by mouth every 6 (six) hours as needed for pain.     pantoprazole (PROTONIX) 40 MG tablet Take 1 tablet (40 mg total) by mouth daily. 30 tablet 10   polyethylene glycol (MIRALAX / GLYCOLAX) 17 g packet Take 17 g by mouth daily as needed.     rosuvastatin (CRESTOR) 40 MG tablet Take 40 mg by mouth daily.     senna (SENOKOT) 8.6 MG tablet Take 2 tablets by mouth 2 (two) times daily.     tamsulosin (FLOMAX) 0.4 MG CAPS capsule daily.     fluticasone (FLONASE) 50 MCG/ACT nasal spray Place 1 spray into both nostrils daily. 16 mL 0   No current facility-administered medications for this visit.      REVIEW OF SYSTEMS (Negative unless checked)  Constitutional: [x] Weight loss  [] Fever  [] Chills Cardiac: [] Chest pain   [] Chest pressure   [] Palpitations   [] Shortness of breath when laying flat   [] Shortness of breath at rest   [] Shortness of breath with exertion. Vascular:  [] Pain in legs with walking   [] Pain in legs at rest   [] Pain in legs when laying flat   [] Claudication   [] Pain in feet when walking  [] Pain in feet at rest  [] Pain in feet when laying flat   [] History of DVT   [] Phlebitis   [] Swelling in legs   [] Varicose veins   [] Non-healing ulcers Pulmonary:   [] Uses home oxygen   [] Productive cough   [] Hemoptysis   [] Wheeze  [] COPD   [] Asthma Neurologic:  [] Dizziness  [] Blackouts   [] Seizures   [] History of  stroke   [] History of TIA  [] Aphasia   [] Temporary blindness   [] Dysphagia   [] Weakness or numbness in arms   [] Weakness or numbness in legs Musculoskeletal:  [x] Arthritis   [] Joint swelling   [] Joint pain   [x] Low back pain Hematologic:  [] Easy bruising  [] Easy  bleeding   [] Hypercoagulable state   [] Anemic  [] Hepatitis Gastrointestinal:  [] Blood in stool   [] Vomiting blood  [] Gastroesophageal reflux/heartburn   [x] Abdominal pain Genitourinary:  [] Chronic kidney disease   [] Difficult urination  [] Frequent urination  [] Burning with urination   [] Hematuria Skin:  [] Rashes   [] Ulcers   [] Wounds Psychological:  [x] History of anxiety   []  History of major depression.    Physical Exam BP 127/76 (BP Location: Right Arm)   Pulse 71   Resp 18   Ht 6\' 1"  (1.854 m)   Wt 159 lb 13.9 oz (72.5 kg)   BMI 21.09 kg/m  Gen:  WD/WN, NAD Head: [place]/AT, No temporalis wasting.  Ear/Nose/Throat: Hearing grossly intact, nares w/o erythema or drainage, oropharynx w/o Erythema/Exudate Eyes: Conjunctiva clear, sclera non-icteric  Neck: trachea midline.  No JVD.  Pulmonary:  Good air movement, respirations not labored, no use of accessory muscles  Cardiac: RRR, no JVD Vascular:  Vessel Right Left  Radial Palpable Palpable                                   Gastrointestinal:. No masses, surgical incisions, or scars. Musculoskeletal: M/S 5/5 throughout.  Extremities without ischemic changes.  No deformity or atrophy. No edema. Neurologic: Sensation grossly intact in extremities.  Symmetrical.  Speech is fluent. Motor exam as listed above. Psychiatric: Judgment intact, Mood & affect appropriate for pt's clinical situation. Dermatologic: No rashes or ulcers noted.  No cellulitis or open wounds.    Radiology No results found.  Labs Recent Results (from the past 2160 hour(s))  Comprehensive metabolic panel     Status: Abnormal   Collection Time: 11/25/22 12:15 PM  Result Value Ref Range   Glucose 108  (H) 70 - 99 mg/dL   BUN 8 8 - 27 mg/dL   Creatinine, Ser 0.83 0.76 - 1.27 mg/dL   eGFR 97 >59 mL/min/1.73   BUN/Creatinine Ratio 10 10 - 24   Sodium 136 134 - 144 mmol/L   Potassium 4.3 3.5 - 5.2 mmol/L   Chloride 98 96 - 106 mmol/L   CO2 22 20 - 29 mmol/L   Calcium 9.2 8.6 - 10.2 mg/dL   Total Protein 6.8 6.0 - 8.5 g/dL   Albumin 4.5 3.9 - 4.9 g/dL   Globulin, Total 2.3 1.5 - 4.5 g/dL   Albumin/Globulin Ratio 2.0 1.2 - 2.2   Bilirubin Total 0.6 0.0 - 1.2 mg/dL   Alkaline Phosphatase 62 44 - 121 IU/L   AST 21 0 - 40 IU/L   ALT 12 0 - 44 IU/L  Lipid Panel w/o Chol/HDL Ratio     Status: None   Collection Time: 11/25/22 12:15 PM  Result Value Ref Range   Cholesterol, Total 108 100 - 199 mg/dL   Triglycerides 67 0 - 149 mg/dL   HDL 48 >39 mg/dL   VLDL Cholesterol Cal 14 5 - 40 mg/dL   LDL Chol Calc (NIH) 46 0 - 99 mg/dL  Surgical pathology     Status: None   Collection Time: 12/04/22 10:34 AM  Result Value Ref Range   SURGICAL PATHOLOGY      SURGICAL PATHOLOGY CASE: [id number] PATIENT: Joel Howe Surgical Pathology Report     Specimen Submitted: A. Small bowel; bx B. Stomach; bx C. Esophagus; bx  Clinical History: Weight loss R63.4.  Early satiety R68.81      DIAGNOSIS: A.  SMALL BOWEL; BIOPSY: - UNREMARKABLE  DUODENAL MUCOSA. - NEGATIVE FOR DYSPLASIA AND MALIGNANCY.  B.  STOMACH; BIOPSY: - ANTRAL MUCOSA WITH MILD REACTIVE GASTRITIS. - OXYNTIC MUCOSA WITH CHANGES CONSISTENT WITH PROTON PUMP INHIBITOR EFFECT. - NEGATIVE FOR H. PYLORI, INTESTINAL METAPLASIA, DYSPLASIA, AND MALIGNANCY.  C.  ESOPHAGUS; BIOPSY: - BARRETT'S ESOPHAGUS, FOCALLY INFLAMED. - NEGATIVE FOR DYSPLASIA AND MALIGNANCY. - DEEPER SECTIONS EXAMINED.  GROSS DESCRIPTION: A. Labeled: Small bowel biopsy Received: Formalin Collection time: 10:34 AM on 12/04/2022 Placed into formalin time: 10:34 AM on 12/04/2022 Tissue fragment(s): 4 Size: Aggregate, 1 x 0.3 x 0.1 cm Description: Tan soft  tissue fragments Entirely submi tted in 1 cassette.  B. Labeled: Gastric biopsy Received: Formalin Collection time: 10:52 AM on 12/04/2022 Placed into formalin time: 10:32 AM on 12/04/2022 Tissue fragment(s): 4 Size: Aggregate, 0.9 x 0.3 x 0.1 cm Description: Tan soft tissue fragments Entirely submitted in 1 cassette.  C. Labeled: Esophageal biopsy Received: Formalin Collection time: 10:53 AM on 12/04/2022 Placed into formalin time: 10:53 AM on 12/04/2022 Tissue fragment(s): 1 Size: 0.4 x 0.3 x 0.1 cm Description: Tan soft tissue fragment Entirely submitted in 1 cassette.  RB 12/07/2022   Final Diagnosis performed by Quay Burow, MD.   Electronically signed 12/08/2022 1:44:27PM The electronic signature indicates that the named Attending Pathologist has evaluated the specimen Technical component performed at [facility], [address], [place], [place] [address] Lab: [phone] Dir: Rush Farmer, MD, MMM  Professional component performed at [facility], [facility], [place], [place], [place] [address] Lab: [phone] Dir: Kathi Simpers, MD     Assessment/Plan:  Loss of weight The patient has very worrisome symptoms of chronic mesenteric ischemia with postprandial abdominal pain and weight loss. A mesenteric duplex was performed today which showed normal flow in the celiac, SMA, and IMA without significant stenosis identified.  However, previous CT scan done at [facility] had suggested SMA stenosis.  With his classic symptoms, I have discussed with him that consideration for an angiogram at this point with possible intervention if true SMA stenosis is identified may be of benefit.  I discussed the risks and benefits of the procedure.  I discussed the pathophysiology and natural history of chronic mesenteric ischemia.  They desire to proceed.  Chronic mesenteric ischemia ([place]) The patient has very worrisome symptoms of chronic mesenteric ischemia with postprandial  abdominal pain and weight loss. A mesenteric duplex was performed today which showed normal flow in the celiac, SMA, and IMA without significant stenosis identified.  However, previous CT scan done at [facility] had suggested SMA stenosis.  With his classic symptoms, I have discussed with him that consideration for an angiogram at this point with possible intervention if true SMA stenosis is identified may be of benefit.  I discussed the risks and benefits of the procedure.  I discussed the pathophysiology and natural history of chronic mesenteric ischemia.  They desire to proceed.  Essential hypertension blood pressure control important in reducing the progression of atherosclerotic disease. On appropriate oral medications.   HLD (hyperlipidemia) lipid control important in reducing the progression of atherosclerotic disease. Continue statin therapy       Leotis Pain 12/29/2022, 1:25 PM   This note was created with Dragon medical transcription system.  Any errors from dictation are unintentional.

## 2022-12-29 NOTE — Assessment & Plan Note (Signed)
The patient has very worrisome symptoms of chronic mesenteric ischemia with postprandial abdominal pain and weight loss. A mesenteric duplex was performed today which showed normal flow in the celiac, SMA, and IMA without significant stenosis identified.  However, previous CT scan done at [facility] had suggested SMA stenosis.  With his classic symptoms, I have discussed with him that consideration for an angiogram at this point with possible intervention if true SMA stenosis is identified may be of benefit.  I discussed the risks and benefits of the procedure.  I discussed the pathophysiology and natural history of chronic mesenteric ischemia.  They desire to proceed.

## 2022-12-29 NOTE — H&P (View-Only) (Signed)
  [name], male   DOB: 12/12/1955, 67 y.o.   MRN: [id number]  Chief Complaint  Patient presents with   New Patient (Initial Visit)    np mesenteric +consult; Chronic ischemic colitis    HPI Joel Howe is a 67 y.o. male.  I am asked to see the patient by Dr. Tejan-Sie for evaluation of postprandial abdominal pain and weight loss.  This has been an ongoing and progressive problem now for 8 to 10 months.  Certain foods do seem to irritate him more, but he has been having pain and difficulty with eating now for almost a year.  He has lost about 60 pounds.  This has become quite a miserable problem for him.  He had a recent endoscopy and does have Barrett's esophagus with some gastritis, but did not seem that severe.  He had a colonoscopy about 2 years ago.  He does have a previous history of ischemic colitis about 2-1/2 years ago.  With his classic symptoms of previous history of ischemic colitis, he is referred for mesenteric evaluation.  A mesenteric duplex was performed today which showed normal flow in the celiac, SMA, and IMA without significant stenosis identified.  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   Past Medical History:  Diagnosis Date   Anginal pain (HCC)    Anxiety    Arthritis    Chronic back pain    Coronary artery disease    CABG in 2007 at [facility] with LIMA to LAD, SVG to OM and SVG to right PDA   Hyperlipidemia    Hypertension    Lyme disease    Multilevel degenerative disc disease    Myocardial infarction (HCC) 12/2005   Tobacco use     Past Surgical History:  Procedure Laterality Date   CARDIAC CATHETERIZATION N/A 05/07/2015   Procedure: Left Heart Cath;  Surgeon: Shaukat A Khan, MD;  Location: [facility];  Service: Cardiovascular;  Laterality: N/A;   COLONOSCOPY WITH PROPOFOL N/A 11/18/2020   Procedure: COLONOSCOPY WITH BIOPSY;  Surgeon: Wohl,  Darren, MD;  Location: [facility];  Service: Endoscopy;  Laterality: N/A;   CORONARY ANGIOPLASTY     CORONARY ARTERY BYPASS GRAFT  02/2006   4 vessel.  [facility]   ESOPHAGOGASTRODUODENOSCOPY (EGD) WITH PROPOFOL N/A 12/04/2022   Procedure: ESOPHAGOGASTRODUODENOSCOPY (EGD) WITH PROPOFOL;  Surgeon: Wohl, Darren, MD;  Location: [facility];  Service: Endoscopy;  Laterality: N/A;   POLYPECTOMY N/A 11/18/2020   Procedure: POLYPECTOMY;  Surgeon: Wohl, Darren, MD;  Location: [facility];  Service: Endoscopy;  Laterality: N/A;     Family History  Problem Relation Age of Onset   Heart attack Mother    Heart attack Father    Heart attack Sister    Heart attack Brother    Heart attack Brother       Social History   Tobacco Use   Smoking status: Former    Packs/day: 1.00    Years: 45.00    Additional pack years: 0.00    Total pack years: 45.00    Types: Cigarettes    Quit date: 07/09/2020    Years since quitting: 2.4   Smokeless tobacco: Never  Vaping Use   Vaping Use: Every day   Start date: 07/12/2020   Substances: Nicotine-salt  Substance Use Topics   Alcohol use: No   Drug   use: No     Allergies  Allergen Reactions   Morphine And Related Other (See Comments)    Headaches and creepy skin crawling (11/07/20 - pt currently taking without issues)   Propranolol Hives    Current Outpatient Medications  Medication Sig Dispense Refill   ALPRAZolam (XANAX) 1 MG tablet Take 1 mg by mouth 3 (three) times daily.     amLODipine (NORVASC) 5 MG tablet Take 1 tablet (5 mg total) by mouth daily. 90 tablet 0   Ascorbic Acid (VITAMIN C) 1000 MG tablet Take 1,000 mg by mouth daily.     Budeson-Glycopyrrol-Formoterol (BREZTRI AEROSPHERE) 160-9-4.8 MCG/ACT AERO as needed.     carvedilol (COREG) 25 MG tablet Take 1 tablet (25 mg total) by mouth 2 (two) times daily with a meal. 180 tablet 0   Cholecalciferol (VITAMIN D) 50 MCG (2000 UT) CAPS Take by mouth daily.      cyclobenzaprine (FLEXERIL) 10 MG tablet Take 10 mg by mouth 3 (three) times daily as needed for muscle spasms.     escitalopram (LEXAPRO) 20 MG tablet Take 20 mg by mouth daily.     esomeprazole (NEXIUM) 20 MG capsule Take 20 mg by mouth daily at 12 noon.     lisinopril (PRINIVIL,ZESTRIL) 40 MG tablet Take 40 mg by mouth daily.     Melatonin 10 MG TABS Take by mouth at bedtime.     Multiple Vitamin (MULTIVITAMIN) capsule Take 1 capsule by mouth daily.     Multiple Vitamins-Minerals (CENTRUM SILVER 50+MEN) TABS daily.     oxyCODONE-acetaminophen (PERCOCET) 10-325 MG per tablet Take 1 tablet by mouth every 6 (six) hours as needed for pain.     pantoprazole (PROTONIX) 40 MG tablet Take 1 tablet (40 mg total) by mouth daily. 30 tablet 10   polyethylene glycol (MIRALAX / GLYCOLAX) 17 g packet Take 17 g by mouth daily as needed.     rosuvastatin (CRESTOR) 40 MG tablet Take 40 mg by mouth daily.     senna (SENOKOT) 8.6 MG tablet Take 2 tablets by mouth 2 (two) times daily.     tamsulosin (FLOMAX) 0.4 MG CAPS capsule daily.     fluticasone (FLONASE) 50 MCG/ACT nasal spray Place 1 spray into both nostrils daily. 16 mL 0   No current facility-administered medications for this visit.      REVIEW OF SYSTEMS (Negative unless checked)  Constitutional: [x]Weight loss  []Fever  []Chills Cardiac: []Chest pain   []Chest pressure   []Palpitations   []Shortness of breath when laying flat   []Shortness of breath at rest   []Shortness of breath with exertion. Vascular:  []Pain in legs with walking   []Pain in legs at rest   []Pain in legs when laying flat   []Claudication   []Pain in feet when walking  []Pain in feet at rest  []Pain in feet when laying flat   []History of DVT   []Phlebitis   []Swelling in legs   []Varicose veins   []Non-healing ulcers Pulmonary:   []Uses home oxygen   []Productive cough   []Hemoptysis   []Wheeze  []COPD   []Asthma Neurologic:  []Dizziness  []Blackouts   []Seizures   []History of  stroke   []History of TIA  []Aphasia   []Temporary blindness   []Dysphagia   []Weakness or numbness in arms   []Weakness or numbness in legs Musculoskeletal:  [x]Arthritis   []Joint swelling   []Joint pain   [x]Low back pain Hematologic:  []Easy bruising  []Easy   bleeding   []Hypercoagulable state   []Anemic  []Hepatitis Gastrointestinal:  []Blood in stool   []Vomiting blood  []Gastroesophageal reflux/heartburn   [x]Abdominal pain Genitourinary:  []Chronic kidney disease   []Difficult urination  []Frequent urination  []Burning with urination   []Hematuria Skin:  []Rashes   []Ulcers   []Wounds Psychological:  [x]History of anxiety   [] History of major depression.    Physical Exam BP 127/76 (BP Location: Right Arm)   Pulse 71   Resp 18   Ht 6' 1" (1.854 m)   Wt 159 lb 13.9 oz (72.5 kg)   BMI 21.09 kg/m  Gen:  WD/WN, NAD Head: [place]/AT, No temporalis wasting.  Ear/Nose/Throat: Hearing grossly intact, nares w/o erythema or drainage, oropharynx w/o Erythema/Exudate Eyes: Conjunctiva clear, sclera non-icteric  Neck: trachea midline.  No JVD.  Pulmonary:  Good air movement, respirations not labored, no use of accessory muscles  Cardiac: RRR, no JVD Vascular:  Vessel Right Left  Radial Palpable Palpable                                   Gastrointestinal:. No masses, surgical incisions, or scars. Musculoskeletal: M/S 5/5 throughout.  Extremities without ischemic changes.  No deformity or atrophy. No edema. Neurologic: Sensation grossly intact in extremities.  Symmetrical.  Speech is fluent. Motor exam as listed above. Psychiatric: Judgment intact, Mood & affect appropriate for pt's clinical situation. Dermatologic: No rashes or ulcers noted.  No cellulitis or open wounds.    Radiology No results found.  Labs Recent Results (from the past 2160 hour(s))  Comprehensive metabolic panel     Status: Abnormal   Collection Time: 11/25/22 12:15 PM  Result Value Ref Range   Glucose 108  (H) 70 - 99 mg/dL   BUN 8 8 - 27 mg/dL   Creatinine, Ser 0.83 0.76 - 1.27 mg/dL   eGFR 97 >59 mL/min/1.73   BUN/Creatinine Ratio 10 10 - 24   Sodium 136 134 - 144 mmol/L   Potassium 4.3 3.5 - 5.2 mmol/L   Chloride 98 96 - 106 mmol/L   CO2 22 20 - 29 mmol/L   Calcium 9.2 8.6 - 10.2 mg/dL   Total Protein 6.8 6.0 - 8.5 g/dL   Albumin 4.5 3.9 - 4.9 g/dL   Globulin, Total 2.3 1.5 - 4.5 g/dL   Albumin/Globulin Ratio 2.0 1.2 - 2.2   Bilirubin Total 0.6 0.0 - 1.2 mg/dL   Alkaline Phosphatase 62 44 - 121 IU/L   AST 21 0 - 40 IU/L   ALT 12 0 - 44 IU/L  Lipid Panel w/o Chol/HDL Ratio     Status: None   Collection Time: 11/25/22 12:15 PM  Result Value Ref Range   Cholesterol, Total 108 100 - 199 mg/dL   Triglycerides 67 0 - 149 mg/dL   HDL 48 >39 mg/dL   VLDL Cholesterol Cal 14 5 - 40 mg/dL   LDL Chol Calc (NIH) 46 0 - 99 mg/dL  Surgical pathology     Status: None   Collection Time: 12/04/22 10:34 AM  Result Value Ref Range   SURGICAL PATHOLOGY      SURGICAL PATHOLOGY CASE: [id number] PATIENT: Joel Howe Surgical Pathology Report     Specimen Submitted: A. Small bowel; bx B. Stomach; bx C. Esophagus; bx  Clinical History: Weight loss R63.4.  Early satiety R68.81      DIAGNOSIS: A.  SMALL BOWEL; BIOPSY: - UNREMARKABLE   DUODENAL MUCOSA. - NEGATIVE FOR DYSPLASIA AND MALIGNANCY.  B.  STOMACH; BIOPSY: - ANTRAL MUCOSA WITH MILD REACTIVE GASTRITIS. - OXYNTIC MUCOSA WITH CHANGES CONSISTENT WITH PROTON PUMP INHIBITOR EFFECT. - NEGATIVE FOR H. PYLORI, INTESTINAL METAPLASIA, DYSPLASIA, AND MALIGNANCY.  C.  ESOPHAGUS; BIOPSY: - BARRETT'S ESOPHAGUS, FOCALLY INFLAMED. - NEGATIVE FOR DYSPLASIA AND MALIGNANCY. - DEEPER SECTIONS EXAMINED.  GROSS DESCRIPTION: A. Labeled: Small bowel biopsy Received: Formalin Collection time: 10:34 AM on 12/04/2022 Placed into formalin time: 10:34 AM on 12/04/2022 Tissue fragment(s): 4 Size: Aggregate, 1 x 0.3 x 0.1 cm Description: Tan soft  tissue fragments Entirely submi tted in 1 cassette.  B. Labeled: Gastric biopsy Received: Formalin Collection time: 10:52 AM on 12/04/2022 Placed into formalin time: 10:32 AM on 12/04/2022 Tissue fragment(s): 4 Size: Aggregate, 0.9 x 0.3 x 0.1 cm Description: Tan soft tissue fragments Entirely submitted in 1 cassette.  C. Labeled: Esophageal biopsy Received: Formalin Collection time: 10:53 AM on 12/04/2022 Placed into formalin time: 10:53 AM on 12/04/2022 Tissue fragment(s): 1 Size: 0.4 x 0.3 x 0.1 cm Description: Tan soft tissue fragment Entirely submitted in 1 cassette.  RB 12/07/2022   Final Diagnosis performed by Tara Rubinas, MD.   Electronically signed 12/08/2022 1:44:27PM The electronic signature indicates that the named Attending Pathologist has evaluated the specimen Technical component performed at [facility], [address], [place], [place] [address] Lab: [phone] Dir: Sanjai Nagendra, MD, MMM  Professional component performed at [facility], [facility], [address], [place], [place] [address] Lab: [phone] Dir: Heath M. Jones, MD     Assessment/Plan:  Loss of weight The patient has very worrisome symptoms of chronic mesenteric ischemia with postprandial abdominal pain and weight loss. A mesenteric duplex was performed today which showed normal flow in the celiac, SMA, and IMA without significant stenosis identified.  However, previous CT scan done at [facility] had suggested SMA stenosis.  With his classic symptoms, I have discussed with him that consideration for an angiogram at this point with possible intervention if true SMA stenosis is identified may be of benefit.  I discussed the risks and benefits of the procedure.  I discussed the pathophysiology and natural history of chronic mesenteric ischemia.  They desire to proceed.  Chronic mesenteric ischemia (HCC) The patient has very worrisome symptoms of chronic mesenteric ischemia with postprandial  abdominal pain and weight loss. A mesenteric duplex was performed today which showed normal flow in the celiac, SMA, and IMA without significant stenosis identified.  However, previous CT scan done at [facility] had suggested SMA stenosis.  With his classic symptoms, I have discussed with him that consideration for an angiogram at this point with possible intervention if true SMA stenosis is identified may be of benefit.  I discussed the risks and benefits of the procedure.  I discussed the pathophysiology and natural history of chronic mesenteric ischemia.  They desire to proceed.  Essential hypertension blood pressure control important in reducing the progression of atherosclerotic disease. On appropriate oral medications.   HLD (hyperlipidemia) lipid control important in reducing the progression of atherosclerotic disease. Continue statin therapy       Kattleya Kuhnert 12/29/2022, 1:25 PM   This note was created with Dragon medical transcription system.  Any errors from dictation are unintentional.   

## 2022-12-29 NOTE — Assessment & Plan Note (Signed)
lipid control important in reducing the progression of atherosclerotic disease. Continue statin therapy  

## 2022-12-29 NOTE — Assessment & Plan Note (Addendum)
The patient has very worrisome symptoms of chronic mesenteric ischemia with postprandial abdominal pain and weight loss. A mesenteric duplex was performed today which showed normal flow in the celiac, SMA, and IMA without significant stenosis identified.  However, previous CT scan done at [facility] had suggested SMA stenosis.  With his classic symptoms, I have discussed with him that consideration for an angiogram at this point with possible intervention if true SMA stenosis is identified may be of benefit.  I discussed the risks and benefits of the procedure.  I discussed the pathophysiology and natural history of chronic mesenteric ischemia.  They desire to proceed.

## 2022-12-29 NOTE — Assessment & Plan Note (Signed)
blood pressure control important in reducing the progression of atherosclerotic disease. On appropriate oral medications.  

## 2023-01-09 ENCOUNTER — Other Ambulatory Visit: Payer: Self-pay | Admitting: Internal Medicine

## 2023-01-13 ENCOUNTER — Encounter (INDEPENDENT_AMBULATORY_CARE_PROVIDER_SITE_OTHER): Payer: Self-pay | Admitting: Vascular Surgery

## 2023-01-14 ENCOUNTER — Telehealth (INDEPENDENT_AMBULATORY_CARE_PROVIDER_SITE_OTHER): Payer: Self-pay

## 2023-01-14 NOTE — Telephone Encounter (Signed)
Spoke with patient's spouse and he is scheduled with Dr. Lucky Cowboy on 01/21/23 with a 11:00 am arrival time to the [facility] for a mesenteric angio with possible stent. Pre-procedure instructions were discussed and will be sent to Mychart and mailed.

## 2023-01-21 ENCOUNTER — Encounter
Admission: RE | Disposition: A | Discharge status: Home / Self Care | Payer: Self-pay | Source: Home / Self Care | Attending: Vascular Surgery

## 2023-01-21 ENCOUNTER — Ambulatory Visit
Admission: RE | Admit: 2023-01-21 | Discharge: 2023-01-21 | Disposition: A | Discharge status: Home / Self Care | Payer: Medicare HMO | Attending: Vascular Surgery | Admitting: Vascular Surgery

## 2023-01-21 ENCOUNTER — Encounter: Payer: Self-pay | Admitting: Vascular Surgery

## 2023-01-21 ENCOUNTER — Other Ambulatory Visit: Payer: Self-pay

## 2023-01-21 DIAGNOSIS — Z681 Body mass index (BMI) 19 or less, adult: Secondary | ICD-10-CM | POA: Diagnosis not present

## 2023-01-21 DIAGNOSIS — Z79899 Other long term (current) drug therapy: Secondary | ICD-10-CM | POA: Insufficient documentation

## 2023-01-21 DIAGNOSIS — R935 Abnormal findings on diagnostic imaging of other abdominal regions, including retroperitoneum: Secondary | ICD-10-CM | POA: Diagnosis not present

## 2023-01-21 DIAGNOSIS — K551 Chronic vascular disorders of intestine: Secondary | ICD-10-CM | POA: Diagnosis not present

## 2023-01-21 DIAGNOSIS — I10 Essential (primary) hypertension: Secondary | ICD-10-CM | POA: Diagnosis not present

## 2023-01-21 DIAGNOSIS — E785 Hyperlipidemia, unspecified: Secondary | ICD-10-CM | POA: Insufficient documentation

## 2023-01-21 DIAGNOSIS — I708 Atherosclerosis of other arteries: Secondary | ICD-10-CM

## 2023-01-21 DIAGNOSIS — R634 Abnormal weight loss: Secondary | ICD-10-CM | POA: Insufficient documentation

## 2023-01-21 HISTORY — PX: VISCERAL ANGIOGRAPHY: CATH118276

## 2023-01-21 LAB — BUN: BUN: 14 mg/dL (ref 8–23)

## 2023-01-21 LAB — CREATININE, SERUM
Creatinine, Ser: 0.78 mg/dL (ref 0.61–1.24)
GFR, Estimated: >60 mL/min (ref >60)

## 2023-01-21 LAB — GLUCOSE, CAPILLARY: Glucose-Capillary: 115 mg/dL — ABNORMAL HIGH (ref 70–99)

## 2023-01-21 SURGERY — VISCERAL ANGIOGRAPHY
Anesthesia: Moderate Sedation

## 2023-01-21 MED ORDER — CEFAZOLIN SODIUM-DEXTROSE 2-4 GM/100ML-% IV SOLN
INTRAVENOUS | Status: AC
  Filled 2023-01-21: qty 100

## 2023-01-21 MED ORDER — METHYLPREDNISOLONE SODIUM SUCC 125 MG IJ SOLR: 125.0000 mg | Freq: Once | INTRAMUSCULAR | Status: DC | PRN

## 2023-01-21 MED ORDER — MIDAZOLAM HCL 2 MG/2ML IJ SOLN
INTRAMUSCULAR | Status: AC
  Filled 2023-01-21: qty 2

## 2023-01-21 MED ORDER — FENTANYL CITRATE (PF) 100 MCG/2ML IJ SOLN
INTRAMUSCULAR | Status: DC | PRN
  Administered 2023-01-21: 50 ug via INTRAVENOUS
  Administered 2023-01-21 (×2): 25 ug via INTRAVENOUS

## 2023-01-21 MED ORDER — DIPHENHYDRAMINE HCL 50 MG/ML IJ SOLN: 50.0000 mg | Freq: Once | INTRAMUSCULAR | Status: DC | PRN

## 2023-01-21 MED ORDER — FENTANYL CITRATE (PF) 100 MCG/2ML IJ SOLN
INTRAMUSCULAR | Status: AC
  Filled 2023-01-21: qty 2

## 2023-01-21 MED ORDER — SODIUM CHLORIDE 0.9 % IV SOLN: INTRAVENOUS | Status: DC

## 2023-01-21 MED ORDER — MIDAZOLAM HCL 2 MG/2ML IJ SOLN
INTRAMUSCULAR | Status: DC | PRN
  Administered 2023-01-21 (×3): 1 mg via INTRAVENOUS

## 2023-01-21 MED ORDER — IODIXANOL 320 MG/ML IV SOLN
INTRAVENOUS | Status: DC | PRN
  Administered 2023-01-21: 70 mL

## 2023-01-21 MED ORDER — ONDANSETRON HCL 4 MG/2ML IJ SOLN: 4.0000 mg | Freq: Four times a day (QID) | INTRAMUSCULAR | Status: DC | PRN

## 2023-01-21 MED ORDER — MIDAZOLAM HCL 2 MG/2ML IJ SOLN
INTRAMUSCULAR | Status: AC
  Filled 2023-01-21: qty 4

## 2023-01-21 MED ORDER — MIDAZOLAM HCL 2 MG/ML PO SYRP: 8.0000 mg | ORAL_SOLUTION | Freq: Once | ORAL | Status: DC | PRN

## 2023-01-21 MED ORDER — FAMOTIDINE 20 MG PO TABS: 40.0000 mg | ORAL_TABLET | Freq: Once | ORAL | Status: DC | PRN

## 2023-01-21 MED ORDER — HYDROMORPHONE HCL 1 MG/ML IJ SOLN: 1.0000 mg | Freq: Once | INTRAMUSCULAR | Status: DC | PRN

## 2023-01-21 MED ORDER — CEFAZOLIN SODIUM-DEXTROSE 2-4 GM/100ML-% IV SOLN
2.0000 g | INTRAVENOUS | Status: AC
  Administered 2023-01-21: 2 g via INTRAVENOUS

## 2023-01-21 MED ORDER — FENTANYL CITRATE PF 50 MCG/ML IJ SOSY
PREFILLED_SYRINGE | INTRAMUSCULAR | Status: AC
  Filled 2023-01-21: qty 1

## 2023-01-21 MED ORDER — HEPARIN SODIUM (PORCINE) 1000 UNIT/ML IJ SOLN
INTRAMUSCULAR | Status: AC
  Filled 2023-01-21: qty 10

## 2023-01-21 SURGICAL SUPPLY — 11 items
CATH ANGIO 5F PIGTAIL 65CM (CATHETERS) IMPLANT
CATH VS15FR (CATHETERS) IMPLANT
COVER PROBE ULTRASOUND 5X96 (MISCELLANEOUS) IMPLANT
DEVICE STARCLOSE SE CLOSURE (Vascular Products) IMPLANT
DEVICE TORQUE (MISCELLANEOUS) IMPLANT
GLIDEWIRE STIFF .35X180X3 HYDR (WIRE) IMPLANT
PACK ANGIOGRAPHY (CUSTOM PROCEDURE TRAY) ×1 IMPLANT
SHEATH BRITE TIP 5FRX11 (SHEATH) IMPLANT
SYR MEDRAD MARK 7 150ML (SYRINGE) IMPLANT
TUBING CONTRAST HIGH PRESS 72 (TUBING) IMPLANT
WIRE GUIDERIGHT .035X150 (WIRE) IMPLANT

## 2023-01-21 NOTE — Op Note (Signed)
[facility]  Percutaneous Study/Intervention Procedural Note   Date: 01/21/2023  Surgeon(s): Festus Barren, MD  Assistants: none  Pre-operative Diagnosis: 1.  Symptoms concerning for chronic mesenteric ischemia 2.  CT scan suggesting significant SMA stenosis   Post-operative diagnosis:  Same  Procedure(s) Performed:             1.  Ultrasound guidance for vascular access right femoral artery femoral artery             2.  Catheter placement into celiac artery, SMA, and IMA from right femoral approach             3.  Aortogram and selective angiogram of the celiac artery, superior mesenteric artery, and inferior mesenteric artery             4.  StarClose closure device right femoral artery  Contrast: 70 cc  Fluoro time: 3.8 minutes  EBL: 5 cc  Anesthesia: Approximately 27 minutes of Moderate conscious sedation using 3 mg of Versed and 100 mcg of Fentanyl              Indications:  Patient is a 67 y.o. male who has symptoms consistent with mesenteric ischemia. The patient has a mesenteric duplex that looked ok but a CT scan showing likely SMA stenosis. The patient is brought in for angiography for further evaluation and potential treatment. Risks and benefits are discussed and informed consent is obtained  Procedure:  The patient was identified and appropriate procedural time out was performed.  The patient was then placed supine on the table and prepped and draped in the usual sterile fashion. Moderate conscious sedation was administered during a face to face encounter with the patient throughout the procedure with my supervision of the RN administering medicines and monitoring the patient's vital signs, pulse oximetry, telemetry and mental status throughout from the start of the procedure until the patient was taken to the recovery room. Ultrasound was used to evaluate the right common femoral artery.  It was patent .  A digital ultrasound image was acquired.  A  Seldinger needle was used to access the right common femoral artery under direct ultrasound guidance and a permanent image was performed.  A 0.035 J wire was advanced without resistance and a 5Fr sheath was placed.  Pigtail catheter was placed into the aorta and an AP aortogram was performed. This demonstrated normal aorta and iliac arteries and no significant stenosis of the renal arteries.  The SMA and celiac arteries could be seen in the course but the origins were not seen. We transitioned to the lateral projection to image the celiac and SMA. The lateral image demonstrated some calcific disease at the origin of both vessels but no obvious stenosis on initial imaging.  Selective imaging was then performed.  A V S1 catheter was used to selectively cannulate the celiac artery first.  Selective imaging showed only about a 20 to 30% stenosis at the origin of the celiac artery that was not limiting flow.  I then used the V S1 catheter to selectively cannulate the superior mesenteric artery and performed imaging.  This demonstrated a no significant disease of the superior mesenteric artery with normal flow in both the lateral and AP projections.  Given his compelling symptoms, I elected to image the IMA as well.  I came down to an RAO projection aortogram and found the origin of the IMA.  The V S1 catheter was used to selectively cannulate the IMA.  This was quite a small vessel but it did not appear to have any focal stenosis and had a normal course and flow of the vessel.  At this point, I elected to terminate the procedure. The diagnostic catheter was removed. StarClose closure device was deployed in usual fashion with excellent hemostatic result. The patient was taken to the recovery room in stable condition having tolerated the procedure well.     Findings: There was less than 30% stenosis of the celiac artery, calcification but no stenosis of the SMA, and a patent IMA which was small but did not appear to  have any significant stenosis.  Disposition: Patient was taken to the recovery room in stable condition having tolerated the procedure well.  Complications:  None  Festus Barren 01/21/2023 1:14 PM   This note was created with Dragon Medical transcription system. Any errors in dictation are purely unintentional.

## 2023-01-21 NOTE — Interval H&P Note (Signed)
History and Physical Interval Note:  01/21/2023 11:52 AM  Joel Howe  has presented today for surgery, with the diagnosis of Mesenteric Angio    Chronic mesenteric ischemia.  The various methods of treatment have been discussed with the patient and family. After consideration of risks, benefits and other options for treatment, the patient has consented to  Procedure(s): VISCERAL ANGIOGRAPHY (N/A) as a surgical intervention.  The patient's history has been reviewed, patient examined, no change in status, stable for surgery.  I have reviewed the patient's chart and labs.  Questions were answered to the patient's satisfaction.     Festus Barren

## 2023-01-22 ENCOUNTER — Encounter: Payer: Self-pay | Admitting: Vascular Surgery

## 2023-01-26 ENCOUNTER — Ambulatory Visit (INDEPENDENT_AMBULATORY_CARE_PROVIDER_SITE_OTHER): Payer: Medicare HMO | Admitting: Family

## 2023-01-26 VITALS — BP 112/74 | HR 85 | Ht 73.0 in | Wt 153.6 lb

## 2023-01-26 DIAGNOSIS — W57XXXS Bitten or stung by nonvenomous insect and other nonvenomous arthropods, sequela: Secondary | ICD-10-CM

## 2023-01-26 DIAGNOSIS — R918 Other nonspecific abnormal finding of lung field: Secondary | ICD-10-CM

## 2023-01-26 DIAGNOSIS — R634 Abnormal weight loss: Secondary | ICD-10-CM | POA: Diagnosis not present

## 2023-01-26 DIAGNOSIS — D509 Iron deficiency anemia, unspecified: Secondary | ICD-10-CM | POA: Diagnosis not present

## 2023-01-26 DIAGNOSIS — S90561S Insect bite (nonvenomous), right ankle, sequela: Secondary | ICD-10-CM | POA: Diagnosis not present

## 2023-01-26 MED ORDER — DOXYCYCLINE HYCLATE 50 MG PO CAPS: 50.0000 mg | ORAL_CAPSULE | Freq: Two times a day (BID) | ORAL | 10 refills | Status: DC

## 2023-01-27 ENCOUNTER — Encounter: Payer: Self-pay | Admitting: Family

## 2023-01-27 LAB — IRON,TIBC AND FERRITIN PANEL
Ferritin: 73 ng/mL (ref 30–400)
Iron Saturation: 33 % (ref 15–55)
Iron: 97 ug/dL (ref 38–169)
Total Iron Binding Capacity: 292 ug/dL (ref 250–450)
UIBC: 195 ug/dL (ref 111–343)

## 2023-01-27 NOTE — Progress Notes (Signed)
Established Patient Office Visit  Subjective:  [name], male    DOB: 1955-11-12  Age: 67 y.o. MRN: [id number]  Chief Complaint  Patient presents with   Follow-up    Losing weight    Patient is here today with concerns of excessive weight loss, fatigue, shortness of breath with exertion.  He got sick last year after a tick bite, and he has been "going downhill since".  He has lost 70 pounds in the last 10 months, and 13 since February.   He did have scans done at [facility] which found some nodules in his lungs at the time, but nothing abdominally (these were done in July).   He has not tried to lose any weight. Is have significant fatigue and DOE.   No other concerns today.     No other concerns at this time.   Past Medical History:  Diagnosis Date   Anginal pain    Anxiety    Arthritis    Chronic back pain    Coronary artery disease    CABG in 2007 at [facility] with LIMA to LAD, SVG to OM and SVG to right PDA   Hyperlipidemia    Hypertension    Lyme disease    Multilevel degenerative disc disease    Myocardial infarction 12/2005   Tobacco use     Past Surgical History:  Procedure Laterality Date   CARDIAC CATHETERIZATION N/A 05/07/2015   Procedure: Left Heart Cath;  Surgeon: Laurier Nancy, MD;  Location: [facility] INVASIVE CV LAB;  Service: Cardiovascular;  Laterality: N/A;   COLONOSCOPY WITH PROPOFOL N/A 11/18/2020   Procedure: COLONOSCOPY WITH BIOPSY;  Surgeon: Midge Minium, MD;  Location: [facility] SURGERY CNTR;  Service: Endoscopy;  Laterality: N/A;   CORONARY ANGIOPLASTY     CORONARY ARTERY BYPASS GRAFT  02/2006   4 vessel.  [facility]   ESOPHAGOGASTRODUODENOSCOPY (EGD) WITH PROPOFOL N/A 12/04/2022   Procedure: ESOPHAGOGASTRODUODENOSCOPY (EGD) WITH PROPOFOL;  Surgeon: Midge Minium, MD;  Location: [facility] SUR[facility];  Service: Endoscopy;  Laterality: N/A;   POLYPECTOMY N/A 11/18/2020   Procedure: POLYPECTOMY;  Surgeon: Midge Minium, MD;  Location: [facility];   Service: Endoscopy;  Laterality: N/A;   VISCERAL ANGIOGRAPHY N/A 01/21/2023   Procedure: VISCERAL ANGIOGRAPHY;  Surgeon: Annice Needy, MD;  Location: [facility] INVASIVE CV LAB;  Service: Cardiovascular;  Laterality: N/A;    Social History   Socioeconomic History   Marital status: Married    Spouse name: Not on file   Number of children: Not on file   Years of education: Not on file   Highest education level: Not on file  Occupational History   Not on file  Tobacco Use   Smoking status: Every Day    Packs/day: 0.50    Years: 45.00    Additional pack years: 0.00    Total pack years: 22.50    Types: Cigarettes    Last attempt to quit: 07/09/2020    Years since quitting: 2.5   Smokeless tobacco: Never  Vaping Use   Vaping Use: Every day   Start date: 07/12/2020   Substances: Nicotine-salt  Substance and Sexual Activity   Alcohol use: No   Drug use: No   Sexual activity: Not on file  Other Topics Concern   Not on file  Social History Narrative   Not on file   Social Determinants of Health   Financial Resource Strain: Not on file  Food Insecurity: Not on file  Transportation Needs:  Not on file  Physical Activity: Not on file  Stress: Not on file  Social Connections: Not on file  Intimate Partner Violence: Not on file    Family History  Problem Relation Age of Onset   Heart attack Mother    Heart attack Father    Heart attack Sister    Heart attack Brother    Heart attack Brother     Allergies  Allergen Reactions   Morphine And Related Other (See Comments)    Headaches and creepy skin crawling (11/07/20 - pt currently taking without issues)   Propranolol Hives    Review of Systems  Constitutional:  Positive for malaise/fatigue and weight loss.  Respiratory:  Positive for shortness of breath.   Gastrointestinal:  Positive for constipation.       Decreased appetite   Neurological:  Positive for dizziness, weakness and headaches.  All other systems reviewed and  are negative.      Objective:   BP 112/74   Pulse 85   Ht 6\' 1"  (1.854 m)   Wt 153 lb 9.6 oz (69.7 kg)   SpO2 96%   BMI 20.27 kg/m   Vitals:   01/26/23 1525  BP: 112/74  Pulse: 85  Height: 6\' 1"  (1.854 m)  Weight: 153 lb 9.6 oz (69.7 kg)  SpO2: 96%  BMI (Calculated): 20.27    Physical Exam Vitals and nursing note reviewed.  Constitutional:      General: He is not in acute distress.    Appearance: Normal appearance. He is underweight. He is ill-appearing.  HENT:     Head: Normocephalic and atraumatic.  Eyes:     Extraocular Movements: Extraocular movements intact.     Conjunctiva/sclera: Conjunctivae normal.     Pupils: Pupils are equal, round, and reactive to light.  Cardiovascular:     Rate and Rhythm: Normal rate and regular rhythm.     Pulses: Normal pulses.     Heart sounds: Normal heart sounds.  Pulmonary:     Effort: Pulmonary effort is normal.     Breath sounds: Normal breath sounds.  Abdominal:     General: Bowel sounds are decreased.  Musculoskeletal:        General: Normal range of motion.     Cervical back: Normal range of motion.  Neurological:     Mental Status: He is alert and oriented to person, place, and time.     Motor: Weakness present.     Gait: Gait abnormal.  Psychiatric:        Mood and Affect: Mood normal.        Behavior: Behavior normal.      Results for orders placed or performed in visit on 01/26/23  Iron, TIBC and Ferritin Panel  Result Value Ref Range   Total Iron Binding Capacity 292 250 - 450 ug/dL   UIBC 409 811 - 914 ug/dL   Iron 97 38 - 782 ug/dL   Iron Saturation 33 15 - 55 %   Ferritin 73 30 - 400 ng/mL    Recent Results (from the past 2160 hour(s))  Comprehensive metabolic panel     Status: Abnormal   Collection Time: 11/25/22 12:15 PM  Result Value Ref Range   Glucose 108 (H) 70 - 99 mg/dL   BUN 8 8 - 27 mg/dL   Creatinine, Ser 9.56 0.76 - 1.27 mg/dL   eGFR 97 >21 HY/QMV/7.84   BUN/Creatinine Ratio 10  10 - 24   Sodium 136 134 - 144  mmol/L   Potassium 4.3 3.5 - 5.2 mmol/L   Chloride 98 96 - 106 mmol/L   CO2 22 20 - 29 mmol/L   Calcium 9.2 8.6 - 10.2 mg/dL   Total Protein 6.8 6.0 - 8.5 g/dL   Albumin 4.5 3.9 - 4.9 g/dL   Globulin, Total 2.3 1.5 - 4.5 g/dL   Albumin/Globulin Ratio 2.0 1.2 - 2.2   Bilirubin Total 0.6 0.0 - 1.2 mg/dL   Alkaline Phosphatase 62 44 - 121 IU/L   AST 21 0 - 40 IU/L   ALT 12 0 - 44 IU/L  Lipid Panel w/o Chol/HDL Ratio     Status: None   Collection Time: 11/25/22 12:15 PM  Result Value Ref Range   Cholesterol, Total 108 100 - 199 mg/dL   Triglycerides 67 0 - 149 mg/dL   HDL 48 >40 mg/dL   VLDL Cholesterol Cal 14 5 - 40 mg/dL   LDL Chol Calc (NIH) 46 0 - 99 mg/dL  Surgical pathology     Status: None   Collection Time: 12/04/22 10:34 AM  Result Value Ref Range   SURGICAL PATHOLOGY      SURGICAL PATHOLOGY CASE: [id number] PATIENT: [name] Surgical Pathology Report     Specimen Submitted: A. Small bowel; bx B. Stomach; bx C. Esophagus; bx  Clinical History: Weight loss R63.4.  Early satiety R68.81      DIAGNOSIS: A.  SMALL BOWEL; BIOPSY: - UNREMARKABLE DUODENAL MUCOSA. - NEGATIVE FOR DYSPLASIA AND MALIGNANCY.  B.  STOMACH; BIOPSY: - ANTRAL MUCOSA WITH MILD REACTIVE GASTRITIS. - OXYNTIC MUCOSA WITH CHANGES CONSISTENT WITH PROTON PUMP INHIBITOR EFFECT. - NEGATIVE FOR H. PYLORI, INTESTINAL METAPLASIA, DYSPLASIA, AND MALIGNANCY.  C.  ESOPHAGUS; BIOPSY: - BARRETT'S ESOPHAGUS, FOCALLY INFLAMED. - NEGATIVE FOR DYSPLASIA AND MALIGNANCY. - DEEPER SECTIONS EXAMINED.  GROSS DESCRIPTION: A. Labeled: Small bowel biopsy Received: Formalin Collection time: 10:34 AM on 12/04/2022 Placed into formalin time: 10:34 AM on 12/04/2022 Tissue fragment(s): 4 Size: Aggregate, 1 x 0.3 x 0.1 cm Description: Tan soft tissue fragments Entirely submi tted in 1 cassette.  B. Labeled: Gastric biopsy Received: Formalin Collection time: 10:52 AM on  12/04/2022 Placed into formalin time: 10:32 AM on 12/04/2022 Tissue fragment(s): 4 Size: Aggregate, 0.9 x 0.3 x 0.1 cm Description: Tan soft tissue fragments Entirely submitted in 1 cassette.  C. Labeled: Esophageal biopsy Received: Formalin Collection time: 10:53 AM on 12/04/2022 Placed into formalin time: 10:53 AM on 12/04/2022 Tissue fragment(s): 1 Size: 0.4 x 0.3 x 0.1 cm Description: Tan soft tissue fragment Entirely submitted in 1 cassette.  RB 12/07/2022   Final Diagnosis performed by Elijah Birk, MD.   Electronically signed 12/08/2022 1:44:27PM The electronic signature indicates that the named Attending Pathologist has evaluated the specimen Technical component performed at [facility], [address] [place], [place] [address] Lab: [phone] Dir: Jolene Schimke, MD, MMM  Professional component performed at [facility], [address] [place], [place], [place] [address] Lab: [phone] Dir: Beryle Quant, MD   BUN     Status: None   Collection Time: 01/21/23 11:44 AM  Result Value Ref Range   BUN 14 8 - 23 mg/dL    Comment: Performed at [facility], [address] [place], [place] [address]  Creatinine, serum     Status: None   Collection Time: 01/21/23 11:44 AM  Result Value Ref Range   Creatinine, Ser 0.78 0.61 - 1.24 mg/dL   GFR, Estimated >10 >93 mL/min    Comment: (NOTE)  Calculated using the CKD-EPI Creatinine Equation (2021) Performed at [facility], [address] [place], [place] [address]   Glucose, capillary     Status: Abnormal   Collection Time: 01/21/23 11:50 AM  Result Value Ref Range   Glucose-Capillary 115 (H) 70 - 99 mg/dL    Comment: Glucose reference range applies only to samples taken after fasting for at least 8 hours.  Iron, TIBC and Ferritin Panel     Status: None   Collection Time: 01/26/23  4:04 PM  Result Value Ref Range   Total Iron Binding Capacity 292 250 - 450 ug/dL   UIBC 604 540 - 981  ug/dL   Iron 97 38 - 191 ug/dL   Iron Saturation 33 15 - 55 %   Ferritin 73 30 - 400 ng/mL       Assessment & Plan:   Problem List Items Addressed This Visit   None Visit Diagnoses     Abnormal weight loss    -  Primary   Relevant Orders   CT CHEST W CONTRAST   CT ABDOMEN PELVIS W WO CONTRAST   Spotted Fever Group Antibodies   Multiple nodules of lung       Relevant Orders   CT CHEST W CONTRAST   CT ABDOMEN PELVIS W WO CONTRAST   Arthropod bite of ankle, right, sequela       Relevant Orders   Rocky mtn spotted fvr abs pnl(IgG+IgM)   Spotted Fever Group Antibodies   Iron deficiency anemia, unspecified iron deficiency anemia type       Relevant Orders   Iron, TIBC and Ferritin Panel (Completed)       No follow-ups on file.   Total time spent: 40 minutes  Miki Kins, FNP  01/26/2023

## 2023-01-28 ENCOUNTER — Ambulatory Visit (INDEPENDENT_AMBULATORY_CARE_PROVIDER_SITE_OTHER): Payer: Medicare HMO

## 2023-01-28 ENCOUNTER — Ambulatory Visit: Payer: Medicare HMO

## 2023-01-28 ENCOUNTER — Other Ambulatory Visit: Payer: Self-pay | Admitting: Internal Medicine

## 2023-01-28 DIAGNOSIS — R918 Other nonspecific abnormal finding of lung field: Secondary | ICD-10-CM

## 2023-01-28 DIAGNOSIS — R634 Abnormal weight loss: Secondary | ICD-10-CM | POA: Diagnosis not present

## 2023-01-28 MED ORDER — IOHEXOL 300 MG/ML  SOLN
100.0000 mL | Freq: Once | INTRAMUSCULAR | Status: AC | PRN
  Administered 2023-01-28: 100 mL via INTRAVENOUS

## 2023-01-30 LAB — SPOTTED FEVER GROUP ANTIBODIES
Spotted Fever Group IgG: <1:64 {titer}
Spotted Fever Group IgM: <1:64 {titer}

## 2023-02-01 ENCOUNTER — Other Ambulatory Visit: Payer: Self-pay | Admitting: Family

## 2023-02-01 ENCOUNTER — Other Ambulatory Visit: Payer: Self-pay

## 2023-02-01 ENCOUNTER — Other Ambulatory Visit: Payer: Medicare HMO

## 2023-02-01 MED ORDER — ALPRAZOLAM 1 MG PO TABS: 1.0000 mg | ORAL_TABLET | Freq: Three times a day (TID) | ORAL | 0 refills | Status: DC

## 2023-02-01 NOTE — Telephone Encounter (Signed)
I have discussed this plan of care with Dr. Neelam Khan  

## 2023-02-03 ENCOUNTER — Encounter: Payer: Self-pay | Admitting: Family

## 2023-02-03 NOTE — Addendum Note (Signed)
Addended by: Grayling Congress on: 02/03/2023 12:35 PM   Modules accepted: Orders

## 2023-02-04 DIAGNOSIS — M47816 Spondylosis without myelopathy or radiculopathy, lumbar region: Secondary | ICD-10-CM | POA: Diagnosis not present

## 2023-02-04 DIAGNOSIS — Z79891 Long term (current) use of opiate analgesic: Secondary | ICD-10-CM | POA: Diagnosis not present

## 2023-02-04 DIAGNOSIS — M150 Primary generalized (osteo)arthritis: Secondary | ICD-10-CM | POA: Diagnosis not present

## 2023-02-04 DIAGNOSIS — G894 Chronic pain syndrome: Secondary | ICD-10-CM | POA: Diagnosis not present

## 2023-02-05 ENCOUNTER — Encounter: Payer: Self-pay | Admitting: Family

## 2023-02-05 ENCOUNTER — Ambulatory Visit (INDEPENDENT_AMBULATORY_CARE_PROVIDER_SITE_OTHER): Payer: Medicare HMO | Admitting: Family

## 2023-02-05 VITALS — BP 112/64 | HR 42 | Ht 73.0 in | Wt 158.0 lb

## 2023-02-05 DIAGNOSIS — R6881 Early satiety: Secondary | ICD-10-CM | POA: Diagnosis not present

## 2023-02-05 DIAGNOSIS — F1120 Opioid dependence, uncomplicated: Secondary | ICD-10-CM | POA: Diagnosis not present

## 2023-02-05 DIAGNOSIS — R634 Abnormal weight loss: Secondary | ICD-10-CM | POA: Diagnosis not present

## 2023-02-05 MED ORDER — DOXYCYCLINE HYCLATE 50 MG PO CAPS: 50.0000 mg | ORAL_CAPSULE | Freq: Two times a day (BID) | ORAL | 10 refills | Status: DC

## 2023-02-09 ENCOUNTER — Encounter: Payer: Self-pay | Admitting: Family

## 2023-02-09 NOTE — Progress Notes (Signed)
Established Patient Office Visit  Subjective:  [name], male    DOB: 01-09-1956  Age: 67 y.o. MRN: [id number]  Chief Complaint  Patient presents with   Follow-up    Follow up    Patient is here for his 1 week follow up. He is much improved since last week, says that he has been able to eat, and that food is tasting much better as well.  After a few days of taking the antibiotics, he says he felt 100 times better.      No other concerns at this time.   Past Medical History:  Diagnosis Date   Anginal pain (HCC)    Anxiety    Arthritis    Chronic back pain    Coronary artery disease    CABG in 2007 at [facility] with LIMA to LAD, SVG to OM and SVG to right PDA   Hyperlipidemia    Hypertension    Lyme disease    Multilevel degenerative disc disease    Myocardial infarction (HCC) 12/2005   Tobacco use     Past Surgical History:  Procedure Laterality Date   CARDIAC CATHETERIZATION N/A 05/07/2015   Procedure: Left Heart Cath;  Surgeon: Laurier Nancy, MD;  Location: [facility] INVASIVE CV LAB;  Service: Cardiovascular;  Laterality: N/A;   COLONOSCOPY WITH PROPOFOL N/A 11/18/2020   Procedure: COLONOSCOPY WITH BIOPSY;  Surgeon: Midge Minium, MD;  Location: [facility]ERY [facility];  Service: Endoscopy;  Laterality: N/A;   CORONARY ANGIOPLASTY     CORONARY ARTERY BYPASS GRAFT  02/2006   4 vessel.  [facility]   ESOPHAGOGASTRODUODENOSCOPY (EGD) WITH PROPOFOL N/A 12/04/2022   Procedure: ESOPHAGOGASTRODUODENOSCOPY (EGD) WITH PROPOFOL;  Surgeon: Midge Minium, MD;  Location: [facility]ERY [facility];  Service: Endoscopy;  Laterality: N/A;   POLYPECTOMY N/A 11/18/2020   Procedure: POLYPECTOMY;  Surgeon: Midge Minium, MD;  Location: [facility]ERY [facility];  Service: Endoscopy;  Laterality: N/A;   VISCERAL ANGIOGRAPHY N/A 01/21/2023   Procedure: VISCERAL ANGIOGRAPHY;  Surgeon: Annice Needy, MD;  Location: [facility] INVASIVE CV LAB;  Service: Cardiovascular;  Laterality: N/A;    Social History    Socioeconomic History   Marital status: Married    Spouse name: Not on file   Number of children: Not on file   Years of education: Not on file   Highest education level: Not on file  Occupational History   Not on file  Tobacco Use   Smoking status: Every Day    Packs/day: 0.50    Years: 45.00    Additional pack years: 0.00    Total pack years: 22.50    Types: Cigarettes    Last attempt to quit: 07/09/2020    Years since quitting: 2.5   Smokeless tobacco: Never  Vaping Use   Vaping Use: Every day   Start date: 07/12/2020   Substances: Nicotine-salt  Substance and Sexual Activity   Alcohol use: No   Drug use: No   Sexual activity: Not on file  Other Topics Concern   Not on file  Social History Narrative   Not on file   Social Determinants of Health   Financial Resource Strain: Not on file  Food Insecurity: Not on file  Transportation Needs: Not on file  Physical Activity: Not on file  Stress: Not on file  Social Connections: Not on file  Intimate Partner Violence: Not on file    Family History  Problem Relation Age of Onset   Heart attack Mother  Heart attack Father    Heart attack Sister    Heart attack Brother    Heart attack Brother     Allergies  Allergen Reactions   Morphine And Related Other (See Comments)    Headaches and creepy skin crawling (11/07/20 - pt currently taking without issues)   Propranolol Hives    Review of Systems  All other systems reviewed and are negative.      Objective:   BP 112/64   Pulse (!) 42   Ht 6\' 1"  (1.854 m)   Wt 158 lb (71.7 kg)   SpO2 98%   BMI 20.85 kg/m   Vitals:   02/05/23 1540  BP: 112/64  Pulse: (!) 42  Height: 6\' 1"  (1.854 m)  Weight: 158 lb (71.7 kg)  SpO2: 98%  BMI (Calculated): 20.85    Physical Exam Vitals and nursing note reviewed.  Constitutional:      Appearance: Normal appearance. He is normal weight.  Eyes:     Pupils: Pupils are equal, round, and reactive to light.   Cardiovascular:     Rate and Rhythm: Normal rate and regular rhythm.     Pulses: Normal pulses.     Heart sounds: Normal heart sounds.  Pulmonary:     Effort: Pulmonary effort is normal.     Breath sounds: Normal breath sounds.  Neurological:     Mental Status: He is alert.  Psychiatric:        Mood and Affect: Mood normal.        Behavior: Behavior normal.        Thought Content: Thought content normal.        Judgment: Judgment normal.      No results found for any visits on 02/05/23.  Recent Results (from the past 2160 hour(s))  Comprehensive metabolic panel     Status: Abnormal   Collection Time: 11/25/22 12:15 PM  Result Value Ref Range   Glucose 108 (H) 70 - 99 mg/dL   BUN 8 8 - 27 mg/dL   Creatinine, Ser 9.14 0.76 - 1.27 mg/dL   eGFR 97 >78 GN/FAO/1.30   BUN/Creatinine Ratio 10 10 - 24   Sodium 136 134 - 144 mmol/L   Potassium 4.3 3.5 - 5.2 mmol/L   Chloride 98 96 - 106 mmol/L   CO2 22 20 - 29 mmol/L   Calcium 9.2 8.6 - 10.2 mg/dL   Total Protein 6.8 6.0 - 8.5 g/dL   Albumin 4.5 3.9 - 4.9 g/dL   Globulin, Total 2.3 1.5 - 4.5 g/dL   Albumin/Globulin Ratio 2.0 1.2 - 2.2   Bilirubin Total 0.6 0.0 - 1.2 mg/dL   Alkaline Phosphatase 62 44 - 121 IU/L   AST 21 0 - 40 IU/L   ALT 12 0 - 44 IU/L  Lipid Panel w/o Chol/HDL Ratio     Status: None   Collection Time: 11/25/22 12:15 PM  Result Value Ref Range   Cholesterol, Total 108 100 - 199 mg/dL   Triglycerides 67 0 - 149 mg/dL   HDL 48 >86 mg/dL   VLDL Cholesterol Cal 14 5 - 40 mg/dL   LDL Chol Calc (NIH) 46 0 - 99 mg/dL  Surgical pathology     Status: None   Collection Time: 12/04/22 10:34 AM  Result Value Ref Range   SURGICAL PATHOLOGY      SURGICAL PATHOLOGY CASE: [id number] PATIENT: Joel Howe Surgical Pathology Report     Specimen Submitted: A. Small bowel; bx B. Stomach; bx  C. Esophagus; bx  Clinical History: Weight loss R63.4.  Early satiety R68.81      DIAGNOSIS: A.  SMALL BOWEL;  BIOPSY: - UNREMARKABLE DUODENAL MUCOSA. - NEGATIVE FOR DYSPLASIA AND MALIGNANCY.  B.  STOMACH; BIOPSY: - ANTRAL MUCOSA WITH MILD REACTIVE GASTRITIS. - OXYNTIC MUCOSA WITH CHANGES CONSISTENT WITH PROTON PUMP INHIBITOR EFFECT. - NEGATIVE FOR H. PYLORI, INTESTINAL METAPLASIA, DYSPLASIA, AND MALIGNANCY.  C.  ESOPHAGUS; BIOPSY: - BARRETT'S ESOPHAGUS, FOCALLY INFLAMED. - NEGATIVE FOR DYSPLASIA AND MALIGNANCY. - DEEPER SECTIONS EXAMINED.  GROSS DESCRIPTION: A. Labeled: Small bowel biopsy Received: Formalin Collection time: 10:34 AM on 12/04/2022 Placed into formalin time: 10:34 AM on 12/04/2022 Tissue fragment(s): 4 Size: Aggregate, 1 x 0.3 x 0.1 cm Description: Tan soft tissue fragments Entirely submi tted in 1 cassette.  B. Labeled: Gastric biopsy Received: Formalin Collection time: 10:52 AM on 12/04/2022 Placed into formalin time: 10:32 AM on 12/04/2022 Tissue fragment(s): 4 Size: Aggregate, 0.9 x 0.3 x 0.1 cm Description: Tan soft tissue fragments Entirely submitted in 1 cassette.  C. Labeled: Esophageal biopsy Received: Formalin Collection time: 10:53 AM on 12/04/2022 Placed into formalin time: 10:53 AM on 12/04/2022 Tissue fragment(s): 1 Size: 0.4 x 0.3 x 0.1 cm Description: Tan soft tissue fragment Entirely submitted in 1 cassette.  RB 12/07/2022   Final Diagnosis performed by Elijah Birk, MD.   Electronically signed 12/08/2022 1:44:27PM The electronic signature indicates that the named Attending Pathologist has evaluated the specimen Technical component performed at [facility], [address] [place], [place] [address] Lab: [phone] Dir: Jolene Schimke, MD, MMM  Professional component performed at [facility], [address] [place], [place], [place] [address] Lab: [phone] Dir: Beryle Quant, MD   BUN     Status: None   Collection Time: 01/21/23 11:44 AM  Result Value Ref Range   BUN 14 8 - 23 mg/dL    Comment: Performed at [facility], [address] [place], [place] [address]  Creatinine, serum     Status: None   Collection Time: 01/21/23 11:44 AM  Result Value Ref Range   Creatinine, Ser 0.78 0.61 - 1.24 mg/dL   GFR, Estimated >96 >29 mL/min    Comment: (NOTE) Calculated using the CKD-EPI Creatinine Equation (2021) Performed at [facility], [address] [place], [place] [address]   Glucose, capillary     Status: Abnormal   Collection Time: 01/21/23 11:50 AM  Result Value Ref Range   Glucose-Capillary 115 (H) 70 - 99 mg/dL    Comment: Glucose reference range applies only to samples taken after fasting for at least 8 hours.  Iron, TIBC and Ferritin Panel     Status: None   Collection Time: 01/26/23  4:04 PM  Result Value Ref Range   Total Iron Binding Capacity 292 250 - 450 ug/dL   UIBC 324 401 - 027 ug/dL   Iron 97 38 - 253 ug/dL   Iron Saturation 33 15 - 55 %   Ferritin 73 30 - 400 ng/mL  Spotted Fever Group Antibodies     Status: None   Collection Time: 01/26/23  4:14 PM  Result Value Ref Range   Spotted Fever Group IgG <1:64 Neg:<1:64   Spotted Fever Group IgM <1:64 Neg:<1:64   Result Comment Comment     Comment: Spotted Fever Group IgG serum endpoint titer of >=1:64 is suggestive of infection at an unknown time and may be a sign of either past infection or early response to a recent infection.  Spotted Fever Group IgM titer of >=1:64 is regarded as probable evidence of recent or ongoing infection. A four-fold or greater increase in titer between two serum samples drawn 1-2 weeks apart and tested in parallel is the best serologic indicator of a recent rickettsial infection.        Assessment & Plan:   Problem List Items Addressed This Visit       Other   Opioid dependence (HCC)    Patient takes medications and is monitored carefully by pain clinic.  Will defer to them for follow up and management.        Early satiety - Primary   Other Visit Diagnoses      Abnormal weight loss       Patient is much better today after the round of antibiotics, and he has started to gain weight.  Sending another round of these so he can restart if needed       Return in about 2 weeks (around 02/19/2023) for F/U.   Total time spent: 20 minutes  Miki Kins, FNP  02/05/2023

## 2023-02-09 NOTE — Assessment & Plan Note (Signed)
Patient takes medications and is monitored carefully by pain clinic.  Will defer to them for follow up and management.

## 2023-02-14 MED ORDER — TESTOSTERONE 12.5 MG/ACT (1%) TD GEL: 1.0000 | Freq: Every day | TRANSDERMAL | 2 refills | Status: DC

## 2023-02-17 ENCOUNTER — Other Ambulatory Visit: Payer: Self-pay | Admitting: Family

## 2023-02-17 ENCOUNTER — Other Ambulatory Visit: Payer: Medicare HMO

## 2023-02-17 DIAGNOSIS — R7303 Prediabetes: Secondary | ICD-10-CM

## 2023-02-17 DIAGNOSIS — E782 Mixed hyperlipidemia: Secondary | ICD-10-CM | POA: Diagnosis not present

## 2023-02-17 DIAGNOSIS — R634 Abnormal weight loss: Secondary | ICD-10-CM | POA: Diagnosis not present

## 2023-02-17 DIAGNOSIS — R5383 Other fatigue: Secondary | ICD-10-CM

## 2023-02-17 DIAGNOSIS — E538 Deficiency of other specified B group vitamins: Secondary | ICD-10-CM

## 2023-02-17 DIAGNOSIS — I10 Essential (primary) hypertension: Secondary | ICD-10-CM | POA: Diagnosis not present

## 2023-02-17 DIAGNOSIS — E559 Vitamin D deficiency, unspecified: Secondary | ICD-10-CM

## 2023-02-18 LAB — CBC WITH DIFFERENTIAL
Basophils Absolute: 0 10*3/uL (ref 0.0–0.2)
Basos: 1 %
EOS (ABSOLUTE): 0.1 10*3/uL (ref 0.0–0.4)
Eos: 1 %
Hematocrit: 46.1 % (ref 37.5–51.0)
Hemoglobin: 15.8 g/dL (ref 13.0–17.7)
Immature Grans (Abs): 0 10*3/uL (ref 0.0–0.1)
Immature Granulocytes: 0 %
Lymphocytes Absolute: 1.6 10*3/uL (ref 0.7–3.1)
Lymphs: 26 %
MCH: 32.6 pg (ref 26.6–33.0)
MCHC: 34.3 g/dL (ref 31.5–35.7)
MCV: 95 fL (ref 79–97)
Monocytes Absolute: 0.6 10*3/uL (ref 0.1–0.9)
Monocytes: 9 %
Neutrophils Absolute: 3.8 10*3/uL (ref 1.4–7.0)
Neutrophils: 63 %
RBC: 4.84 x10E6/uL (ref 4.14–5.80)
RDW: 12.7 % (ref 11.6–15.4)
WBC: 6 10*3/uL (ref 3.4–10.8)

## 2023-02-18 LAB — CMP14+EGFR
ALT: 20 [IU]/L (ref 0–44)
AST: 21 [IU]/L (ref 0–40)
Albumin/Globulin Ratio: 2 (ref 1.2–2.2)
Albumin: 4.7 g/dL (ref 3.9–4.9)
Alkaline Phosphatase: 58 [IU]/L (ref 44–121)
BUN/Creatinine Ratio: 9 — ABNORMAL LOW (ref 10–24)
BUN: 8 mg/dL (ref 8–27)
Bilirubin Total: 0.6 mg/dL (ref 0.0–1.2)
CO2: 24 mmol/L (ref 20–29)
Calcium: 9.6 mg/dL (ref 8.6–10.2)
Chloride: 99 mmol/L (ref 96–106)
Creatinine, Ser: 0.87 mg/dL (ref 0.76–1.27)
Globulin, Total: 2.3 g/dL (ref 1.5–4.5)
Glucose: 103 mg/dL — ABNORMAL HIGH (ref 70–99)
Potassium: 4.6 mmol/L (ref 3.5–5.2)
Sodium: 137 mmol/L (ref 134–144)
Total Protein: 7 g/dL (ref 6.0–8.5)
eGFR: 96 mL/min/{1.73_m2}

## 2023-02-18 LAB — LIPID PANEL
Chol/HDL Ratio: 2.6 ratio (ref 0.0–5.0)
Cholesterol, Total: 111 mg/dL (ref 100–199)
HDL: 42 mg/dL (ref >39)
LDL Chol Calc (NIH): 53 mg/dL (ref 0–99)
Triglycerides: 81 mg/dL (ref 0–149)
VLDL Cholesterol Cal: 16 mg/dL (ref 5–40)

## 2023-02-18 LAB — TSH: TSH: 4 u[IU]/mL (ref 0.450–4.500)

## 2023-02-18 LAB — VITAMIN D 25 HYDROXY (VIT D DEFICIENCY, FRACTURES): Vit D, 25-Hydroxy: 38.2 ng/mL (ref 30.0–100.0)

## 2023-02-18 LAB — VITAMIN B12: Vitamin B-12: 1804 pg/mL — ABNORMAL HIGH (ref 232–1245)

## 2023-02-18 LAB — HEMOGLOBIN A1C
Est. average glucose Bld gHb Est-mCnc: 120 mg/dL
Hgb A1c MFr Bld: 5.8 % — ABNORMAL HIGH (ref 4.8–5.6)

## 2023-02-19 ENCOUNTER — Ambulatory Visit (INDEPENDENT_AMBULATORY_CARE_PROVIDER_SITE_OTHER): Payer: Medicare HMO | Admitting: Family

## 2023-02-19 ENCOUNTER — Encounter: Payer: Self-pay | Admitting: Family

## 2023-02-19 VITALS — BP 118/72 | HR 77 | Ht 73.0 in | Wt 156.0 lb

## 2023-02-19 DIAGNOSIS — R7303 Prediabetes: Secondary | ICD-10-CM | POA: Diagnosis not present

## 2023-02-19 DIAGNOSIS — R5383 Other fatigue: Secondary | ICD-10-CM

## 2023-02-19 DIAGNOSIS — R634 Abnormal weight loss: Secondary | ICD-10-CM | POA: Diagnosis not present

## 2023-02-19 DIAGNOSIS — R5381 Other malaise: Secondary | ICD-10-CM | POA: Diagnosis not present

## 2023-02-19 DIAGNOSIS — F1120 Opioid dependence, uncomplicated: Secondary | ICD-10-CM | POA: Diagnosis not present

## 2023-02-19 MED ORDER — ALPRAZOLAM 1 MG PO TABS: 1.0000 mg | ORAL_TABLET | Freq: Three times a day (TID) | ORAL | 3 refills | Status: DC

## 2023-02-19 MED ORDER — ESCITALOPRAM OXALATE 20 MG PO TABS: 20.0000 mg | ORAL_TABLET | Freq: Every day | ORAL | 1 refills | Status: DC

## 2023-02-22 ENCOUNTER — Ambulatory Visit: Payer: Medicare HMO | Admitting: Gastroenterology

## 2023-02-22 NOTE — Progress Notes (Deleted)
Primary Care Physician: Miki Kins, FNP  Primary Gastroenterologist:  Dr. Midge Minium  No chief complaint on file.   HPI: Joel KNEELAND is a 67 y.o. male here for follow-up of weight loss.  The patient had been reported to have lost 70 pounds.  The patient an upper endoscopy consistent with Barrett's esophagus but his colonoscopy done in 2022 showed 2 polyps which were a tubular adenoma and a sessile serrated polyp.  The patient was seen by his primary care provider at the end of last month and had reported some increase in appetite.  Past Medical History:  Diagnosis Date   Anginal pain (HCC)    Anxiety    Arthritis    Chronic back pain    Coronary artery disease    CABG in 2007 at [facility] with LIMA to LAD, SVG to OM and SVG to right PDA   Hyperlipidemia    Hypertension    Lyme disease    Multilevel degenerative disc disease    Myocardial infarction (HCC) 12/2005   Tobacco use     Current Outpatient Medications  Medication Sig Dispense Refill   Testosterone 12.5 MG/ACT (1%) GEL Place 1 Pump onto the skin daily. Apply on upper arms or back. Rotate sites. 75 g 2   ALPRAZolam (XANAX) 1 MG tablet Take 1 tablet (1 mg total) by mouth 3 (three) times daily. 90 tablet 3   amLODipine (NORVASC) 5 MG tablet Take 1 tablet (5 mg total) by mouth daily. 90 tablet 0   Ascorbic Acid (VITAMIN C) 1000 MG tablet Take 1,000 mg by mouth daily.     Budeson-Glycopyrrol-Formoterol (BREZTRI AEROSPHERE) 160-9-4.8 MCG/ACT AERO as needed.     carvedilol (COREG) 25 MG tablet Take 1 tablet (25 mg total) by mouth 2 (two) times daily with a meal. 180 tablet 0   Cholecalciferol (VITAMIN D) 50 MCG (2000 UT) CAPS Take by mouth daily.     clopidogrel (PLAVIX) 75 MG tablet Take 75 mg by mouth daily.     cyclobenzaprine (FLEXERIL) 10 MG tablet Take 10 mg by mouth 3 (three) times daily as needed for muscle spasms.     doxycycline (VIBRAMYCIN) 50 MG capsule Take 1 capsule (50 mg total) by mouth 2 (two) times  daily. 20 capsule 10   escitalopram (LEXAPRO) 20 MG tablet Take 1 tablet (20 mg total) by mouth daily. 90 tablet 1   fluticasone (FLONASE) 50 MCG/ACT nasal spray Place 1 spray into both nostrils daily. 16 mL 0   lisinopril (ZESTRIL) 40 MG tablet TAKE 1 TABLET BY MOUTH EVERY MORNING 100 tablet 0   Melatonin 10 MG TABS Take by mouth at bedtime.     Multiple Vitamin (MULTIVITAMIN) capsule Take 1 capsule by mouth daily.     Multiple Vitamins-Minerals (CENTRUM SILVER 50+MEN) TABS daily.     oxyCODONE ER (XTAMPZA ER) 13.5 MG C12A Take by mouth.     oxyCODONE-acetaminophen (PERCOCET) 10-325 MG per tablet Take 1 tablet by mouth every 6 (six) hours as needed for pain.     pantoprazole (PROTONIX) 40 MG tablet Take 1 tablet (40 mg total) by mouth daily. 30 tablet 10   rosuvastatin (CRESTOR) 40 MG tablet TAKE 1 TABLET BY MOUTH EVERY DAY AT BEDTIME 100 tablet 2   tamsulosin (FLOMAX) 0.4 MG CAPS capsule daily.     No current facility-administered medications for this visit.    Allergies as of 02/22/2023 - Review Complete 02/09/2023  Allergen Reaction Noted   Morphine and related  Other (See Comments) 05/07/2015   Propranolol Hives 11/25/2018    ROS:  General: Negative for anorexia, weight loss, fever, chills, fatigue, weakness. ENT: Negative for hoarseness, difficulty swallowing , nasal congestion. CV: Negative for chest pain, angina, palpitations, dyspnea on exertion, peripheral edema.  Respiratory: Negative for dyspnea at rest, dyspnea on exertion, cough, sputum, wheezing.  GI: See history of present illness. GU:  Negative for dysuria, hematuria, urinary incontinence, urinary frequency, nocturnal urination.  Endo: Negative for unusual weight change.    Physical Examination:   There were no vitals taken for this visit.  General: Well-nourished, well-developed in no acute distress.  Eyes: No icterus. Conjunctivae pink. Lungs: Clear to auscultation bilaterally. Non-labored. Heart: Regular rate  and rhythm, no murmurs rubs or gallops.  Abdomen: Bowel sounds are normal, nontender, nondistended, no hepatosplenomegaly or masses, no abdominal bruits or hernia , no rebound or guarding.   Extremities: No lower extremity edema. No clubbing or deformities. Neuro: Alert and oriented x 3.  Grossly intact. Skin: Warm and dry, no jaundice.   Psych: Alert and cooperative, normal mood and affect.  Labs:    Imaging Studies: No results found.  Assessment and Plan:   [name] is a 67 y.o. y/o male ***     Midge Minium, MD. Clementeen Graham    Note: This dictation was prepared with Dragon dictation along with smaller phrase technology. Any transcriptional errors that result from this process are unintentional.

## 2023-02-23 ENCOUNTER — Encounter: Payer: Self-pay | Admitting: Family

## 2023-02-23 NOTE — Assessment & Plan Note (Signed)
Patient is managed well, he sees a pain clinic for his meds, and they keep him under surveillance with a pain contract.

## 2023-02-23 NOTE — Progress Notes (Signed)
Established Patient Office Visit  Subjective:  [name], male    DOB: 21-Aug-1956  Age: 67 y.o. MRN: [id number]  Chief Complaint  Patient presents with   Follow-up    2 week follow up    Patient is here today for 2 week follow up.  He is doing much better and says that he still feels well.   Had labs drawn last week, and they look good overall.  He is working on Print production planner, and he says that he is seeing an improvement from the testosterone as well.  He has a considerable amount of deconditioning from the last year.   No other concerns at this time.   Past Medical History:  Diagnosis Date   Anginal pain (HCC)    Anxiety    Arthritis    Chronic back pain    Coronary artery disease    CABG in 2007 at [facility] with LIMA to LAD, SVG to OM and SVG to right PDA   Hyperlipidemia    Hypertension    Lyme disease    Multilevel degenerative disc disease    Myocardial infarction (HCC) 12/2005   Tobacco use     Past Surgical History:  Procedure Laterality Date   CARDIAC CATHETERIZATION N/A 05/07/2015   Procedure: Left Heart Cath;  Surgeon: Laurier Nancy, MD;  Location: [facility] IN[facility];  Service: Cardiovascular;  Laterality: N/A;   COLONOSCOPY WITH PROPOFOL N/A 11/18/2020   Procedure: COLONOSCOPY WITH BIOPSY;  Surgeon: Midge Minium, MD;  Location: [facility];  Service: Endoscopy;  Laterality: N/A;   CORONARY ANGIOPLASTY     CORONARY ARTERY BYPASS GRAFT  02/2006   4 vessel.  [facility]   ESOPHAGOGASTRODUODENOSCOPY (EGD) WITH PROPOFOL N/A 12/04/2022   Procedure: ESOPHAGOGASTRODUODENOSCOPY (EGD) WITH PROPOFOL;  Surgeon: Midge Minium, MD;  Location: [facility];  Service: Endoscopy;  Laterality: N/A;   POLYPECTOMY N/A 11/18/2020   Procedure: POLYPECTOMY;  Surgeon: Midge Minium, MD;  Location: [facility];  Service: Endoscopy;  Laterality: N/A;   VISCERAL ANGIOGRAPHY N/A 01/21/2023   Procedure: VISCERAL ANGIOGRAPHY;  Surgeon: Annice Needy, MD;   Location: [facility] INV[facility] CV [facility];  Service: Cardiovascular;  Laterality: N/A;    Social History   Socioeconomic History   Marital status: Married    Spouse name: Not on file   Number of children: Not on file   Years of education: Not on file   Highest education level: Not on file  Occupational History   Not on file  Tobacco Use   Smoking status: Every Day    Packs/day: 0.50    Years: 45.00    Additional pack years: 0.00    Total pack years: 22.50    Types: Cigarettes    Last attempt to quit: 07/09/2020    Years since quitting: 2.6   Smokeless tobacco: Never  Vaping Use   Vaping Use: Every day   Start date: 07/12/2020   Substances: Nicotine-salt  Substance and Sexual Activity   Alcohol use: No   Drug use: No   Sexual activity: Not on file  Other Topics Concern   Not on file  Social History Narrative   Not on file   Social Determinants of Health   Financial Resource Strain: Not on file  Food Insecurity: Not on file  Transportation Needs: Not on file  Physical Activity: Not on file  Stress: Not on file  Social Connections: Not on file  Intimate Partner Violence: Not on file  Family History  Problem Relation Age of Onset   Heart attack Mother    Heart attack Father    Heart attack Sister    Heart attack Brother    Heart attack Brother     Allergies  Allergen Reactions   Morphine And Related Other (See Comments)    Headaches and creepy skin crawling (11/07/20 - pt currently taking without issues)   Propranolol Hives    Review of Systems  Constitutional:  Positive for malaise/fatigue.  Psychiatric/Behavioral:  The patient is nervous/anxious.   All other systems reviewed and are negative.      Objective:   BP 118/72   Pulse 77   Ht 6\' 1"  (1.854 m)   Wt 156 lb (70.8 kg)   SpO2 96%   BMI 20.58 kg/m   Vitals:   02/19/23 1431  BP: 118/72  Pulse: 77  Height: 6\' 1"  (1.854 m)  Weight: 156 lb (70.8 kg)  SpO2: 96%  BMI (Calculated): 20.59     Physical Exam Vitals and nursing note reviewed.  Constitutional:      Appearance: Normal appearance. He is normal weight.  HENT:     Head: Normocephalic and atraumatic.     Nose: Nose normal.  Eyes:     Extraocular Movements: Extraocular movements intact.     Conjunctiva/sclera: Conjunctivae normal.     Pupils: Pupils are equal, round, and reactive to light.  Cardiovascular:     Rate and Rhythm: Normal rate and regular rhythm.     Pulses: Normal pulses.     Heart sounds: Normal heart sounds.  Pulmonary:     Effort: Pulmonary effort is normal.     Breath sounds: Normal breath sounds.  Abdominal:     General: Abdomen is flat.  Musculoskeletal:        General: Normal range of motion.  Neurological:     General: No focal deficit present.     Mental Status: He is alert and oriented to person, place, and time. Mental status is at baseline.  Psychiatric:        Mood and Affect: Mood normal.        Behavior: Behavior normal.        Thought Content: Thought content normal.        Judgment: Judgment normal.      No results found for any visits on 02/19/23.  Recent Results (from the past 2160 hour(s))  Surgical pathology     Status: None   Collection Time: 12/04/22 10:34 AM  Result Value Ref Range   SURGICAL PATHOLOGY      SURGICAL PATHOLOGY CASE: [id number] PATIENT: [name] Surgical Pathology Report     Specimen Submitted: A. Small bowel; bx B. Stomach; bx C. Esophagus; bx  Clinical History: Weight loss R63.4.  Early satiety R68.81      DIAGNOSIS: A.  SMALL BOWEL; BIOPSY: - UNREMARKABLE DUODENAL MUCOSA. - NEGATIVE FOR DYSPLASIA AND MALIGNANCY.  B.  STOMACH; BIOPSY: - ANTRAL MUCOSA WITH MILD REACTIVE GASTRITIS. - OXYNTIC MUCOSA WITH CHANGES CONSISTENT WITH PROTON PUMP INHIBITOR EFFECT. - NEGATIVE FOR H. PYLORI, INTESTINAL METAPLASIA, DYSPLASIA, AND MALIGNANCY.  C.  ESOPHAGUS; BIOPSY: - BARRETT'S ESOPHAGUS, FOCALLY INFLAMED. - NEGATIVE FOR  DYSPLASIA AND MALIGNANCY. - DEEPER SECTIONS EXAMINED.  GROSS DESCRIPTION: A. Labeled: Small bowel biopsy Received: Formalin Collection time: 10:34 AM on 12/04/2022 Placed into formalin time: 10:34 AM on 12/04/2022 Tissue fragment(s): 4 Size: Aggregate, 1 x 0.3 x 0.1 cm Description: Tan soft tissue fragments Entirely submi tted in  1 cassette.  B. Labeled: Gastric biopsy Received: Formalin Collection time: 10:52 AM on 12/04/2022 Placed into formalin time: 10:32 AM on 12/04/2022 Tissue fragment(s): 4 Size: Aggregate, 0.9 x 0.3 x 0.1 cm Description: Tan soft tissue fragments Entirely submitted in 1 cassette.  C. Labeled: Esophageal biopsy Received: Formalin Collection time: 10:53 AM on 12/04/2022 Placed into formalin time: 10:53 AM on 12/04/2022 Tissue fragment(s): 1 Size: 0.4 x 0.3 x 0.1 cm Description: Tan soft tissue fragment Entirely submitted in 1 cassette.  RB 12/07/2022   Final Diagnosis performed by Elijah Birk, MD.   Electronically signed 12/08/2022 1:44:27PM The electronic signature indicates that the named Attending Pathologist has evaluated the specimen Technical component performed at [facility], [address] [place], [place] [address] Lab: [phone] Dir: Jolene Schimke, MD, MMM  Professional component performed at [facility], [facility], [address] [place], [place], [place] [address] Lab: [phone] Dir: Beryle Quant, MD   BUN     Status: None   Collection Time: 01/21/23 11:44 AM  Result Value Ref Range   BUN 14 8 - 23 mg/dL    Comment: Performed at [facility], [address] [place], [place] [address]  Creatinine, serum     Status: None   Collection Time: 01/21/23 11:44 AM  Result Value Ref Range   Creatinine, Ser 0.78 0.61 - 1.24 mg/dL   GFR, Estimated >52 >84 mL/min    Comment: (NOTE) Calculated using the CKD-EPI Creatinine Equation (2021) Performed at [facility], [address] [place], [place] [address]    Glucose, capillary     Status: Abnormal   Collection Time: 01/21/23 11:50 AM  Result Value Ref Range   Glucose-Capillary 115 (H) 70 - 99 mg/dL    Comment: Glucose reference range applies only to samples taken after fasting for at least 8 hours.  Iron, TIBC and Ferritin Panel     Status: None   Collection Time: 01/26/23  4:04 PM  Result Value Ref Range   Total Iron Binding Capacity 292 250 - 450 ug/dL   UIBC 010 272 - 536 ug/dL   Iron 97 38 - 644 ug/dL   Iron Saturation 33 15 - 55 %   Ferritin 73 30 - 400 ng/mL  Spotted Fever Group Antibodies     Status: None   Collection Time: 01/26/23  4:14 PM  Result Value Ref Range   Spotted Fever Group IgG <1:64 Neg:<1:64   Spotted Fever Group IgM <1:64 Neg:<1:64   Result Comment Comment     Comment: Spotted Fever Group IgG serum endpoint titer of >=1:64 is suggestive of infection at an unknown time and may be a sign of either past infection or early response to a recent infection. Spotted Fever Group IgM titer of >=1:64 is regarded as probable evidence of recent or ongoing infection. A four-fold or greater increase in titer between two serum samples drawn 1-2 weeks apart and tested in parallel is the best serologic indicator of a recent rickettsial infection.   Vitamin B12     Status: Abnormal   Collection Time: 02/17/23  1:05 PM  Result Value Ref Range   Vitamin B-12 1,804 (H) 232 - 1,245 pg/mL  Hemoglobin A1c     Status: Abnormal   Collection Time: 02/17/23  1:05 PM  Result Value Ref Range   Hgb A1c MFr Bld 5.8 (H) 4.8 - 5.6 %    Comment:          Prediabetes: 5.7 - 6.4  Diabetes: >6.4          Glycemic control for adults with diabetes: <7.0    Est. average glucose Bld gHb Est-mCnc 120 mg/dL  TSH     Status: None   Collection Time: 02/17/23  1:05 PM  Result Value Ref Range   TSH 4.000 0.450 - 4.500 uIU/mL  CMP14+EGFR     Status: Abnormal   Collection Time: 02/17/23  1:05 PM  Result Value Ref Range   Glucose 103 (H) 70  - 99 mg/dL   BUN 8 8 - 27 mg/dL   Creatinine, Ser 1.61 0.76 - 1.27 mg/dL   eGFR 96 >09 UE/AVW/0.98   BUN/Creatinine Ratio 9 (L) 10 - 24   Sodium 137 134 - 144 mmol/L   Potassium 4.6 3.5 - 5.2 mmol/L   Chloride 99 96 - 106 mmol/L   CO2 24 20 - 29 mmol/L   Calcium 9.6 8.6 - 10.2 mg/dL   Total Protein 7.0 6.0 - 8.5 g/dL   Albumin 4.7 3.9 - 4.9 g/dL   Globulin, Total 2.3 1.5 - 4.5 g/dL   Albumin/Globulin Ratio 2.0 1.2 - 2.2   Bilirubin Total 0.6 0.0 - 1.2 mg/dL   Alkaline Phosphatase 58 44 - 121 IU/L   AST 21 0 - 40 IU/L   ALT 20 0 - 44 IU/L  CBC With Differential     Status: None   Collection Time: 02/17/23  1:05 PM  Result Value Ref Range   WBC 6.0 3.4 - 10.8 x10E3/uL   RBC 4.84 4.14 - 5.80 x10E6/uL   Hemoglobin 15.8 13.0 - 17.7 g/dL   Hematocrit 11.9 14.7 - 51.0 %   MCV 95 79 - 97 fL   MCH 32.6 26.6 - 33.0 pg   MCHC 34.3 31.5 - 35.7 g/dL   RDW 82.9 56.2 - 13.0 %   Neutrophils 63 Not Estab. %   Lymphs 26 Not Estab. %   Monocytes 9 Not Estab. %   Eos 1 Not Estab. %   Basos 1 Not Estab. %   Neutrophils Absolute 3.8 1.4 - 7.0 x10E3/uL   Lymphocytes Absolute 1.6 0.7 - 3.1 x10E3/uL   Monocytes Absolute 0.6 0.1 - 0.9 x10E3/uL   EOS (ABSOLUTE) 0.1 0.0 - 0.4 x10E3/uL   Basophils Absolute 0.0 0.0 - 0.2 x10E3/uL   Immature Granulocytes 0 Not Estab. %   Immature Grans (Abs) 0.0 0.0 - 0.1 x10E3/uL  VITAMIN D 25 Hydroxy (Vit-D Deficiency, Fractures)     Status: None   Collection Time: 02/17/23  1:05 PM  Result Value Ref Range   Vit D, 25-Hydroxy 38.2 30.0 - 100.0 ng/mL    Comment: Vitamin D deficiency has been defined by the Institute of Medicine and an Endocrine Society practice guideline as a level of serum 25-OH vitamin D less than 20 ng/mL (1,2). The Endocrine Society went on to further define vitamin D insufficiency as a level between 21 and 29 ng/mL (2). 1. IOM (Institute of Medicine). 2010. Dietary reference    intakes for calcium and D. [place]: The    [employer]. 2. Holick MF, Binkley [place], Bischoff-Ferrari HA, et al.    Evaluation, treatment, and prevention of vitamin D    deficiency: an Endocrine Society clinical practice    guideline. JCEM. 2011 Jul; 96(7):1911-30.   Lipid panel     Status: None   Collection Time: 02/17/23  1:05 PM  Result Value Ref Range   Cholesterol, Total 111 100 - 199 mg/dL  Triglycerides 81 0 - 149 mg/dL   HDL 42 >16 mg/dL   VLDL Cholesterol Cal 16 5 - 40 mg/dL   LDL Chol Calc (NIH) 53 0 - 99 mg/dL   Chol/HDL Ratio 2.6 0.0 - 5.0 ratio    Comment:                                   T. Chol/HDL Ratio                                             Men  Women                               1/2 Avg.Risk  3.4    3.3                                   Avg.Risk  5.0    4.4                                2X Avg.Risk  9.6    7.1                                3X Avg.Risk 23.4   11.0        Assessment & Plan:   Problem List Items Addressed This Visit       Active Problems   Opioid dependence (HCC) - Primary   Fatigue   Prediabetes   Other Visit Diagnoses     Abnormal weight loss       Patient is doing well and has stabilized.  He says that his diet has continued to improve, as has his appetite.       Return in about 2 months (around 04/21/2023) for F/U.   Total time spent: 20 minutes  Miki Kins, FNP  02/19/2023

## 2023-02-26 ENCOUNTER — Ambulatory Visit: Payer: Medicare HMO | Admitting: Internal Medicine

## 2023-03-14 ENCOUNTER — Other Ambulatory Visit: Payer: Self-pay | Admitting: Family

## 2023-03-14 ENCOUNTER — Other Ambulatory Visit: Payer: Self-pay | Admitting: Cardiovascular Disease

## 2023-03-15 MED ORDER — CLOPIDOGREL BISULFATE 75 MG PO TABS: 75.0000 mg | ORAL_TABLET | Freq: Every day | ORAL | 1 refills | Status: DC

## 2023-03-15 NOTE — Telephone Encounter (Signed)
Good Morning,   Could you schedule this patient a 12 month follow up visit? This patient was last seen by Dr. Kirke Corin on 11-06-21. Thank you so much.

## 2023-03-17 MED ORDER — CARVEDILOL 25 MG PO TABS: 25.0000 mg | ORAL_TABLET | Freq: Two times a day (BID) | ORAL | 0 refills | Status: DC

## 2023-04-01 DIAGNOSIS — G894 Chronic pain syndrome: Secondary | ICD-10-CM | POA: Diagnosis not present

## 2023-04-01 DIAGNOSIS — M47816 Spondylosis without myelopathy or radiculopathy, lumbar region: Secondary | ICD-10-CM | POA: Diagnosis not present

## 2023-04-01 DIAGNOSIS — M150 Primary generalized (osteo)arthritis: Secondary | ICD-10-CM | POA: Diagnosis not present

## 2023-04-01 DIAGNOSIS — Z79891 Long term (current) use of opiate analgesic: Secondary | ICD-10-CM | POA: Diagnosis not present

## 2023-04-15 ENCOUNTER — Other Ambulatory Visit: Payer: Self-pay | Admitting: Cardiovascular Disease

## 2023-04-16 NOTE — Telephone Encounter (Signed)
Please schedule overdue office visit for further refills. Thank you! 

## 2023-04-22 ENCOUNTER — Ambulatory Visit: Payer: Medicare HMO | Admitting: Family

## 2023-04-23 ENCOUNTER — Other Ambulatory Visit: Payer: Self-pay | Admitting: Nurse Practitioner

## 2023-05-02 ENCOUNTER — Encounter: Payer: Self-pay | Admitting: Family

## 2023-05-06 ENCOUNTER — Ambulatory Visit (INDEPENDENT_AMBULATORY_CARE_PROVIDER_SITE_OTHER): Payer: Medicare HMO | Admitting: Family

## 2023-05-06 VITALS — BP 130/75 | HR 80 | Ht 73.0 in | Wt 156.0 lb

## 2023-05-06 DIAGNOSIS — F1120 Opioid dependence, uncomplicated: Secondary | ICD-10-CM | POA: Diagnosis not present

## 2023-05-06 DIAGNOSIS — E291 Testicular hypofunction: Secondary | ICD-10-CM

## 2023-05-06 DIAGNOSIS — R7303 Prediabetes: Secondary | ICD-10-CM

## 2023-05-06 DIAGNOSIS — E782 Mixed hyperlipidemia: Secondary | ICD-10-CM

## 2023-05-06 DIAGNOSIS — G9339 Other post infection and related fatigue syndromes: Secondary | ICD-10-CM | POA: Diagnosis not present

## 2023-05-06 DIAGNOSIS — I10 Essential (primary) hypertension: Secondary | ICD-10-CM | POA: Diagnosis not present

## 2023-05-06 DIAGNOSIS — F419 Anxiety disorder, unspecified: Secondary | ICD-10-CM | POA: Diagnosis not present

## 2023-05-06 DIAGNOSIS — E538 Deficiency of other specified B group vitamins: Secondary | ICD-10-CM

## 2023-05-06 DIAGNOSIS — E559 Vitamin D deficiency, unspecified: Secondary | ICD-10-CM

## 2023-05-09 ENCOUNTER — Encounter: Payer: Self-pay | Admitting: Family

## 2023-05-09 NOTE — Assessment & Plan Note (Signed)
A1C Continues to be in prediabetic ranges.  Will reassess at follow up after next lab check.  Patient counseled on dietary choices and verbalized understanding.  Patient educated on foods that contain carbohydrates and the need to decrease intake.  We discussed prediabetes, and what it means and the need for strict dietary control to prevent progression to type 2 diabetes.  Advised to decrease intake of sugary drinks, including sodas, sweet tea, and some juices, and of starch and sugar heavy foods (ie., potatoes, rice, bread, pasta, desserts). He verbalizes understanding and agreement with the changes discussed today.  

## 2023-05-09 NOTE — Assessment & Plan Note (Signed)
Blood pressure well controlled with current medications.  Continue current therapy.  Will reassess at follow up.  

## 2023-05-09 NOTE — Assessment & Plan Note (Signed)
Checking labs today.  Continue current therapy for lipid control. Will modify as needed based on labwork results.  

## 2023-05-09 NOTE — Assessment & Plan Note (Signed)
Patient stable.  Well controlled with current therapy.   Continue current meds.  

## 2023-05-09 NOTE — Progress Notes (Signed)
Established Patient Office Visit  Subjective:  [name], male    DOB: Dec 23, 1955  Age: 67 y.o. MRN: [id number]  Chief Complaint  Patient presents with   Follow-up    2 mo F/U    Patient is here today for his 2 months follow up.  He has been feeling fairly well since last appointment.   He does not have additional concerns to discuss today.  Labs are due today. He needs refills.   I have reviewed his active problem list, medication list, allergies, notes from last encounter, lab results for his appointment today.   No other concerns at this time.   Past Medical History:  Diagnosis Date   Anginal pain (HCC)    Anxiety    Arthritis    Chronic back pain    Coronary artery disease    CABG in 2007 at [facility] with LIMA to LAD, SVG to OM and SVG to right PDA   Hyperlipidemia    Hypertension    Lyme disease    Multilevel degenerative disc disease    Myocardial infarction (HCC) 12/2005   Tobacco use     Past Surgical History:  Procedure Laterality Date   CARDIAC CATHETERIZATION N/A 05/07/2015   Procedure: Left Heart Cath;  Surgeon: Laurier Nancy, MD;  Location: [facility];  Service: Cardiovascular;  Laterality: N/A;   COLONOSCOPY WITH PROPOFOL N/A 11/18/2020   Procedure: COLONOSCOPY WITH BIOPSY;  Surgeon: Midge Minium, MD;  Location: [facility];  Service: Endoscopy;  Laterality: N/A;   CORONARY ANGIOPLASTY     CORONARY ARTERY BYPASS GRAFT  02/2006   4 vessel.  [facility]   ESOPHAGOGASTRODUODENOSCOPY (EGD) WITH PROPOFOL N/A 12/04/2022   Procedure: ESOPHAGOGASTRODUODENOSCOPY (EGD) WITH PROPOFOL;  Surgeon: Midge Minium, MD;  Location: [facility];  Service: Endoscopy;  Laterality: N/A;   POLYPECTOMY N/A 11/18/2020   Procedure: POLYPECTOMY;  Surgeon: Midge Minium, MD;  Location: [facility];  Service: Endoscopy;  Laterality: N/A;   VISCERAL ANGIOGRAPHY N/A 01/21/2023   Procedure: VISCERAL ANGIOGRAPHY;  Surgeon: Annice Needy, MD;  Location:  [facility];  Service: Cardiovascular;  Laterality: N/A;    Social History   Socioeconomic History   Marital status: Married    Spouse name: Not on file   Number of children: Not on file   Years of education: Not on file   Highest education level: Not on file  Occupational History   Not on file  Tobacco Use   Smoking status: Every Day    Current packs/day: 0.00    Average packs/day: 0.5 packs/day for 45.0 years (22.5 ttl pk-yrs)    Types: Cigarettes    Start date: 07/10/1975    Last attempt to quit: 07/09/2020    Years since quitting: 2.8   Smokeless tobacco: Never  Vaping Use   Vaping status: Every Day   Start date: 07/12/2020   Substances: Nicotine-salt  Substance and Sexual Activity   Alcohol use: No   Drug use: No   Sexual activity: Not on file  Other Topics Concern   Not on file  Social History Narrative   Not on file   Social Determinants of Health   Financial Resource Strain: Not on file  Food Insecurity: Not on file  Transportation Needs: Not on file  Physical Activity: Not on file  Stress: Not on file  Social Connections: Not on file  Intimate Partner Violence: Not on file    Family History  Problem  Relation Age of Onset   Heart attack Mother    Heart attack Father    Heart attack Sister    Heart attack Brother    Heart attack Brother    Migraines Daughter    Hyperlipidemia Daughter    Anxiety disorder Daughter     Allergies  Allergen Reactions   Morphine And Codeine Other (See Comments)    Headaches and creepy skin crawling (11/07/20 - pt currently taking without issues)   Propranolol Hives    Review of Systems  All other systems reviewed and are negative.      Objective:   BP 130/75   Pulse 80   Ht 6\' 1"  (1.854 m)   Wt 156 lb (70.8 kg)   SpO2 98%   BMI 20.58 kg/m   Vitals:   05/06/23 1532  BP: 130/75  Pulse: 80  Height: 6\' 1"  (1.854 m)  Weight: 156 lb (70.8 kg)  SpO2: 98%  BMI (Calculated): 20.59    Physical  Exam Vitals and nursing note reviewed.  Constitutional:      Appearance: Normal appearance. He is normal weight.  Eyes:     Extraocular Movements: Extraocular movements intact.     Conjunctiva/sclera: Conjunctivae normal.     Pupils: Pupils are equal, round, and reactive to light.  Cardiovascular:     Rate and Rhythm: Normal rate and regular rhythm.     Pulses: Normal pulses.     Heart sounds: Normal heart sounds.  Pulmonary:     Effort: Pulmonary effort is normal.     Breath sounds: Normal breath sounds.  Musculoskeletal:        General: Normal range of motion.  Neurological:     General: No focal deficit present.     Mental Status: He is alert and oriented to person, place, and time. Mental status is at baseline.  Psychiatric:        Mood and Affect: Mood normal.        Behavior: Behavior normal.        Thought Content: Thought content normal.        Judgment: Judgment normal.      No results found for any visits on 05/06/23.  Recent Results (from the past 2160 hour(s))  Vitamin B12     Status: Abnormal   Collection Time: 02/17/23  1:05 PM  Result Value Ref Range   Vitamin B-12 1,804 (H) 232 - 1,245 pg/mL  Hemoglobin A1c     Status: Abnormal   Collection Time: 02/17/23  1:05 PM  Result Value Ref Range   Hgb A1c MFr Bld 5.8 (H) 4.8 - 5.6 %    Comment:          Prediabetes: 5.7 - 6.4          Diabetes: >6.4          Glycemic control for adults with diabetes: <7.0    Est. average glucose Bld gHb Est-mCnc 120 mg/dL  TSH     Status: None   Collection Time: 02/17/23  1:05 PM  Result Value Ref Range   TSH 4.000 0.450 - 4.500 uIU/mL  CMP14+EGFR     Status: Abnormal   Collection Time: 02/17/23  1:05 PM  Result Value Ref Range   Glucose 103 (H) 70 - 99 mg/dL   BUN 8 8 - 27 mg/dL   Creatinine, Ser 8.11 0.76 - 1.27 mg/dL   eGFR 96 >91 YN/WGN/5.62   BUN/Creatinine Ratio 9 (L) 10 - 24  Sodium 137 134 - 144 mmol/L   Potassium 4.6 3.5 - 5.2 mmol/L   Chloride 99 96 - 106  mmol/L   CO2 24 20 - 29 mmol/L   Calcium 9.6 8.6 - 10.2 mg/dL   Total Protein 7.0 6.0 - 8.5 g/dL   Albumin 4.7 3.9 - 4.9 g/dL   Globulin, Total 2.3 1.5 - 4.5 g/dL   Albumin/Globulin Ratio 2.0 1.2 - 2.2   Bilirubin Total 0.6 0.0 - 1.2 mg/dL   Alkaline Phosphatase 58 44 - 121 IU/L   AST 21 0 - 40 IU/L   ALT 20 0 - 44 IU/L  CBC With Differential     Status: None   Collection Time: 02/17/23  1:05 PM  Result Value Ref Range   WBC 6.0 3.4 - 10.8 x10E3/uL   RBC 4.84 4.14 - 5.80 x10E6/uL   Hemoglobin 15.8 13.0 - 17.7 g/dL   Hematocrit 81.1 91.4 - 51.0 %   MCV 95 79 - 97 fL   MCH 32.6 26.6 - 33.0 pg   MCHC 34.3 31.5 - 35.7 g/dL   RDW 78.2 95.6 - 21.3 %   Neutrophils 63 Not Estab. %   Lymphs 26 Not Estab. %   Monocytes 9 Not Estab. %   Eos 1 Not Estab. %   Basos 1 Not Estab. %   Neutrophils Absolute 3.8 1.4 - 7.0 x10E3/uL   Lymphocytes Absolute 1.6 0.7 - 3.1 x10E3/uL   Monocytes Absolute 0.6 0.1 - 0.9 x10E3/uL   EOS (ABSOLUTE) 0.1 0.0 - 0.4 x10E3/uL   Basophils Absolute 0.0 0.0 - 0.2 x10E3/uL   Immature Granulocytes 0 Not Estab. %   Immature Grans (Abs) 0.0 0.0 - 0.1 x10E3/uL  VITAMIN D 25 Hydroxy (Vit-D Deficiency, Fractures)     Status: None   Collection Time: 02/17/23  1:05 PM  Result Value Ref Range   Vit D, 25-Hydroxy 38.2 30.0 - 100.0 ng/mL    Comment: Vitamin D deficiency has been defined by the Institute of Medicine and an Endocrine Society practice guideline as a level of serum 25-OH vitamin D less than 20 ng/mL (1,2). The Endocrine Society went on to further define vitamin D insufficiency as a level between 21 and 29 ng/mL (2). 1. IOM (Institute of Medicine). 2010. Dietary reference    intakes for calcium and D. [place]: The    [employer]. 2. Holick MF, Binkley [place], Bischoff-Ferrari HA, et al.    Evaluation, treatment, and prevention of vitamin D    deficiency: an Endocrine Society clinical practice    guideline. JCEM. 2011 Jul; 96(7):1911-30.    Lipid panel     Status: None   Collection Time: 02/17/23  1:05 PM  Result Value Ref Range   Cholesterol, Total 111 100 - 199 mg/dL   Triglycerides 81 0 - 149 mg/dL   HDL 42 >08 mg/dL   VLDL Cholesterol Cal 16 5 - 40 mg/dL   LDL Chol Calc (NIH) 53 0 - 99 mg/dL   Chol/HDL Ratio 2.6 0.0 - 5.0 ratio    Comment:                                   T. Chol/HDL Ratio  Men  Women                               1/2 Avg.Risk  3.4    3.3                                   Avg.Risk  5.0    4.4                                2X Avg.Risk  9.6    7.1                                3X Avg.Risk 23.4   11.0        Assessment & Plan:   Problem List Items Addressed This Visit       Active Problems   Anxiety - Primary   Essential hypertension    Blood pressure well controlled with current medications.  Continue current therapy.  Will reassess at follow up.       Hyperlipidemia    Checking labs today.  Continue current therapy for lipid control. Will modify as needed based on labwork results.        Opioid dependence (HCC)   Fatigue    Patient stable.  Well controlled with current therapy.   Continue current meds.        Prediabetes    A1C Continues to be in prediabetic ranges.  Will reassess at follow up after next lab check.  Patient counseled on dietary choices and verbalized understanding.  Patient educated on foods that contain carbohydrates and the need to decrease intake.  We discussed prediabetes, and what it means and the need for strict dietary control to prevent progression to type 2 diabetes.  Advised to decrease intake of sugary drinks, including sodas, sweet tea, and some juices, and of starch and sugar heavy foods (ie., potatoes, rice, bread, pasta, desserts). He verbalizes understanding and agreement with the changes discussed today.        Other Visit Diagnoses     B12 deficiency due to diet       Vitamin D deficiency,  unspecified       Testicular hypofunction       Relevant Orders   Testosterone,Free and Total       Return in about 3 months (around 08/06/2023).   Total time spent: 20 minutes  Miki Kins, FNP  05/06/2023   This document may have been prepared by [facility] Voice Recognition software and as such may include unintentional dictation errors.

## 2023-05-27 ENCOUNTER — Telehealth: Payer: Self-pay | Admitting: Gastroenterology

## 2023-05-27 DIAGNOSIS — M150 Primary generalized (osteo)arthritis: Secondary | ICD-10-CM | POA: Diagnosis not present

## 2023-05-27 DIAGNOSIS — Z79891 Long term (current) use of opiate analgesic: Secondary | ICD-10-CM | POA: Diagnosis not present

## 2023-05-27 DIAGNOSIS — G894 Chronic pain syndrome: Secondary | ICD-10-CM | POA: Diagnosis not present

## 2023-05-27 DIAGNOSIS — M47816 Spondylosis without myelopathy or radiculopathy, lumbar region: Secondary | ICD-10-CM | POA: Diagnosis not present

## 2023-05-27 NOTE — Telephone Encounter (Signed)
Patient wife Joel Howe) called in for her husband to get an emergency appointment he has lost a lot of weight. His wife want to speak with Dr. Servando Snare nurse.

## 2023-06-07 ENCOUNTER — Encounter: Payer: Self-pay | Admitting: Family

## 2023-06-07 NOTE — Telephone Encounter (Signed)
Left message on voicemail.

## 2023-06-17 ENCOUNTER — Other Ambulatory Visit: Payer: Self-pay | Admitting: Family

## 2023-06-17 DIAGNOSIS — R918 Other nonspecific abnormal finding of lung field: Secondary | ICD-10-CM

## 2023-06-17 DIAGNOSIS — E291 Testicular hypofunction: Secondary | ICD-10-CM

## 2023-06-17 DIAGNOSIS — I10 Essential (primary) hypertension: Secondary | ICD-10-CM

## 2023-06-17 DIAGNOSIS — E559 Vitamin D deficiency, unspecified: Secondary | ICD-10-CM

## 2023-06-17 DIAGNOSIS — R634 Abnormal weight loss: Secondary | ICD-10-CM

## 2023-06-17 DIAGNOSIS — R5381 Other malaise: Secondary | ICD-10-CM

## 2023-06-17 DIAGNOSIS — E538 Deficiency of other specified B group vitamins: Secondary | ICD-10-CM

## 2023-06-17 DIAGNOSIS — R7303 Prediabetes: Secondary | ICD-10-CM

## 2023-06-17 DIAGNOSIS — E782 Mixed hyperlipidemia: Secondary | ICD-10-CM

## 2023-06-17 DIAGNOSIS — R5383 Other fatigue: Secondary | ICD-10-CM

## 2023-06-24 ENCOUNTER — Ambulatory Visit: Payer: Medicare HMO | Admitting: Family

## 2023-06-29 ENCOUNTER — Other Ambulatory Visit: Payer: Self-pay | Admitting: Family

## 2023-06-30 ENCOUNTER — Emergency Department
Admission: EM | Admit: 2023-06-30 | Discharge: 2023-06-30 | Discharge status: Against Medical Advice | Payer: Medicare HMO

## 2023-06-30 DIAGNOSIS — R112 Nausea with vomiting, unspecified: Secondary | ICD-10-CM | POA: Diagnosis not present

## 2023-06-30 DIAGNOSIS — U071 COVID-19: Secondary | ICD-10-CM | POA: Diagnosis not present

## 2023-06-30 DIAGNOSIS — I252 Old myocardial infarction: Secondary | ICD-10-CM | POA: Diagnosis not present

## 2023-06-30 DIAGNOSIS — I10 Essential (primary) hypertension: Secondary | ICD-10-CM | POA: Diagnosis not present

## 2023-06-30 DIAGNOSIS — R197 Diarrhea, unspecified: Secondary | ICD-10-CM | POA: Diagnosis not present

## 2023-06-30 DIAGNOSIS — I491 Atrial premature depolarization: Secondary | ICD-10-CM | POA: Diagnosis not present

## 2023-06-30 DIAGNOSIS — Z951 Presence of aortocoronary bypass graft: Secondary | ICD-10-CM | POA: Diagnosis not present

## 2023-06-30 DIAGNOSIS — R42 Dizziness and giddiness: Secondary | ICD-10-CM | POA: Diagnosis not present

## 2023-06-30 DIAGNOSIS — E785 Hyperlipidemia, unspecified: Secondary | ICD-10-CM | POA: Diagnosis not present

## 2023-06-30 DIAGNOSIS — Z743 Need for continuous supervision: Secondary | ICD-10-CM | POA: Diagnosis not present

## 2023-06-30 DIAGNOSIS — I2510 Atherosclerotic heart disease of native coronary artery without angina pectoris: Secondary | ICD-10-CM | POA: Diagnosis not present

## 2023-06-30 DIAGNOSIS — R0689 Other abnormalities of breathing: Secondary | ICD-10-CM | POA: Diagnosis not present

## 2023-06-30 DIAGNOSIS — M549 Dorsalgia, unspecified: Secondary | ICD-10-CM | POA: Diagnosis not present

## 2023-06-30 DIAGNOSIS — R9431 Abnormal electrocardiogram [ECG] [EKG]: Secondary | ICD-10-CM | POA: Diagnosis not present

## 2023-06-30 DIAGNOSIS — I7143 Infrarenal abdominal aortic aneurysm, without rupture: Secondary | ICD-10-CM | POA: Diagnosis not present

## 2023-06-30 DIAGNOSIS — R109 Unspecified abdominal pain: Secondary | ICD-10-CM | POA: Diagnosis not present

## 2023-06-30 NOTE — ED Notes (Signed)
First nurse note-pt brought in via ems from home.  Pt has weakness with n/v.  Zofran 4mg  IV given by ems.    Fsbs 154, bp 198/113,pulse 76, oxygen sats 100% per ems.  pt in wheelchair in lobby.   Pt alert.

## 2023-07-01 DIAGNOSIS — R112 Nausea with vomiting, unspecified: Secondary | ICD-10-CM | POA: Diagnosis not present

## 2023-07-01 DIAGNOSIS — R197 Diarrhea, unspecified: Secondary | ICD-10-CM | POA: Diagnosis not present

## 2023-07-05 ENCOUNTER — Other Ambulatory Visit: Payer: Self-pay | Admitting: Family

## 2023-07-20 ENCOUNTER — Ambulatory Visit (INDEPENDENT_AMBULATORY_CARE_PROVIDER_SITE_OTHER): Payer: Medicare HMO | Admitting: Family

## 2023-07-20 VITALS — BP 130/79 | HR 77 | Ht 73.0 in | Wt 151.2 lb

## 2023-07-20 DIAGNOSIS — R634 Abnormal weight loss: Secondary | ICD-10-CM

## 2023-07-20 DIAGNOSIS — F1120 Opioid dependence, uncomplicated: Secondary | ICD-10-CM

## 2023-07-20 DIAGNOSIS — R5383 Other fatigue: Secondary | ICD-10-CM | POA: Diagnosis not present

## 2023-07-20 MED ORDER — ALPRAZOLAM 2 MG PO TABS: 2.0000 mg | ORAL_TABLET | Freq: Three times a day (TID) | ORAL | 0 refills | Status: DC | PRN

## 2023-07-20 MED ORDER — NALOXONE HCL 4 MG/0.1ML NA LIQD: 1.0000 | Freq: Once | NASAL | 0 refills | Status: AC

## 2023-07-20 MED ORDER — NITAZOXANIDE 500 MG PO TABS: 500.0000 mg | ORAL_TABLET | Freq: Two times a day (BID) | ORAL | 0 refills | Status: DC

## 2023-07-22 ENCOUNTER — Other Ambulatory Visit: Payer: Self-pay | Admitting: Family

## 2023-07-22 DIAGNOSIS — M150 Primary generalized (osteo)arthritis: Secondary | ICD-10-CM | POA: Diagnosis not present

## 2023-07-22 DIAGNOSIS — Z79891 Long term (current) use of opiate analgesic: Secondary | ICD-10-CM | POA: Diagnosis not present

## 2023-07-22 DIAGNOSIS — G894 Chronic pain syndrome: Secondary | ICD-10-CM | POA: Diagnosis not present

## 2023-07-22 DIAGNOSIS — M47816 Spondylosis without myelopathy or radiculopathy, lumbar region: Secondary | ICD-10-CM | POA: Diagnosis not present

## 2023-07-27 ENCOUNTER — Ambulatory Visit (INDEPENDENT_AMBULATORY_CARE_PROVIDER_SITE_OTHER): Payer: Medicare HMO

## 2023-07-27 DIAGNOSIS — G4452 New daily persistent headache (NDPH): Secondary | ICD-10-CM

## 2023-07-27 DIAGNOSIS — R634 Abnormal weight loss: Secondary | ICD-10-CM | POA: Diagnosis not present

## 2023-07-27 MED ORDER — IOHEXOL 300 MG/ML  SOLN
100.0000 mL | Freq: Once | INTRAMUSCULAR | Status: AC | PRN
  Administered 2023-07-27: 100 mL via INTRAVENOUS

## 2023-08-07 ENCOUNTER — Other Ambulatory Visit: Payer: Self-pay | Admitting: Family

## 2023-08-09 ENCOUNTER — Ambulatory Visit: Payer: Medicare HMO | Admitting: Family

## 2023-08-20 ENCOUNTER — Encounter: Payer: Self-pay | Admitting: Family

## 2023-08-20 ENCOUNTER — Ambulatory Visit (INDEPENDENT_AMBULATORY_CARE_PROVIDER_SITE_OTHER): Payer: Medicare HMO | Admitting: Family

## 2023-08-20 VITALS — BP 150/90 | HR 75 | Ht 73.0 in | Wt 159.0 lb

## 2023-08-20 DIAGNOSIS — F1120 Opioid dependence, uncomplicated: Secondary | ICD-10-CM | POA: Diagnosis not present

## 2023-08-20 DIAGNOSIS — I10 Essential (primary) hypertension: Secondary | ICD-10-CM

## 2023-08-20 DIAGNOSIS — R7303 Prediabetes: Secondary | ICD-10-CM | POA: Diagnosis not present

## 2023-08-20 DIAGNOSIS — E782 Mixed hyperlipidemia: Secondary | ICD-10-CM | POA: Diagnosis not present

## 2023-08-21 NOTE — Assessment & Plan Note (Signed)
Patient is seen by pain clinic, who manage this condition.  He is well controlled with current therapy.   Will defer to them for further changes to plan of care.

## 2023-08-21 NOTE — Assessment & Plan Note (Signed)
A1C Continues to be in prediabetic ranges.  Will reassess at follow up after next lab check.  Patient counseled on dietary choices and verbalized understanding.  Patient educated on foods that contain carbohydrates and the need to decrease intake.  We discussed prediabetes, and what it means and the need for strict dietary control to prevent progression to type 2 diabetes.  Advised to decrease intake of sugary drinks, including sodas, sweet tea, and some juices, and of starch and sugar heavy foods (ie., potatoes, rice, bread, pasta, desserts). He verbalizes understanding and agreement with the changes discussed today.  

## 2023-08-21 NOTE — Progress Notes (Signed)
Established Patient Office Visit  Subjective:  [name], male    DOB: 02-07-1956  Age: 67 y.o. MRN: [id number]  Chief Complaint  Patient presents with   Follow-up    3 month follow up    Patient is here today for his 3 months follow up.  He has been feeling well since last appointment.   He does not have additional concerns to discuss today.  He is feeling better than he has in a very long time.  He says that as soon as he finished the medication we sent previously, he started to feel better almost immediately.  He has gained 8 pounds since his previous appointment with me last month.  He did not ever come to get his labs drawn.   Labs are due today. He needs refills.   I have reviewed his active problem list, medication list, allergies, notes from last encounter, lab results for his appointment today.      No other concerns at this time.   Past Medical History:  Diagnosis Date   Anginal pain (HCC)    Anxiety    Arthritis    Chronic back pain    Coronary artery disease    CABG in 2007 at [facility] with LIMA to LAD, SVG to OM and SVG to right PDA   Hyperlipidemia    Hypertension    Lyme disease    Multilevel degenerative disc disease    Myocardial infarction (HCC) 12/2005   Tobacco use     Past Surgical History:  Procedure Laterality Date   CARDIAC CATHETERIZATION N/A 05/07/2015   Procedure: Left Heart Cath;  Surgeon: Laurier Nancy, MD;  Location: [facility];  Service: Cardiovascular;  Laterality: N/A;   COLONOSCOPY WITH PROPOFOL N/A 11/18/2020   Procedure: COLONOSCOPY WITH BIOPSY;  Surgeon: Midge Minium, MD;  Location: [facility];  Service: Endoscopy;  Laterality: N/A;   CORONARY ANGIOPLASTY     CORONARY ARTERY BYPASS GRAFT  02/2006   4 vessel.  [facility]   ESOPHAGOGASTRODUODENOSCOPY (EGD) WITH PROPOFOL N/A 12/04/2022   Procedure: ESOPHAGOGASTRODUODENOSCOPY (EGD) WITH PROPOFOL;  Surgeon: Midge Minium, MD;  Location: [facility];   Service: Endoscopy;  Laterality: N/A;   POLYPECTOMY N/A 11/18/2020   Procedure: POLYPECTOMY;  Surgeon: Midge Minium, MD;  Location: [facility];  Service: Endoscopy;  Laterality: N/A;   VISCERAL ANGIOGRAPHY N/A 01/21/2023   Procedure: VISCERAL ANGIOGRAPHY;  Surgeon: Annice Needy, MD;  Location: [facility];  Service: Cardiovascular;  Laterality: N/A;    Social History   Socioeconomic History   Marital status: Married    Spouse name: Not on file   Number of children: Not on file   Years of education: Not on file   Highest education level: Not on file  Occupational History   Not on file  Tobacco Use   Smoking status: Every Day    Current packs/day: 0.00    Average packs/day: 0.5 packs/day for 45.0 years (22.5 ttl pk-yrs)    Types: Cigarettes    Start date: 07/10/1975    Last attempt to quit: 07/09/2020    Years since quitting: 3.1   Smokeless tobacco: Never  Vaping Use   Vaping status: Every Day   Start date: 07/12/2020   Substances: Nicotine-salt  Substance and Sexual Activity   Alcohol use: No   Drug use: No   Sexual activity: Not on file  Other Topics Concern   Not on file  Social History  Narrative   Not on file   Social Determinants of Health   Financial Resource Strain: Not on file  Food Insecurity: Not on file  Transportation Needs: Not on file  Physical Activity: Not on file  Stress: Not on file  Social Connections: Not on file  Intimate Partner Violence: Not on file    Family History  Problem Relation Age of Onset   Heart attack Mother    Heart attack Father    Heart attack Sister    Heart attack Brother    Heart attack Brother    Migraines Daughter    Hyperlipidemia Daughter    Anxiety disorder Daughter     Allergies  Allergen Reactions   Morphine And Codeine Other (See Comments)    Headaches and creepy skin crawling (11/07/20 - pt currently taking without issues)   Propranolol Hives    Review of Systems  All other systems  reviewed and are negative.      Objective:   BP (!) 150/90   Pulse 75   Ht 6\' 1"  (1.854 m)   Wt 159 lb (72.1 kg)   SpO2 92%   BMI 20.98 kg/m   Vitals:   08/20/23 1436  BP: (!) 150/90  Pulse: 75  Height: 6\' 1"  (1.854 m)  Weight: 159 lb (72.1 kg)  SpO2: 92%  BMI (Calculated): 20.98    Physical Exam Vitals and nursing note reviewed.  Constitutional:      Appearance: Normal appearance. He is normal weight.  Eyes:     Extraocular Movements: Extraocular movements intact.     Conjunctiva/sclera: Conjunctivae normal.     Pupils: Pupils are equal, round, and reactive to light.  Cardiovascular:     Rate and Rhythm: Normal rate and regular rhythm.     Pulses: Normal pulses.     Heart sounds: Normal heart sounds.  Pulmonary:     Effort: Pulmonary effort is normal.     Breath sounds: Normal breath sounds.  Neurological:     General: No focal deficit present.     Mental Status: He is alert and oriented to person, place, and time. Mental status is at baseline.  Psychiatric:        Mood and Affect: Mood normal.        Behavior: Behavior normal.        Thought Content: Thought content normal.        Judgment: Judgment normal.      No results found for any visits on 08/20/23.  No results found for this or any previous visit (from the past 2160 hour(s)).     Assessment & Plan:   Problem List Items Addressed This Visit       Active Problems   Essential hypertension - Primary    Blood pressure well controlled with current medications.  Continue current therapy.  Will reassess at follow up.        Hyperlipidemia    Entering labs today Patient will come in next week to have drawn fasting.   Will adjust as needed based on results.       Opioid dependence (HCC)    Patient is seen by pain clinic, who manage this condition.  He is well controlled with current therapy.   Will defer to them for further changes to plan of care.       Prediabetes    A1C Continues  to be in prediabetic ranges.  Will reassess at follow up after next lab check.  Patient counseled  on dietary choices and verbalized understanding.  Patient educated on foods that contain carbohydrates and the need to decrease intake.  We discussed prediabetes, and what it means and the need for strict dietary control to prevent progression to type 2 diabetes.  Advised to decrease intake of sugary drinks, including sodas, sweet tea, and some juices, and of starch and sugar heavy foods (ie., potatoes, rice, bread, pasta, desserts). He verbalizes understanding and agreement with the changes discussed today.         Return to be determined based on lab results.   Total time spent: 20 minutes  Miki Kins, FNP  08/20/2023   This document may have been prepared by [facility] Voice Recognition software and as such may include unintentional dictation errors.

## 2023-08-21 NOTE — Assessment & Plan Note (Signed)
Entering labs today Patient will come in next week to have drawn fasting.   Will adjust as needed based on results.

## 2023-08-21 NOTE — Assessment & Plan Note (Signed)
Blood pressure well controlled with current medications.  Continue current therapy.  Will reassess at follow up.  

## 2023-09-04 ENCOUNTER — Encounter: Payer: Self-pay | Admitting: Family

## 2023-09-04 NOTE — Assessment & Plan Note (Signed)
Will treat the possible tapeworm for pt to see if he can begin to get back to normal.   He will also come in to have labs checked.  Recheck at follow up.

## 2023-09-04 NOTE — Assessment & Plan Note (Signed)
Will send rx for patient to treat tapeworm.  Will recheck at follow up.  Patient will come in to have his labs drawn fasting.

## 2023-09-04 NOTE — Progress Notes (Signed)
Established Patient Office Visit  Subjective:  [name], male    DOB: Mar 22, 1956  Age: 67 y.o. MRN: [id number]  Chief Complaint  Patient presents with   Follow-up    Weight loss,depression/anxiety    Patient is here today for follow up.  He is doing well since his last appointment, but has had several recurrences of the symptoms he was complaining about at that time, specifically the fatigue, weakness, and loss of appetite.  He does have a new idea, says that he is concerned he might have a tapeworm.  He did have an episode of eating very undercooked beef prior to this starting.  He also says that he saw something in the toilet that looked like one, and he had them as a child so he "remembers what they look like".   No other concerns today.     No other concerns at this time.   Past Medical History:  Diagnosis Date   Anginal pain (HCC)    Anxiety    Arthritis    Chronic back pain    Coronary artery disease    CABG in 2007 at [facility] with LIMA to LAD, SVG to OM and SVG to right PDA   Hyperlipidemia    Hypertension    Lyme disease    Multilevel degenerative disc disease    Myocardial infarction (HCC) 12/2005   Tobacco use     Past Surgical History:  Procedure Laterality Date   CARDIAC CATHETERIZATION N/A 05/07/2015   Procedure: Left Heart Cath;  Surgeon: Laurier Nancy, MD;  Location: [facility];  Service: Cardiovascular;  Laterality: N/A;   COLONOSCOPY WITH PROPOFOL N/A 11/18/2020   Procedure: COLONOSCOPY WITH BIOPSY;  Surgeon: Midge Minium, MD;  Location: [facility];  Service: Endoscopy;  Laterality: N/A;   CORONARY ANGIOPLASTY     CORONARY ARTERY BYPASS GRAFT  02/2006   4 vessel.  [facility]   ESOPHAGOGASTRODUODENOSCOPY (EGD) WITH PROPOFOL N/A 12/04/2022   Procedure: ESOPHAGOGASTRODUODENOSCOPY (EGD) WITH PROPOFOL;  Surgeon: Midge Minium, MD;  Location: [facility];  Service: Endoscopy;  Laterality: N/A;   POLYPECTOMY N/A 11/18/2020    Procedure: POLYPECTOMY;  Surgeon: Midge Minium, MD;  Location: [facility];  Service: Endoscopy;  Laterality: N/A;   VISCERAL ANGIOGRAPHY N/A 01/21/2023   Procedure: VISCERAL ANGIOGRAPHY;  Surgeon: Annice Needy, MD;  Location: [facility];  Service: Cardiovascular;  Laterality: N/A;    Social History   Socioeconomic History   Marital status: Married    Spouse name: Not on file   Number of children: Not on file   Years of education: Not on file   Highest education level: Not on file  Occupational History   Not on file  Tobacco Use   Smoking status: Every Day    Current packs/day: 0.00    Average packs/day: 0.5 packs/day for 45.0 years (22.5 ttl pk-yrs)    Types: Cigarettes    Start date: 07/10/1975    Last attempt to quit: 07/09/2020    Years since quitting: 3.1   Smokeless tobacco: Never  Vaping Use   Vaping status: Every Day   Start date: 07/12/2020   Substances: Nicotine-salt  Substance and Sexual Activity   Alcohol use: No   Drug use: No   Sexual activity: Not on file  Other Topics Concern   Not on file  Social History Narrative   Not on file   Social Determinants of Health   Financial Resource Strain:  Not on file  Food Insecurity: Not on file  Transportation Needs: Not on file  Physical Activity: Not on file  Stress: Not on file  Social Connections: Not on file  Intimate Partner Violence: Not on file    Family History  Problem Relation Age of Onset   Heart attack Mother    Heart attack Father    Heart attack Sister    Heart attack Brother    Heart attack Brother    Migraines Daughter    Hyperlipidemia Daughter    Anxiety disorder Daughter     Allergies  Allergen Reactions   Morphine And Codeine Other (See Comments)    Headaches and creepy skin crawling (11/07/20 - pt currently taking without issues)   Propranolol Hives    Review of Systems  Constitutional:  Positive for malaise/fatigue and weight loss.  Gastrointestinal:         Loss of appetite Abnormal stools  All other systems reviewed and are negative.      Objective:   BP 130/79   Pulse 77   Ht 6\' 1"  (1.854 m)   Wt 151 lb 3.2 oz (68.6 kg)   SpO2 95%   BMI 19.95 kg/m   Vitals:   07/20/23 1533  BP: 130/79  Pulse: 77  Height: 6\' 1"  (1.854 m)  Weight: 151 lb 3.2 oz (68.6 kg)  SpO2: 95%  BMI (Calculated): 19.95    Physical Exam Vitals and nursing note reviewed.  Constitutional:      General: He is not in acute distress.    Appearance: Normal appearance. He is normal weight. He is ill-appearing. He is not toxic-appearing or diaphoretic.  Eyes:     Pupils: Pupils are equal, round, and reactive to light.  Cardiovascular:     Rate and Rhythm: Normal rate and regular rhythm.     Pulses: Normal pulses.     Heart sounds: Normal heart sounds.  Pulmonary:     Effort: Pulmonary effort is normal.     Breath sounds: Normal breath sounds.  Neurological:     General: No focal deficit present.     Mental Status: He is alert and oriented to person, place, and time. Mental status is at baseline.     Motor: Weakness present.  Psychiatric:        Mood and Affect: Mood normal.        Behavior: Behavior normal.        Thought Content: Thought content normal.        Judgment: Judgment normal.      No results found for any visits on 07/20/23.  No results found for this or any previous visit (from the past 2160 hour(s)).     Assessment & Plan:   Problem List Items Addressed This Visit       Active Problems   Opioid dependence (HCC)    Patient is seen by Pain Management, who manage this condition.  He is well controlled with current therapy.   Will defer to them for further changes to plan of care.       Loss of weight - Primary    Will treat the possible tapeworm for pt to see if he can begin to get back to normal.   He will also come in to have labs checked.  Recheck at follow up.        Fatigue    Will send rx for patient to treat  tapeworm.  Will recheck at follow up.  Patient  will come in to have his labs drawn fasting.        Return in about 1 month (around 08/20/2023) for F/U.   Total time spent: 20 minutes  Miki Kins, FNP  07/20/2023   This document may have been prepared by [facility] Voice Recognition software and as such may include unintentional dictation errors.

## 2023-09-04 NOTE — Assessment & Plan Note (Signed)
Patient is seen by Pain Management, who manage this condition.  He is well controlled with current therapy.   Will defer to them for further changes to plan of care.

## 2023-09-07 ENCOUNTER — Ambulatory Visit: Payer: Medicare HMO | Admitting: Cardiovascular Disease

## 2023-09-13 ENCOUNTER — Other Ambulatory Visit: Payer: Medicare HMO

## 2023-09-13 DIAGNOSIS — E559 Vitamin D deficiency, unspecified: Secondary | ICD-10-CM | POA: Diagnosis not present

## 2023-09-13 DIAGNOSIS — R918 Other nonspecific abnormal finding of lung field: Secondary | ICD-10-CM | POA: Diagnosis not present

## 2023-09-13 DIAGNOSIS — R5381 Other malaise: Secondary | ICD-10-CM | POA: Diagnosis not present

## 2023-09-13 DIAGNOSIS — R7303 Prediabetes: Secondary | ICD-10-CM | POA: Diagnosis not present

## 2023-09-13 DIAGNOSIS — E538 Deficiency of other specified B group vitamins: Secondary | ICD-10-CM | POA: Diagnosis not present

## 2023-09-13 DIAGNOSIS — I10 Essential (primary) hypertension: Secondary | ICD-10-CM | POA: Diagnosis not present

## 2023-09-13 DIAGNOSIS — E291 Testicular hypofunction: Secondary | ICD-10-CM | POA: Diagnosis not present

## 2023-09-13 DIAGNOSIS — R5383 Other fatigue: Secondary | ICD-10-CM | POA: Diagnosis not present

## 2023-09-13 DIAGNOSIS — R634 Abnormal weight loss: Secondary | ICD-10-CM | POA: Diagnosis not present

## 2023-09-13 DIAGNOSIS — E782 Mixed hyperlipidemia: Secondary | ICD-10-CM | POA: Diagnosis not present

## 2023-09-14 ENCOUNTER — Ambulatory Visit (INDEPENDENT_AMBULATORY_CARE_PROVIDER_SITE_OTHER): Payer: Medicare HMO | Admitting: Family

## 2023-09-14 ENCOUNTER — Encounter: Payer: Self-pay | Admitting: Family

## 2023-09-14 VITALS — BP 100/70 | HR 97 | Ht 73.0 in | Wt 154.6 lb

## 2023-09-14 DIAGNOSIS — Z0130 Encounter for examination of blood pressure without abnormal findings: Secondary | ICD-10-CM

## 2023-09-14 DIAGNOSIS — F331 Major depressive disorder, recurrent, moderate: Secondary | ICD-10-CM | POA: Diagnosis not present

## 2023-09-14 DIAGNOSIS — F419 Anxiety disorder, unspecified: Secondary | ICD-10-CM

## 2023-09-14 LAB — CMP14+EGFR
ALT: 41 [IU]/L (ref 0–44)
AST: 29 [IU]/L (ref 0–40)
Albumin: 4.2 g/dL (ref 3.9–4.9)
Alkaline Phosphatase: 63 [IU]/L (ref 44–121)
BUN/Creatinine Ratio: 12 (ref 10–24)
BUN: 9 mg/dL (ref 8–27)
Bilirubin Total: 0.6 mg/dL (ref 0.0–1.2)
CO2: 25 mmol/L (ref 20–29)
Calcium: 9.1 mg/dL (ref 8.6–10.2)
Chloride: 100 mmol/L (ref 96–106)
Creatinine, Ser: 0.78 mg/dL (ref 0.76–1.27)
Globulin, Total: 2.2 g/dL (ref 1.5–4.5)
Glucose: 94 mg/dL (ref 70–99)
Potassium: 4.3 mmol/L (ref 3.5–5.2)
Sodium: 137 mmol/L (ref 134–144)
Total Protein: 6.4 g/dL (ref 6.0–8.5)
eGFR: 99 mL/min/{1.73_m2} (ref >59)

## 2023-09-14 LAB — HEMOGLOBIN A1C
Est. average glucose Bld gHb Est-mCnc: 111 mg/dL
Hgb A1c MFr Bld: 5.5 % (ref 4.8–5.6)

## 2023-09-14 LAB — VITAMIN B12: Vitamin B-12: 907 pg/mL (ref 232–1245)

## 2023-09-14 LAB — LIPID PANEL
Chol/HDL Ratio: 2.4 {ratio} (ref 0.0–5.0)
Cholesterol, Total: 120 mg/dL (ref 100–199)
HDL: 49 mg/dL (ref >39)
LDL Chol Calc (NIH): 56 mg/dL (ref 0–99)
Triglycerides: 75 mg/dL (ref 0–149)
VLDL Cholesterol Cal: 15 mg/dL (ref 5–40)

## 2023-09-14 LAB — TSH: TSH: 2.92 u[IU]/mL (ref 0.450–4.500)

## 2023-09-14 LAB — ACTH: ACTH: 23.5 pg/mL (ref 7.2–63.3)

## 2023-09-14 LAB — CORTISOL: Cortisol: 5.1 ug/dL — ABNORMAL LOW (ref 6.2–19.4)

## 2023-09-14 LAB — VITAMIN D 25 HYDROXY (VIT D DEFICIENCY, FRACTURES): Vit D, 25-Hydroxy: 26.5 ng/mL — ABNORMAL LOW (ref 30.0–100.0)

## 2023-09-14 MED ORDER — ALPRAZOLAM 2 MG PO TABS: 2.0000 mg | ORAL_TABLET | Freq: Three times a day (TID) | ORAL | 0 refills | Status: DC | PRN

## 2023-09-14 MED ORDER — XYOSTED 75 MG/0.5ML ~~LOC~~ SOAJ: 75.0000 mg | SUBCUTANEOUS | 1 refills | Status: DC

## 2023-09-16 DIAGNOSIS — M150 Primary generalized (osteo)arthritis: Secondary | ICD-10-CM | POA: Diagnosis not present

## 2023-09-16 DIAGNOSIS — G894 Chronic pain syndrome: Secondary | ICD-10-CM | POA: Diagnosis not present

## 2023-09-16 DIAGNOSIS — M47816 Spondylosis without myelopathy or radiculopathy, lumbar region: Secondary | ICD-10-CM | POA: Diagnosis not present

## 2023-09-16 DIAGNOSIS — Z79891 Long term (current) use of opiate analgesic: Secondary | ICD-10-CM | POA: Diagnosis not present

## 2023-09-29 ENCOUNTER — Ambulatory Visit
Admission: RE | Admit: 2023-09-29 | Discharge: 2023-09-29 | Disposition: A | Discharge status: Home / Self Care | Payer: Medicare HMO | Source: Ambulatory Visit | Attending: Family | Admitting: Family

## 2023-09-29 ENCOUNTER — Ambulatory Visit (INDEPENDENT_AMBULATORY_CARE_PROVIDER_SITE_OTHER): Payer: Medicare HMO | Admitting: Family

## 2023-09-29 ENCOUNTER — Encounter: Payer: Self-pay | Admitting: Family

## 2023-09-29 VITALS — BP 148/80 | HR 53 | Ht 73.0 in | Wt 155.0 lb

## 2023-09-29 DIAGNOSIS — R0602 Shortness of breath: Secondary | ICD-10-CM | POA: Diagnosis not present

## 2023-09-29 DIAGNOSIS — J069 Acute upper respiratory infection, unspecified: Secondary | ICD-10-CM

## 2023-09-29 DIAGNOSIS — R0609 Other forms of dyspnea: Secondary | ICD-10-CM | POA: Insufficient documentation

## 2023-09-29 LAB — POCT XPERT XPRESS SARS COVID-2/FLU/RSV
FLU A: NEGATIVE
FLU B: NEGATIVE
RSV RNA, PCR: NEGATIVE
SARS Coronavirus 2: NEGATIVE

## 2023-09-29 MED ORDER — ALBUTEROL SULFATE HFA 108 (90 BASE) MCG/ACT IN AERS: 2.0000 | INHALATION_SPRAY | Freq: Once | RESPIRATORY_TRACT | Status: DC

## 2023-09-29 MED ORDER — ALBUTEROL SULFATE HFA 108 (90 BASE) MCG/ACT IN AERS: 2.0000 | INHALATION_SPRAY | Freq: Four times a day (QID) | RESPIRATORY_TRACT | 2 refills | Status: AC | PRN

## 2023-09-29 MED ORDER — AMOXICILLIN-POT CLAVULANATE 875-125 MG PO TABS: 1.0000 | ORAL_TABLET | Freq: Two times a day (BID) | ORAL | 0 refills | Status: DC

## 2023-09-29 NOTE — Progress Notes (Signed)
    Subjective:     Joel Howe is a 67 y.o. male who presents for evaluation of symptoms of a URI. Symptoms include congestion, facial pain, nasal congestion, and ear pain and fullness . Onset of symptoms was 7 days ago, and has been gradually worsening since that time. Treatment to date: antihistamines, cough suppressants, decongestants, and nasal steroids.  The following portions of the patient's history were reviewed and updated as appropriate: allergies, current medications, past family history, past medical history, past social history, past surgical history, and problem list.  Review of Systems Pertinent items noted in HPI and remainder of comprehensive ROS otherwise negative.   Objective:     Head: Normocephalic, without obvious abnormality, atraumatic, sinuses tender to percussion Eyes: conjunctivae/corneas clear. PERRL, EOM's intact. Fundi benign. Ears: abnormal TM right ear - bulging and serous middle ear fluid and abnormal TM left ear - serous middle ear fluid Nose: Nares normal. Septum midline. Mucosa normal. No drainage or sinus tenderness., blood tinged and yellow discharge, severe congestion, sinus tenderness bilateral Throat: lips, mucosa, and tongue normal; teeth and gums normal Lungs: diminished breath sounds LLL and wheezes bilaterally    Assessment:    viral upper respiratory illness   Plan:     Discussed diagnosis and treatment of URI. Nasal saline spray for congestion. Follow up as needed. Call in 7 days if symptoms aren't resolving. Xray ordered today.  COVID/Flu/RSV all negative in office today.  Will send antibiotics.     Total time spent: 20 minutes   Miki Kins, FNP 09/29/23

## 2023-09-30 NOTE — Progress Notes (Signed)
Patient notified

## 2023-10-18 ENCOUNTER — Other Ambulatory Visit: Payer: Self-pay | Admitting: Family

## 2023-10-19 MED ORDER — ALPRAZOLAM 2 MG PO TABS: 2.0000 mg | ORAL_TABLET | Freq: Three times a day (TID) | ORAL | 0 refills | Status: DC | PRN

## 2023-11-12 DIAGNOSIS — Z79891 Long term (current) use of opiate analgesic: Secondary | ICD-10-CM | POA: Diagnosis not present

## 2023-11-12 DIAGNOSIS — G894 Chronic pain syndrome: Secondary | ICD-10-CM | POA: Diagnosis not present

## 2023-11-12 DIAGNOSIS — M47816 Spondylosis without myelopathy or radiculopathy, lumbar region: Secondary | ICD-10-CM | POA: Diagnosis not present

## 2023-11-12 DIAGNOSIS — M150 Primary generalized (osteo)arthritis: Secondary | ICD-10-CM | POA: Diagnosis not present

## 2023-11-17 ENCOUNTER — Other Ambulatory Visit: Payer: Self-pay | Admitting: Family

## 2023-11-20 MED ORDER — ALPRAZOLAM 2 MG PO TABS: 2.0000 mg | ORAL_TABLET | Freq: Three times a day (TID) | ORAL | 0 refills | Status: DC | PRN

## 2023-11-20 MED ORDER — TAMSULOSIN HCL 0.4 MG PO CAPS: 0.4000 mg | ORAL_CAPSULE | Freq: Every day | ORAL | 1 refills | Status: DC

## 2023-11-23 ENCOUNTER — Ambulatory Visit: Payer: Medicare HMO | Admitting: Cardiovascular Disease

## 2023-12-12 ENCOUNTER — Encounter: Payer: Self-pay | Admitting: Family

## 2023-12-12 NOTE — Progress Notes (Signed)
 Established Patient Office Visit  Subjective:  [name], male    DOB: 22-Oct-1955  Age: 68 y.o. MRN: [id number]  Chief Complaint  Patient presents with   Depression    depression    Depression        This is a recurrent problem.  The current episode started more than 1 month ago.   The onset quality is gradual.   The problem occurs constantly.  The problem has been waxing and waning since onset.  Associated symptoms include decreased concentration, fatigue and insomnia.     Exacerbated by: medical conditions.  Past treatments include SSRIs - Selective serotonin reuptake inhibitors and other medications.  Compliance with treatment is variable.  Past compliance problems include medical issues.  Previous treatment provided no relief relief.  Risk factors include a recent illness and history of mental illness.   Past medical history includes chronic illness, recent illness, physical disability and anxiety.     No other concerns at this time.   Past Medical History:  Diagnosis Date   Anginal pain (HCC)    Anxiety    Arthritis    Chronic back pain    Coronary artery disease    CABG in 2007 at [facility] with LIMA to LAD, SVG to OM and SVG to right PDA   Hyperlipidemia    Hypertension    Lyme disease    Multilevel degenerative disc disease    Myocardial infarction (HCC) 12/2005   Tobacco use     Past Surgical History:  Procedure Laterality Date   CARDIAC CATHETERIZATION N/A 05/07/2015   Procedure: Left Heart Cath;  Surgeon: Laurier Nancy, MD;  Location: [facility];  Service: Cardiovascular;  Laterality: N/A;   COLONOSCOPY WITH PROPOFOL N/A 11/18/2020   Procedure: COLONOSCOPY WITH BIOPSY;  Surgeon: Midge Minium, MD;  Location: [facility];  Service: Endoscopy;  Laterality: N/A;   CORONARY ANGIOPLASTY     CORONARY ARTERY BYPASS GRAFT  02/2006   4 vessel.  [facility]   ESOPHAGOGASTRODUODENOSCOPY (EGD) WITH PROPOFOL N/A 12/04/2022   Procedure:  ESOPHAGOGASTRODUODENOSCOPY (EGD) WITH PROPOFOL;  Surgeon: Midge Minium, MD;  Location: [facility];  Service: Endoscopy;  Laterality: N/A;   POLYPECTOMY N/A 11/18/2020   Procedure: POLYPECTOMY;  Surgeon: Midge Minium, MD;  Location: [facility];  Service: Endoscopy;  Laterality: N/A;   VISCERAL ANGIOGRAPHY N/A 01/21/2023   Procedure: VISCERAL ANGIOGRAPHY;  Surgeon: Annice Needy, MD;  Location: [facility];  Service: Cardiovascular;  Laterality: N/A;    Social History   Socioeconomic History   Marital status: Married    Spouse name: Not on file   Number of children: Not on file   Years of education: Not on file   Highest education level: Not on file  Occupational History   Not on file  Tobacco Use   Smoking status: Every Day    Current packs/day: 0.00    Average packs/day: 0.5 packs/day for 45.0 years (22.5 ttl pk-yrs)    Types: Cigarettes    Start date: 07/10/1975    Last attempt to quit: 07/09/2020    Years since quitting: 3.4   Smokeless tobacco: Never  Vaping Use   Vaping status: Every Day   Start date: 07/12/2020   Substances: Nicotine-salt  Substance and Sexual Activity   Alcohol use: No   Drug use: No   Sexual activity: Not on file  Other Topics Concern   Not on file  Social History Narrative  Not on file   Social Drivers of Health   Financial Resource Strain: Not on file  Food Insecurity: Not on file  Transportation Needs: Not on file  Physical Activity: Not on file  Stress: Not on file  Social Connections: Not on file  Intimate Partner Violence: Not on file    Family History  Problem Relation Age of Onset   Heart attack Mother    Heart attack Father    Heart attack Sister    Heart attack Brother    Heart attack Brother    Migraines Daughter    Hyperlipidemia Daughter    Anxiety disorder Daughter     Allergies  Allergen Reactions   Morphine And Codeine Other (See Comments)    Headaches and creepy skin crawling (11/07/20 - pt  currently taking without issues)   Propranolol Hives    Review of Systems  Constitutional:  Positive for fatigue.  Psychiatric/Behavioral:  Positive for decreased concentration and depression. The patient has insomnia.   All other systems reviewed and are negative.      Objective:   BP 100/70   Pulse 97   Ht 6\' 1"  (1.854 m)   Wt 154 lb 9.6 oz (70.1 kg)   SpO2 97%   BMI 20.40 kg/m   Vitals:   09/14/23 1521  BP: 100/70  Pulse: 97  Height: 6\' 1"  (1.854 m)  Weight: 154 lb 9.6 oz (70.1 kg)  SpO2: 97%  BMI (Calculated): 20.4    Physical Exam Vitals and nursing note reviewed.  Constitutional:      General: He is not in acute distress.    Appearance: Normal appearance. He is normal weight. He is ill-appearing. He is not toxic-appearing.  Eyes:     Pupils: Pupils are equal, round, and reactive to light.  Cardiovascular:     Rate and Rhythm: Normal rate and regular rhythm.     Pulses: Normal pulses.     Heart sounds: Normal heart sounds.  Pulmonary:     Effort: Pulmonary effort is normal.     Breath sounds: Normal breath sounds.  Neurological:     Mental Status: He is alert.     Motor: Weakness present.  Psychiatric:        Attention and Perception: Attention and perception normal.        Mood and Affect: Mood is anxious and depressed. Affect is flat.        Speech: Speech normal.        Behavior: Behavior normal. Behavior is cooperative.        Thought Content: Thought content normal.        Judgment: Judgment normal.      No results found for any visits on 09/14/23.  Recent Results (from the past 2160 hours)  Lipid panel     Status: None   Collection Time: 09/13/23  1:49 PM  Result Value Ref Range   Cholesterol, Total 120 100 - 199 mg/dL   Triglycerides 75 0 - 149 mg/dL   HDL 49 >40 mg/dL   VLDL Cholesterol Cal 15 5 - 40 mg/dL   LDL Chol Calc (NIH) 56 0 - 99 mg/dL   Chol/HDL Ratio 2.4 0.0 - 5.0 ratio    Comment:                                   T.  Chol/HDL Ratio  Men  Women                               1/2 Avg.Risk  3.4    3.3                                   Avg.Risk  5.0    4.4                                2X Avg.Risk  9.6    7.1                                3X Avg.Risk 23.4   11.0   VITAMIN D 25 Hydroxy (Vit-D Deficiency, Fractures)     Status: Abnormal   Collection Time: 09/13/23  1:49 PM  Result Value Ref Range   Vit D, 25-Hydroxy 26.5 (L) 30.0 - 100.0 ng/mL    Comment: Vitamin D deficiency has been defined by the Institute of Medicine and an Endocrine Society practice guideline as a level of serum 25-OH vitamin D less than 20 ng/mL (1,2). The Endocrine Society went on to further define vitamin D insufficiency as a level between 21 and 29 ng/mL (2). 1. IOM (Institute of Medicine). 2010. Dietary reference    intakes for calcium and D. [place]: The    [employer]. 2. Holick MF, Binkley McCune, Bischoff-Ferrari HA, et al.    Evaluation, treatment, and prevention of vitamin D    deficiency: an Endocrine Society clinical practice    guideline. JCEM. 2011 Jul; 96(7):1911-30.   CMP14+EGFR     Status: None   Collection Time: 09/13/23  1:49 PM  Result Value Ref Range   Glucose 94 70 - 99 mg/dL   BUN 9 8 - 27 mg/dL   Creatinine, Ser 6.96 0.76 - 1.27 mg/dL   eGFR 99 >29 BM/WUX/3.24   BUN/Creatinine Ratio 12 10 - 24   Sodium 137 134 - 144 mmol/L   Potassium 4.3 3.5 - 5.2 mmol/L   Chloride 100 96 - 106 mmol/L   CO2 25 20 - 29 mmol/L   Calcium 9.1 8.6 - 10.2 mg/dL   Total Protein 6.4 6.0 - 8.5 g/dL   Albumin 4.2 3.9 - 4.9 g/dL   Globulin, Total 2.2 1.5 - 4.5 g/dL   Bilirubin Total 0.6 0.0 - 1.2 mg/dL   Alkaline Phosphatase 63 44 - 121 IU/L   AST 29 0 - 40 IU/L   ALT 41 0 - 44 IU/L  TSH     Status: None   Collection Time: 09/13/23  1:49 PM  Result Value Ref Range   TSH 2.920 0.450 - 4.500 uIU/mL  Hemoglobin A1c     Status: None   Collection Time: 09/13/23   1:49 PM  Result Value Ref Range   Hgb A1c MFr Bld 5.5 4.8 - 5.6 %    Comment:          Prediabetes: 5.7 - 6.4          Diabetes: >6.4          Glycemic control for adults with diabetes: <7.0    Est. average glucose Bld gHb Est-mCnc 111 mg/dL  Vitamin M01     Status: None  Collection Time: 09/13/23  1:49 PM  Result Value Ref Range   Vitamin B-12 907 232 - 1,245 pg/mL  ACTH     Status: None   Collection Time: 09/13/23  1:49 PM  Result Value Ref Range   ACTH 23.5 7.2 - 63.3 pg/mL    Comment: ACTH reference interval for samples collected between 7 and 10 AM.  Cortisol     Status: Abnormal   Collection Time: 09/13/23  1:49 PM  Result Value Ref Range   Cortisol 5.1 (L) 6.2 - 19.4 ug/dL    Comment: Please Note: The reference interval and flagging for  this test is for an AM collection. If this is a PM  collection please use:         Cortisol PM: 2.3-11.9   POCT XPERT XPRESS SARS COVID-2/FLU/RSV     Status: None   Collection Time: 09/29/23  2:16 PM  Result Value Ref Range   SARS Coronavirus 2 neg    FLU A neg    FLU B neg    RSV RNA, PCR neg        Assessment & Plan:   Problem List Items Addressed This Visit       Other   Anxiety - Primary   Other Visit Diagnoses       Moderate episode of recurrent major depressive disorder (HCC)           Starting patient on Testosterone as his was low at last check.  Will also give him refill for his anxiety meds  Suggested patient get in contact with insurance company to see if they will help him find a therapist.  Return in about 3 months (around 12/13/2023) for F/U.   Total time spent: 20 minutes  Miki Kins, FNP  09/14/2023  This document may have been prepared by [facility] Voice Recognition software and as such may include unintentional dictation errors.

## 2023-12-17 ENCOUNTER — Encounter: Payer: Self-pay | Admitting: Family

## 2023-12-17 ENCOUNTER — Ambulatory Visit (INDEPENDENT_AMBULATORY_CARE_PROVIDER_SITE_OTHER): Admitting: Family

## 2023-12-17 VITALS — BP 188/88 | HR 86 | Ht 73.0 in | Wt 165.4 lb

## 2023-12-17 DIAGNOSIS — E782 Mixed hyperlipidemia: Secondary | ICD-10-CM

## 2023-12-17 DIAGNOSIS — R5383 Other fatigue: Secondary | ICD-10-CM

## 2023-12-17 DIAGNOSIS — F1120 Opioid dependence, uncomplicated: Secondary | ICD-10-CM

## 2023-12-17 DIAGNOSIS — I10 Essential (primary) hypertension: Secondary | ICD-10-CM

## 2023-12-17 DIAGNOSIS — F419 Anxiety disorder, unspecified: Secondary | ICD-10-CM

## 2023-12-17 MED ORDER — ESCITALOPRAM OXALATE 20 MG PO TABS: 20.0000 mg | ORAL_TABLET | Freq: Every day | ORAL | 1 refills | Status: DC

## 2023-12-17 MED ORDER — ALPRAZOLAM 2 MG PO TABS: 2.0000 mg | ORAL_TABLET | Freq: Three times a day (TID) | ORAL | 2 refills | Status: DC | PRN

## 2023-12-17 MED ORDER — CLOPIDOGREL BISULFATE 75 MG PO TABS: 75.0000 mg | ORAL_TABLET | Freq: Every day | ORAL | 1 refills | Status: DC

## 2023-12-21 ENCOUNTER — Other Ambulatory Visit: Payer: Self-pay | Admitting: Internal Medicine

## 2024-01-06 DIAGNOSIS — Z79891 Long term (current) use of opiate analgesic: Secondary | ICD-10-CM | POA: Diagnosis not present

## 2024-01-06 DIAGNOSIS — G894 Chronic pain syndrome: Secondary | ICD-10-CM | POA: Diagnosis not present

## 2024-01-06 DIAGNOSIS — M47816 Spondylosis without myelopathy or radiculopathy, lumbar region: Secondary | ICD-10-CM | POA: Diagnosis not present

## 2024-01-06 DIAGNOSIS — M150 Primary generalized (osteo)arthritis: Secondary | ICD-10-CM | POA: Diagnosis not present

## 2024-01-25 ENCOUNTER — Encounter: Payer: Self-pay | Admitting: Family

## 2024-01-26 MED ORDER — AMOXICILLIN-POT CLAVULANATE 875-125 MG PO TABS
1.0000 | ORAL_TABLET | Freq: Two times a day (BID) | ORAL | 0 refills | Status: DC
Start: 2024-01-26 — End: 2024-04-07

## 2024-02-14 ENCOUNTER — Other Ambulatory Visit: Payer: Self-pay | Admitting: Gastroenterology

## 2024-02-15 ENCOUNTER — Ambulatory Visit

## 2024-02-15 ENCOUNTER — Ambulatory Visit: Payer: Medicare HMO | Attending: Cardiovascular Disease | Admitting: Cardiovascular Disease

## 2024-02-15 ENCOUNTER — Encounter: Payer: Self-pay | Admitting: Cardiovascular Disease

## 2024-02-15 VITALS — BP 160/64 | HR 87 | Ht 73.0 in | Wt 156.2 lb

## 2024-02-15 DIAGNOSIS — K551 Chronic vascular disorders of intestine: Secondary | ICD-10-CM

## 2024-02-15 DIAGNOSIS — R002 Palpitations: Secondary | ICD-10-CM

## 2024-02-15 DIAGNOSIS — Z720 Tobacco use: Secondary | ICD-10-CM

## 2024-02-15 DIAGNOSIS — R0602 Shortness of breath: Secondary | ICD-10-CM | POA: Diagnosis not present

## 2024-02-15 DIAGNOSIS — I493 Ventricular premature depolarization: Secondary | ICD-10-CM

## 2024-02-15 DIAGNOSIS — E785 Hyperlipidemia, unspecified: Secondary | ICD-10-CM | POA: Diagnosis not present

## 2024-02-15 DIAGNOSIS — I10 Essential (primary) hypertension: Secondary | ICD-10-CM | POA: Diagnosis not present

## 2024-02-15 DIAGNOSIS — I2510 Atherosclerotic heart disease of native coronary artery without angina pectoris: Secondary | ICD-10-CM

## 2024-02-15 NOTE — Patient Instructions (Signed)
 Medication Instructions:  No changes *If you need a refill on your cardiac medications before your next appointment, please call your pharmacy*  Lab Work: None ordered If you have labs (blood work) drawn today and your tests are completely normal, you will receive your results only by: MyChart Message (if you have MyChart) OR A paper copy in the mail If you have any lab test that is abnormal or we need to change your treatment, we will call you to review the results.  Testing/Procedures: Your physician has requested that you have an echocardiogram. Echocardiography is a painless test that uses sound waves to create images of your heart. It provides your doctor with information about the size and shape of your heart and how well your heart's chambers and valves are working.   You may receive an ultrasound enhancing agent through an IV if needed to better visualize your heart during the echo. This procedure takes approximately one hour.  There are no restrictions for this procedure.  This will take place at [address] [facility] [address], [place] [address]  Please note: We ask at that you not bring children with you during ultrasound (echo/ vascular) testing. Due to room size and safety concerns, children are not allowed in the ultrasound rooms during exams. Our front office staff cannot provide observation of children in our lobby area while testing is being conducted. An adult accompanying a patient to their appointment will only be allowed in the ultrasound room at the discretion of the ultrasound technician under special circumstances. We apologize for any inconvenience.   Follow-Up: At [facility], you and your health needs are our priority.  As part of our continuing mission to provide you with exceptional heart care, our providers are all part of one team.  This team includes your primary Cardiologist (physician) and Advanced Practice Providers or APPs  (Physician Assistants and Nurse Practitioners) who all work together to provide you with the care you need, when you need it.  Your next appointment:   3 month(s)  Provider:   You may see Antionette Kirks, MD or one of the following Advanced Practice Providers on your designated Care Team:   Laneta Pintos, NP Gildardo Labrador, PA-C Varney Gentleman, PA-C Cadence Grimes, PA-C Ronald Cockayne, NP Morey Ar, NP    We recommend signing up for the patient portal called "MyChart".  Sign up information is provided on this After Visit Summary.  MyChart is used to connect with patients for Virtual Visits (Telemedicine).  Patients are able to view lab/test results, encounter notes, upcoming appointments, etc.  Non-urgent messages can be sent to your provider as well.   To learn more about what you can do with MyChart, go to [email or web address].   Other Instructions ZIO XT- Long Term Monitor Instructions  Your physician has requested you wear a ZIO patch monitor for 3 days.  This is a single patch monitor. [employer] supplies one patch monitor per enrollment. Additional stickers are not available. Please do not apply patch if you will be having a Nuclear Stress Test,  Echocardiogram, Cardiac CT, MRI, or Chest Xray during the period you would be wearing the  monitor. The patch cannot be worn during these tests. You cannot remove and re-apply the  ZIO XT patch monitor.  Your ZIO patch monitor will be mailed 3 day USPS to your address on file. It may take 3-5 days  to receive your monitor after you have been enrolled.  Once you have received  your monitor, please review the enclosed instructions. Your monitor  has already been registered assigning a specific monitor serial # to you.  Billing and Patient Assistance Program Information  We have supplied [employer] with any of your insurance information on file for billing purposes. [employer] offers a sliding scale Patient Assistance Program for patients  that do not have  insurance, or whose insurance does not completely cover the cost of the ZIO monitor.  You must apply for the Patient Assistance Program to qualify for this discounted rate.  To apply, please call [employer] at [phone], select option 4, select option 2, ask to apply for  Patient Assistance Program. Sanna Crystal will ask your household income, and how many people  are in your household. They will quote your out-of-pocket cost based on that information.  [employer] will also be able to set up a 67-month, interest-free payment plan if needed.  Applying the monitor   Shave hair from upper left chest.  Hold abrader disc by orange tab. Rub abrader in 40 strokes over the upper left chest as  indicated in your monitor instructions.  Clean area with 4 enclosed alcohol pads. Let dry.  Apply patch as indicated in monitor instructions. Patch will be placed under collarbone on left  side of chest with arrow pointing upward.  Rub patch adhesive wings for 2 minutes. Remove white label marked "1". Remove the white  label marked "2". Rub patch adhesive wings for 2 additional minutes.  While looking in a mirror, press and release button in center of patch. A small green light will  flash 3-4 times. This will be your only indicator that the monitor has been turned on.  Do not shower for the first 24 hours. You may shower after the first 24 hours.  Press the button if you feel a symptom. You will hear a small click. Record Date, Time and  Symptom in the Patient Logbook.  When you are ready to remove the patch, follow instructions on the last 2 pages of Patient  Logbook. Stick patch monitor onto the last page of Patient Logbook.  Place Patient Logbook in the blue and white box. Use locking tab on box and tape box closed  securely. The blue and white box has prepaid postage on it. Please place it in the mailbox as  soon as possible. Your physician should have your test results approximately 7 days  after the  monitor has been mailed back to [facility].  Call [facility] Customer Care at [phone] if you have questions regarding  your ZIO XT patch monitor. Call them immediately if you see an orange light blinking on your  monitor.  If your monitor falls off in less than 4 days, contact our Monitor department at [phone].  If your monitor becomes loose or falls off after 4 days call [employer] at [phone] for  suggestions on securing your monitor

## 2024-02-15 NOTE — Progress Notes (Unsigned)
 Cardiology Office Note   Date:  02/16/2024   ID:  Joel Howe, DOB May 16, 1956, MRN [id number]  PCP:  Trenda Frisk, FNP  Cardiologist:   Antionette Kirks, MD   No chief complaint on file.     History of Present Illness: Joel Howe is a 68 y.o. male who presents for a follow-up visit regarding coronary artery disease.    He has known history of coronary artery disease status post CABG in 2007 at [facility], hypertension, tobacco use, chronic pain on narcotics and hyperlipidemia. Most recent cardiac catheterization in 2016 showed significant three-vessel coronary artery disease with patent grafts including LIMA to LAD, SVG to OM and SVG to right PDA.  There was 50 to 60% stenosis in ostial SVG to right PDA. He was hospitalized in 2021 at [facility] with blood in stool and was suspected of having ischemic colitis.  He was hypotensive on presentation but responded to IV fluids.  They reported excessive alcohol use.  The patient reports drinking some vodka before that after the death of his mom.  He quit drinking alcohol since that time. However, he continues to smoke 1 pack/day.  He has not been seen in our clinic in 2 years.  He reports worsening exertional dyspnea with no chest pain.  He has noted to have frequent PVCs today but denies palpitations.  He does report mild dizziness.     Past Medical History:  Diagnosis Date   Anginal pain (HCC)    Anxiety    Arthritis    Chronic back pain    Coronary artery disease    CABG in 2007 at [facility] with LIMA to LAD, SVG to OM and SVG to right PDA   Hyperlipidemia    Hypertension    Lyme disease    Multilevel degenerative disc disease    Myocardial infarction (HCC) 12/2005   Tobacco use     Past Surgical History:  Procedure Laterality Date   CARDIAC CATHETERIZATION N/A 05/07/2015   Procedure: Left Heart Cath;  Surgeon: Cherrie Cornwall, MD;  Location: [facility] INVASIVE CV LAB;  Service: Cardiovascular;  Laterality: N/A;   COLONOSCOPY WITH PROPOFOL   N/A 11/18/2020   Procedure: COLONOSCOPY WITH BIOPSY;  Surgeon: Marnee Sink, MD;  Location: [facility];  Service: Endoscopy;  Laterality: N/A;   CORONARY ANGIOPLASTY     CORONARY ARTERY BYPASS GRAFT  02/2006   4 vessel.  [facility]   ESOPHAGOGASTRODUODENOSCOPY (EGD) WITH PROPOFOL  N/A 12/04/2022   Procedure: ESOPHAGOGASTRODUODENOSCOPY (EGD) WITH PROPOFOL ;  Surgeon: Marnee Sink, MD;  Location: [facility];  Service: Endoscopy;  Laterality: N/A;   POLYPECTOMY N/A 11/18/2020   Procedure: POLYPECTOMY;  Surgeon: Marnee Sink, MD;  Location: [facility];  Service: Endoscopy;  Laterality: N/A;   VISCERAL ANGIOGRAPHY N/A 01/21/2023   Procedure: VISCERAL ANGIOGRAPHY;  Surgeon: Celso College, MD;  Location: [facility] INVASIVE CV LAB;  Service: Cardiovascular;  Laterality: N/A;     Current Outpatient Medications  Medication Sig Dispense Refill   albuterol  (VENTOLIN  HFA) 108 (90 Base) MCG/ACT inhaler Inhale 2 puffs into the lungs every 6 (six) hours as needed for wheezing or shortness of breath. 8 g 2   alprazolam  (XANAX ) 2 MG tablet Take 1 tablet (2 mg total) by mouth 3 (three) times daily as needed for anxiety. 90 tablet 2   amoxicillin -clavulanate (AUGMENTIN ) 875-125 MG tablet Take 1 tablet by mouth 2 (two) times daily. 20 tablet 0   Ascorbic Acid (VITAMIN C) 1000 MG tablet Take 1,000  mg by mouth daily.     carvedilol  (COREG ) 25 MG tablet Take 1 tablet (25 mg total) by mouth 2 (two) times daily with a meal. 180 tablet 3   Cholecalciferol (VITAMIN D ) 50 MCG (2000 UT) CAPS Take by mouth daily.     clopidogrel  (PLAVIX ) 75 MG tablet Take 1 tablet (75 mg total) by mouth daily. 90 tablet 1   cyclobenzaprine (FLEXERIL) 10 MG tablet Take 10 mg by mouth 3 (three) times daily as needed for muscle spasms.     escitalopram  (LEXAPRO ) 20 MG tablet Take 1 tablet (20 mg total) by mouth daily. 90 tablet 1   lisinopril (ZESTRIL) 40 MG tablet TAKE 1 TABLET BY MOUTH EVERY MORNING 100 tablet 0   Melatonin 10 MG  TABS Take by mouth at bedtime.     Multiple Vitamins-Minerals (CENTRUM SILVER 50+MEN) TABS daily.     oxyCODONE-acetaminophen  (PERCOCET) 10-325 MG per tablet Take 1 tablet by mouth every 6 (six) hours as needed for pain.     pantoprazole  (PROTONIX ) 40 MG tablet TAKE 1 TABLET(40 MG) BY MOUTH DAILY 30 tablet 2   rosuvastatin (CRESTOR) 40 MG tablet TAKE 1 TABLET BY MOUTH EVERY DAY AT BEDTIME 100 tablet 2   tamsulosin  (FLOMAX ) 0.4 MG CAPS capsule Take 1 capsule (0.4 mg total) by mouth daily. 90 capsule 1   Testosterone  Enanthate (XYOSTED ) 75 MG/0.5ML SOAJ Inject 75 mg into the skin once a week. 2 mL 1   Current Facility-Administered Medications  Medication Dose Route Frequency Provider Last Rate Last Admin   albuterol  (VENTOLIN  HFA) 108 (90 Base) MCG/ACT inhaler 2 puff  2 puff Inhalation Once         Allergies:   Morphine  and codeine and Propranolol    Social History:  The patient  reports that he has been smoking cigarettes. He started smoking about 48 years ago. He has a 22.5 pack-year smoking history. He has never used smokeless tobacco. He reports that he does not drink alcohol and does not use drugs.   Family History:  The patient's family history includes Anxiety disorder in his daughter; Heart attack in his brother, brother, father, mother, and sister; Hyperlipidemia in his daughter; Migraines in his daughter.    ROS:  Please see the history of present illness.   Otherwise, review of systems are positive for none.   All other systems are reviewed and negative.    PHYSICAL EXAM: VS:  BP (!) 160/64   Pulse 87   Ht 6\' 1"  (1.854 m)   Wt 156 lb 3.2 oz (70.9 kg)   SpO2 97%   BMI 20.61 kg/m  , BMI Body mass index is 20.61 kg/m. GEN: Well nourished, well developed, in no acute distress  HEENT: normal  Neck: no JVD, carotid bruits, or masses Cardiac: RRR; no murmurs, rubs, or gallops,no edema  Respiratory:  clear to auscultation bilaterally, normal work of breathing GI: soft,  nontender, nondistended, + BS MS: no deformity or atrophy  Skin: warm and dry, no rash Neuro:  Strength and sensation are intact Psych: euthymic mood, full affect   EKG:  EKG is ordered today. The ekg ordered today demonstrates: Sinus rhythm with frequent Premature ventricular complexes in a pattern of bigeminy Right atrial enlargement Rightward axis Pulmonary disease pattern Incomplete right bundle branch block Abnormal QRS-T angle, consider primary T wave abnormality When compared with ECG of 06-Nov-2021 08:33, Premature ventricular complexes are now Present T wave inversion now evident in Inferior leads Nonspecific T wave abnormality no  longer evident in Lateral leads    Recent Labs: 02/17/2023: Hemoglobin 15.8 09/13/2023: ALT 41; BUN 9; Creatinine, Ser 0.78; Potassium 4.3; Sodium 137; TSH 2.920    Lipid Panel    Component Value Date/Time   CHOL 120 09/13/2023 1349   TRIG 75 09/13/2023 1349   HDL 49 09/13/2023 1349   CHOLHDL 2.4 09/13/2023 1349   LDLCALC 56 09/13/2023 1349      Wt Readings from Last 3 Encounters:  02/15/24 156 lb 3.2 oz (70.9 kg)  12/17/23 165 lb 6.4 oz (75 kg)  09/29/23 155 lb (70.3 kg)          11/25/2018    2:00 PM  PAD Screen  Previous PAD dx? No  Previous surgical procedure? No  Pain with walking? Yes  Subsides with rest? No  Feet/toe relief with dangling? No  Painful, non-healing ulcers? No  Extremities discolored? No      ASSESSMENT AND PLAN:  1.  Coronary artery disease involving native coronary arteries without angina: He reports worsening exertional dyspnea without chest pain.  This could be due to continued excessive tobacco use.  Nonetheless, we will have to evaluate his LV systolic and diastolic function.  I requested an echocardiogram.  2.  Essential hypertension: His blood pressure is elevated today but he forgot to take his morning medications.  3.  Hyperlipidemia: Continue high-dose rosuvastatin with a target LDL of  less than 70.  Most recent lipid profile done in December 2024 showed an LDL of 56.  4.  Tobacco use: I discussed the importance of cessation.  5.  Frequent PVCs: He is noted to have ventricular bigeminy.  His labs were unremarkable in December including thyroid  function and electrolytes.  Will have to check his LV systolic function.  I requested an echocardiogram.  Will also obtain a 3-day ZIO monitor to quantify the PVCs and see if therapy is warranted.  Continue carvedilol .   Disposition:   FU with me in 3 months.  Signed,  Antionette Kirks, MD  02/16/2024 12:58 PM    [facility]

## 2024-02-17 ENCOUNTER — Other Ambulatory Visit: Payer: Self-pay

## 2024-02-17 ENCOUNTER — Emergency Department

## 2024-02-17 ENCOUNTER — Emergency Department
Admission: EM | Admit: 2024-02-17 | Discharge: 2024-02-17 | Disposition: A | Discharge status: Home / Self Care | Attending: Emergency Medicine | Admitting: Emergency Medicine

## 2024-02-17 DIAGNOSIS — F4310 Post-traumatic stress disorder, unspecified: Secondary | ICD-10-CM | POA: Diagnosis not present

## 2024-02-17 DIAGNOSIS — W19XXXA Unspecified fall, initial encounter: Secondary | ICD-10-CM | POA: Diagnosis not present

## 2024-02-17 DIAGNOSIS — M47812 Spondylosis without myelopathy or radiculopathy, cervical region: Secondary | ICD-10-CM | POA: Diagnosis not present

## 2024-02-17 DIAGNOSIS — J439 Emphysema, unspecified: Secondary | ICD-10-CM | POA: Diagnosis not present

## 2024-02-17 DIAGNOSIS — F10920 Alcohol use, unspecified with intoxication, uncomplicated: Secondary | ICD-10-CM | POA: Diagnosis not present

## 2024-02-17 DIAGNOSIS — Z043 Encounter for examination and observation following other accident: Secondary | ICD-10-CM | POA: Diagnosis not present

## 2024-02-17 DIAGNOSIS — S0001XA Abrasion of scalp, initial encounter: Secondary | ICD-10-CM

## 2024-02-17 DIAGNOSIS — R609 Edema, unspecified: Secondary | ICD-10-CM | POA: Diagnosis not present

## 2024-02-17 DIAGNOSIS — M4802 Spinal stenosis, cervical region: Secondary | ICD-10-CM | POA: Diagnosis not present

## 2024-02-17 DIAGNOSIS — I6523 Occlusion and stenosis of bilateral carotid arteries: Secondary | ICD-10-CM | POA: Diagnosis not present

## 2024-02-17 DIAGNOSIS — S0990XA Unspecified injury of head, initial encounter: Secondary | ICD-10-CM | POA: Diagnosis present

## 2024-02-17 DIAGNOSIS — R7309 Other abnormal glucose: Secondary | ICD-10-CM | POA: Insufficient documentation

## 2024-02-17 DIAGNOSIS — F10129 Alcohol abuse with intoxication, unspecified: Secondary | ICD-10-CM | POA: Diagnosis not present

## 2024-02-17 DIAGNOSIS — I10 Essential (primary) hypertension: Secondary | ICD-10-CM | POA: Diagnosis not present

## 2024-02-17 DIAGNOSIS — S0003XA Contusion of scalp, initial encounter: Secondary | ICD-10-CM | POA: Insufficient documentation

## 2024-02-17 DIAGNOSIS — R220 Localized swelling, mass and lump, head: Secondary | ICD-10-CM | POA: Diagnosis not present

## 2024-02-17 LAB — CBC WITH DIFFERENTIAL/PLATELET
Abs Immature Granulocytes: 0.02 10*3/uL (ref 0.00–0.07)
Basophils Absolute: 0 10*3/uL (ref 0.0–0.1)
Basophils Relative: 1 %
Eosinophils Absolute: 0.1 10*3/uL (ref 0.0–0.5)
Eosinophils Relative: 1 %
HCT: 42.9 % (ref 39.0–52.0)
Hemoglobin: 14.7 g/dL (ref 13.0–17.0)
Immature Granulocytes: 0 %
Lymphocytes Relative: 22 %
Lymphs Abs: 1.6 10*3/uL (ref 0.7–4.0)
MCH: 32.7 pg (ref 26.0–34.0)
MCHC: 34.3 g/dL (ref 30.0–36.0)
MCV: 95.5 fL (ref 80.0–100.0)
Monocytes Absolute: 0.7 10*3/uL (ref 0.1–1.0)
Monocytes Relative: 10 %
Neutro Abs: 4.6 10*3/uL (ref 1.7–7.7)
Neutrophils Relative %: 66 %
Platelets: 200 10*3/uL (ref 150–400)
RBC: 4.49 MIL/uL (ref 4.22–5.81)
RDW: 12.6 % (ref 11.5–15.5)
WBC: 7 10*3/uL (ref 4.0–10.5)
nRBC: 0 % (ref 0.0–0.2)

## 2024-02-17 LAB — CBG MONITORING, ED: Glucose-Capillary: 107 mg/dL — ABNORMAL HIGH (ref 70–99)

## 2024-02-17 LAB — COMPREHENSIVE METABOLIC PANEL WITH GFR
ALT: 15 U/L (ref 0–44)
AST: 23 U/L (ref 15–41)
Albumin: 3.8 g/dL (ref 3.5–5.0)
Alkaline Phosphatase: 41 U/L (ref 38–126)
Anion gap: 10 (ref 5–15)
BUN: <5 mg/dL — ABNORMAL LOW (ref 8–23)
CO2: 25 mmol/L (ref 22–32)
Calcium: 8.4 mg/dL — ABNORMAL LOW (ref 8.9–10.3)
Chloride: 101 mmol/L (ref 98–111)
Creatinine, Ser: 0.58 mg/dL — ABNORMAL LOW (ref 0.61–1.24)
GFR, Estimated: >60 mL/min (ref >60)
Glucose, Bld: 68 mg/dL — ABNORMAL LOW (ref 70–99)
Potassium: 3.2 mmol/L — ABNORMAL LOW (ref 3.5–5.1)
Sodium: 136 mmol/L (ref 135–145)
Total Bilirubin: 0.7 mg/dL (ref 0.0–1.2)
Total Protein: 6.2 g/dL — ABNORMAL LOW (ref 6.5–8.1)

## 2024-02-17 LAB — ACETAMINOPHEN LEVEL: Acetaminophen (Tylenol), Serum: <10 ug/mL — ABNORMAL LOW (ref 10–30)

## 2024-02-17 LAB — ETHANOL: Alcohol, Ethyl (B): 219 mg/dL — ABNORMAL HIGH (ref <15)

## 2024-02-17 LAB — SALICYLATE LEVEL: Salicylate Lvl: <7 mg/dL — ABNORMAL LOW (ref 7.0–30.0)

## 2024-02-17 NOTE — Discharge Instructions (Signed)
 Please follow-up with your primary care provider at home for reassessment.  Please limit alcohol use.  Please return for any worsening symptoms.

## 2024-02-17 NOTE — ED Triage Notes (Addendum)
 Pt in via [facility] from home. Pt had an unwitnessed fall. ETOH use today. Is on Eliquis. Normal this morning and wife caught him on home cameras going to the store and found him at 5pm in the backyard. Pt states that he slipped from the couch but did not pass out. Does not recall hitting his head but does appear to have a hematoma to the left front forehead. Pt states he has been having dreams about the war and states "I just want peace." Pt states he has had a pint of liquor today.  122/84 99% 100 HR

## 2024-02-17 NOTE — ED Provider Notes (Signed)
 [facility] Provider Note    Event Date/Time   First MD Initiated Contact with Patient 02/17/24 1805     (approximate)   History   Fall   HPI Joel Howe is a 68 y.o. male with history of depression, HTN, anxiety presenting today for fall.  Patient reportedly had alcohol use today and was out walking when he had a fall and found down by his wife.  Notable hematoma to the head.  Patient is tearful when asked about today's events.  Stated he drink a pint of liquor.  States he has flashbacks and PTSD from being in the war.  Denies wanting to hurt himself or hurt other people.  Denies injury elsewhere to his body from the fall.  Denies loss of consciousness.     Physical Exam   Triage Vital Signs: ED Triage Vitals  Encounter Vitals Group     BP      Systolic BP Percentile      Diastolic BP Percentile      Pulse      Resp      Temp      Temp src      SpO2      Weight      Height      Head Circumference      Peak Flow      Pain Score      Pain Loc      Pain Education      Exclude from Growth Chart     Most recent vital signs: Vitals:   02/17/24 1900 02/17/24 1930  BP: 138/75 136/85  Pulse: 78 81  Resp: 17 19  SpO2: 90% 94%    I have reviewed the vital signs. General:  Awake, alert, no acute distress.  Intoxicated. Head:  Normocephalic, large hematoma to left frontal scalp with slight abrasion but no laceration. EENT:  PERRL, EOMI, Oral mucosa pink and moist, Neck is supple. Cardiovascular: Regular rate, 2+ distal pulses. Respiratory:  Normal respiratory effort, symmetrical expansion, no distress.   Extremities:  Moving all four extremities through full ROM without pain.   Neuro:  Alert and oriented.  Interacting appropriately.  Intoxicated Skin:  Warm, dry, no rash.   Psych: Tearful   ED Results / Procedures / Treatments   Labs (all labs ordered are listed, but only abnormal results are displayed) Labs Reviewed  COMPREHENSIVE  METABOLIC PANEL WITH GFR - Abnormal; Notable for the following components:      Result Value   Potassium 3.2 (*)    Glucose, Bld 68 (*)    BUN <5 (*)    Creatinine, Ser 0.58 (*)    Calcium 8.4 (*)    Total Protein 6.2 (*)    All other components within normal limits  ACETAMINOPHEN  LEVEL - Abnormal; Notable for the following components:   Acetaminophen  (Tylenol ), Serum <10 (*)    All other components within normal limits  SALICYLATE LEVEL - Abnormal; Notable for the following components:   Salicylate Lvl <7.0 (*)    All other components within normal limits  ETHANOL - Abnormal; Notable for the following components:   Alcohol, Ethyl (B) 219 (*)    All other components within normal limits  CBC WITH DIFFERENTIAL/PLATELET     EKG    RADIOLOGY Independently interpreted CT head and C-spine with no acute traumatic injuries other than scalp hematoma   PROCEDURES:  Critical Care performed: No  Procedures   MEDICATIONS ORDERED IN ED:  Medications - No data to display   IMPRESSION / MDM / ASSESSMENT AND PLAN / ED COURSE  I reviewed the triage vital signs and the nursing notes.                              Differential diagnosis includes, but is not limited to, alcohol intoxication, scalp hematoma, ICH, cervical spine injury, depression, PTSD  Patient's presentation is most consistent with acute presentation with potential threat to life or bodily function.  Patient is a 68 year old male presenting today for ground-level fall with head injury and notably intoxicated on alcohol.  Does have scalp hematoma on the right frontal region we will get CT head and C-spine for further evaluation.  CT head and C-spine showed no acute traumatic injuries outside of scalp hematoma.  Laboratory workup otherwise reassuring.  Patient was clinically sober and wife will come pick him up.  He was very tearful with concerns of possible PTSD but he declined having psychiatry evaluate him here on  multiple attempts.  No SI or HI.  Will discharge with wife and given strict return precautions.  Clinical Course as of 02/17/24 2033  Thu Feb 17, 2024  2005 Workup largely reassuring.  Discussed with patient having psychiatry come and see him given concern for PTSD.  He states he does not want to talk with them and just wants to go home at this point.  Denying SI or HI.  Appears clinically sober at this point. [DW]    Clinical Course User Index [DW] Kandee Orion, MD     FINAL CLINICAL IMPRESSION(S) / ED DIAGNOSES   Final diagnoses:  Alcoholic intoxication without complication (HCC)  Fall, initial encounter  Abrasion of scalp, initial encounter     Rx / DC Orders   ED Discharge Orders     None        Note:  This document was prepared using Dragon voice recognition software and may include unintentional dictation errors.   Kandee Orion, MD 02/17/24 2033

## 2024-02-17 NOTE — ED Notes (Signed)
 Dr wells with pt.

## 2024-02-17 NOTE — ED Notes (Signed)
Pt voided 1000 cc urine

## 2024-02-17 NOTE — ED Notes (Signed)
 Wounds cleaned on forehead.  Fsbs 107  pt discharged to wife.  Iv dc'ed.

## 2024-03-02 DIAGNOSIS — M47816 Spondylosis without myelopathy or radiculopathy, lumbar region: Secondary | ICD-10-CM | POA: Diagnosis not present

## 2024-03-02 DIAGNOSIS — M150 Primary generalized (osteo)arthritis: Secondary | ICD-10-CM | POA: Diagnosis not present

## 2024-03-02 DIAGNOSIS — Z79891 Long term (current) use of opiate analgesic: Secondary | ICD-10-CM | POA: Diagnosis not present

## 2024-03-02 DIAGNOSIS — G894 Chronic pain syndrome: Secondary | ICD-10-CM | POA: Diagnosis not present

## 2024-03-07 DIAGNOSIS — R002 Palpitations: Secondary | ICD-10-CM | POA: Diagnosis not present

## 2024-03-20 ENCOUNTER — Encounter: Payer: Self-pay | Admitting: Family

## 2024-03-20 ENCOUNTER — Ambulatory Visit: Payer: Self-pay | Admitting: Cardiovascular Disease

## 2024-03-20 ENCOUNTER — Ambulatory Visit (INDEPENDENT_AMBULATORY_CARE_PROVIDER_SITE_OTHER): Admitting: Family

## 2024-03-20 DIAGNOSIS — E538 Deficiency of other specified B group vitamins: Secondary | ICD-10-CM | POA: Diagnosis not present

## 2024-03-20 DIAGNOSIS — R7303 Prediabetes: Secondary | ICD-10-CM | POA: Diagnosis not present

## 2024-03-20 DIAGNOSIS — I10 Essential (primary) hypertension: Secondary | ICD-10-CM

## 2024-03-20 DIAGNOSIS — E782 Mixed hyperlipidemia: Secondary | ICD-10-CM | POA: Diagnosis not present

## 2024-03-20 DIAGNOSIS — E559 Vitamin D deficiency, unspecified: Secondary | ICD-10-CM

## 2024-03-20 DIAGNOSIS — R5383 Other fatigue: Secondary | ICD-10-CM | POA: Diagnosis not present

## 2024-03-20 DIAGNOSIS — R002 Palpitations: Secondary | ICD-10-CM | POA: Diagnosis not present

## 2024-03-20 MED ORDER — MAGNESIUM OXIDE 400 MG PO TABS: 400.0000 mg | ORAL_TABLET | Freq: Every day | ORAL | Status: AC

## 2024-03-20 NOTE — Progress Notes (Signed)
 Established Patient Office Visit  Subjective:  [name], male    DOB: [birth year]  Age: 68 y.o. MRN: [id number]  Chief Complaint  Patient presents with   Follow-up    3 month follow up    Patient is here today for his 3 months follow up.  He has been feeling fairly well since last appointment.   He does not have additional concerns to discuss today.  Labs are due today. He needs refills.   I have reviewed his active problem list, medication list, allergies, health maintenance, notes from last encounter, lab results for his appointment today.      No other concerns at this time.   Past Medical History:  Diagnosis Date   Anginal pain (HCC)    Anxiety    Arthritis    Chronic back pain    Coronary artery disease    CABG in 2007 at [facility] with LIMA to LAD, SVG to OM and SVG to right PDA   Hyperlipidemia    Hypertension    Lyme disease    Multilevel degenerative disc disease    Myocardial infarction (HCC) 12/2005   Tobacco use     Past Surgical History:  Procedure Laterality Date   CARDIAC CATHETERIZATION N/A 05/07/2015   Procedure: Left Heart Cath;  Surgeon: Cherrie Cornwall, MD;  Location: [facility];  Service: Cardiovascular;  Laterality: N/A;   COLONOSCOPY WITH PROPOFOL  N/A 11/18/2020   Procedure: COLONOSCOPY WITH BIOPSY;  Surgeon: Marnee Sink, MD;  Location: [facility];  Service: Endoscopy;  Laterality: N/A;   CORONARY ANGIOPLASTY     CORONARY ARTERY BYPASS GRAFT  02/2006   4 vessel.  [facility]   ESOPHAGOGASTRODUODENOSCOPY (EGD) WITH PROPOFOL  N/A 12/04/2022   Procedure: ESOPHAGOGASTRODUODENOSCOPY (EGD) WITH PROPOFOL ;  Surgeon: Marnee Sink, MD;  Location: [facility];  Service: Endoscopy;  Laterality: N/A;   POLYPECTOMY N/A 11/18/2020   Procedure: POLYPECTOMY;  Surgeon: Marnee Sink, MD;  Location: [facility];  Service: Endoscopy;  Laterality: N/A;   VISCERAL ANGIOGRAPHY N/A 01/21/2023   Procedure: VISCERAL ANGIOGRAPHY;   Surgeon: Celso College, MD;  Location: [facility];  Service: Cardiovascular;  Laterality: N/A;    Social History   Socioeconomic History   Marital status: Married    Spouse name: Not on file   Number of children: Not on file   Years of education: Not on file   Highest education level: Not on file  Occupational History   Not on file  Tobacco Use   Smoking status: Every Day    Current packs/day: 0.00    Average packs/day: 0.5 packs/day for 45.0 years (22.5 ttl pk-yrs)    Types: Cigarettes    Start date: 07/10/1975    Last attempt to quit: 07/09/2020    Years since quitting: 3.6   Smokeless tobacco: Never  Vaping Use   Vaping status: Every Day   Start date: 07/12/2020   Substances: Nicotine-salt  Substance and Sexual Activity   Alcohol use: No   Drug use: No   Sexual activity: Not on file  Other Topics Concern   Not on file  Social History Narrative   Not on file   Social Drivers of Health   Financial Resource Strain: Not on file  Food Insecurity: Not on file  Transportation Needs: Not on file  Physical Activity: Not on file  Stress: Not on file  Social Connections: Not on file  Intimate Partner Violence: Not on file  Family History  Problem Relation Age of Onset   Heart attack Mother    Heart attack Father    Heart attack Sister    Heart attack Brother    Heart attack Brother    Migraines Daughter    Hyperlipidemia Daughter    Anxiety disorder Daughter     Allergies  Allergen Reactions   Morphine  And Codeine Other (See Comments)    Headaches and creepy skin crawling (11/07/20 - pt currently taking without issues)   Propranolol Hives    Review of Systems  All other systems reviewed and are negative.      Objective:   BP 138/78   Pulse 75   Ht 6\' 1"  (1.854 m)   Wt 159 lb 3.2 oz (72.2 kg)   SpO2 94%   BMI 21.00 kg/m   Vitals:   03/20/24 1358  BP: 138/78  Pulse: 75  Height: 6\' 1"  (1.854 m)  Weight: 159 lb 3.2 oz (72.2 kg)  SpO2:  94%  BMI (Calculated): 21.01    Physical Exam Vitals and nursing note reviewed.  Constitutional:      Appearance: Normal appearance. He is normal weight.  Eyes:     Pupils: Pupils are equal, round, and reactive to light.  Cardiovascular:     Rate and Rhythm: Normal rate and regular rhythm.     Pulses: Normal pulses.     Heart sounds: Normal heart sounds.  Pulmonary:     Effort: Pulmonary effort is normal.     Breath sounds: Normal breath sounds.  Neurological:     General: No focal deficit present.     Mental Status: He is alert and oriented to person, place, and time.  Psychiatric:        Mood and Affect: Mood normal.        Behavior: Behavior normal.        Thought Content: Thought content normal.        Judgment: Judgment normal.      No results found for any visits on 03/20/24.  Recent Results (from the past 2160 hours)  CBC with Differential     Status: None   Collection Time: 02/17/24  6:17 PM  Result Value Ref Range   WBC 7.0 4.0 - 10.5 K/uL   RBC 4.49 4.22 - 5.81 MIL/uL   Hemoglobin 14.7 13.0 - 17.0 g/dL   HCT 16.1 09.6 - 04.5 %   MCV 95.5 80.0 - 100.0 fL   MCH 32.7 26.0 - 34.0 pg   MCHC 34.3 30.0 - 36.0 g/dL   RDW 40.9 81.1 - 91.4 %   Platelets 200 150 - 400 K/uL   nRBC 0.0 0.0 - 0.2 %   Neutrophils Relative % 66 %   Neutro Abs 4.6 1.7 - 7.7 K/uL   Lymphocytes Relative 22 %   Lymphs Abs 1.6 0.7 - 4.0 K/uL   Monocytes Relative 10 %   Monocytes Absolute 0.7 0.1 - 1.0 K/uL   Eosinophils Relative 1 %   Eosinophils Absolute 0.1 0.0 - 0.5 K/uL   Basophils Relative 1 %   Basophils Absolute 0.0 0.0 - 0.1 K/uL   Immature Granulocytes 0 %   Abs Immature Granulocytes 0.02 0.00 - 0.07 K/uL    Comment: Performed at [facility], [address] [place], [place] [address]  Comprehensive metabolic panel     Status: Abnormal   Collection Time: 02/17/24  6:17 PM  Result Value Ref Range   Sodium 136 135 - 145  mmol/L   Potassium 3.2 (L) 3.5 - 5.1 mmol/L    Chloride 101 98 - 111 mmol/L   CO2 25 22 - 32 mmol/L   Glucose, Bld 68 (L) 70 - 99 mg/dL    Comment: Glucose reference range applies only to samples taken after fasting for at least 8 hours.   BUN <5 (L) 8 - 23 mg/dL   Creatinine, Ser 1.61 (L) 0.61 - 1.24 mg/dL   Calcium 8.4 (L) 8.9 - 10.3 mg/dL   Total Protein 6.2 (L) 6.5 - 8.1 g/dL   Albumin 3.8 3.5 - 5.0 g/dL   AST 23 15 - 41 U/L   ALT 15 0 - 44 U/L   Alkaline Phosphatase 41 38 - 126 U/L   Total Bilirubin 0.7 0.0 - 1.2 mg/dL   GFR, Estimated >09 >60 mL/min    Comment: (NOTE) Calculated using the CKD-EPI Creatinine Equation (2021)    Anion gap 10 5 - 15    Comment: Performed at [facility], [address] [place], [place] [address]  Acetaminophen  level     Status: Abnormal   Collection Time: 02/17/24  6:17 PM  Result Value Ref Range   Acetaminophen  (Tylenol ), Serum <10 (L) 10 - 30 ug/mL    Comment: (NOTE) Therapeutic concentrations vary significantly. A range of 10-30 ug/mL  may be an effective concentration for many patients. However, some  are best treated at concentrations outside of this range. Acetaminophen  concentrations >150 ug/mL at 4 hours after ingestion  and >50 ug/mL at 12 hours after ingestion are often associated with  toxic reactions.  Performed at [facility], [address] [place], [place] [address]   Salicylate level     Status: Abnormal   Collection Time: 02/17/24  6:17 PM  Result Value Ref Range   Salicylate Lvl <7.0 (L) 7.0 - 30.0 mg/dL    Comment: Performed at [facility], [address] [place], [place] [address]  Ethanol     Status: Abnormal   Collection Time: 02/17/24  6:17 PM  Result Value Ref Range   Alcohol, Ethyl (B) 219 (H) <15 mg/dL    Comment: Please note change in reference range. (NOTE) For medical purposes only. Performed at [facility], [address] [place], [place] [address]   CBG monitoring, ED     Status: Abnormal    Collection Time: 02/17/24  8:08 PM  Result Value Ref Range   Glucose-Capillary 107 (H) 70 - 99 mg/dL    Comment: Glucose reference range applies only to samples taken after fasting for at least 8 hours.       Assessment & Plan:   Problem List Items Addressed This Visit       Cardiovascular and Mediastinum   Essential hypertension   Relevant Orders   CMP14+EGFR   CBC with Diff     Other   Hyperlipidemia   Relevant Orders   CMP14+EGFR   Lipid panel   CBC with Diff   Fatigue   Relevant Orders   CMP14+EGFR   TSH   CBC with Diff   Prediabetes   Relevant Orders   CMP14+EGFR   Hemoglobin A1c   CBC with Diff   Other Visit Diagnoses       B12 deficiency due to diet       Relevant Orders   CMP14+EGFR   Vitamin B12   CBC with Diff     Vitamin D  deficiency, unspecified       Relevant Orders  CMP14+EGFR   VITAMIN D  25 Hydroxy (Vit-D Deficiency, Fractures)   CBC with Diff       Return in about 3 months (around 06/20/2024).   Total time spent: 20 minutes  Trenda Frisk, FNP  03/20/2024   This document may have been prepared by [facility] Voice Recognition software and as such may include unintentional dictation errors.

## 2024-03-20 NOTE — Telephone Encounter (Signed)
 Patient is returning call.

## 2024-03-21 ENCOUNTER — Telehealth: Payer: Self-pay | Admitting: Family

## 2024-03-21 NOTE — Telephone Encounter (Signed)
 Patient left VM asking for a callback from the nurse. States he wants to speak about something that happened at his visit yesterday. Did not state what happened or what he needs.

## 2024-03-21 NOTE — Telephone Encounter (Signed)
 Patient left VM that he was seen yesterday and didn't get his refills. States he will be out of meds today. Did not state which med he needs.

## 2024-03-22 ENCOUNTER — Encounter: Payer: Self-pay | Admitting: Family

## 2024-03-22 ENCOUNTER — Other Ambulatory Visit: Payer: Self-pay | Admitting: Family

## 2024-03-27 ENCOUNTER — Ambulatory Visit: Attending: Cardiovascular Disease

## 2024-03-27 DIAGNOSIS — I5030 Unspecified diastolic (congestive) heart failure: Secondary | ICD-10-CM

## 2024-03-27 DIAGNOSIS — I080 Rheumatic disorders of both mitral and aortic valves: Secondary | ICD-10-CM

## 2024-03-27 DIAGNOSIS — R0602 Shortness of breath: Secondary | ICD-10-CM

## 2024-03-27 LAB — ECHOCARDIOGRAM COMPLETE
AR max vel: 2.81 cm2
AV Area VTI: 2.64 cm2
AV Area mean vel: 2.62 cm2
AV Mean grad: 4 mmHg
AV Peak grad: 6.7 mmHg
Ao pk vel: 1.29 m/s
Area-P 1/2: 3.85 cm2
S' Lateral: 4.52 cm

## 2024-03-28 ENCOUNTER — Ambulatory Visit: Admitting: Family

## 2024-03-31 ENCOUNTER — Ambulatory Visit: Attending: Nurse Practitioner | Admitting: Nurse Practitioner

## 2024-03-31 ENCOUNTER — Encounter: Payer: Self-pay | Admitting: Nurse Practitioner

## 2024-03-31 VITALS — BP 146/80 | HR 68 | Ht 72.0 in | Wt 157.6 lb

## 2024-03-31 DIAGNOSIS — I255 Ischemic cardiomyopathy: Secondary | ICD-10-CM | POA: Diagnosis not present

## 2024-03-31 DIAGNOSIS — Z720 Tobacco use: Secondary | ICD-10-CM | POA: Diagnosis not present

## 2024-03-31 DIAGNOSIS — I2510 Atherosclerotic heart disease of native coronary artery without angina pectoris: Secondary | ICD-10-CM | POA: Diagnosis not present

## 2024-03-31 DIAGNOSIS — E785 Hyperlipidemia, unspecified: Secondary | ICD-10-CM | POA: Diagnosis not present

## 2024-03-31 DIAGNOSIS — I25110 Atherosclerotic heart disease of native coronary artery with unstable angina pectoris: Secondary | ICD-10-CM

## 2024-03-31 DIAGNOSIS — I5020 Unspecified systolic (congestive) heart failure: Secondary | ICD-10-CM

## 2024-03-31 DIAGNOSIS — I10 Essential (primary) hypertension: Secondary | ICD-10-CM | POA: Diagnosis not present

## 2024-03-31 DIAGNOSIS — I493 Ventricular premature depolarization: Secondary | ICD-10-CM

## 2024-03-31 DIAGNOSIS — I200 Unstable angina: Secondary | ICD-10-CM

## 2024-03-31 MED ORDER — SACUBITRIL-VALSARTAN 49-51 MG PO TABS
1.0000 | ORAL_TABLET | Freq: Two times a day (BID) | ORAL | 1 refills | Status: DC
Start: 2024-03-31 — End: 2024-07-13

## 2024-03-31 MED ORDER — NITROGLYCERIN 0.4 MG SL SUBL: 0.4000 mg | SUBLINGUAL_TABLET | SUBLINGUAL | 1 refills | Status: AC | PRN

## 2024-03-31 NOTE — Progress Notes (Signed)
 Office Visit    Patient Name: [facility] Date of Encounter: 03/31/2024  Primary Care Provider:  Trenda Frisk, FNP Primary Cardiologist:  Antionette Kirks, MD  Chief Complaint    68 y.o. male with a history of CAD status post coronary artery bypass grafting, hypertension, hyperlipidemia, frequent PVCs, mild peripheral arterial disease, tobacco abuse, and chronic pain on narcotics, who presents for follow-up after recent finding of cardiomyopathy.  Past Medical History   Subjective   Past Medical History:  Diagnosis Date   Anginal pain (HCC)    Anxiety    Arthritis    Chronic back pain    Coronary artery disease    a. 2007 CABG ([facility]): LIMA to LAD, SVG to OM and RIMA to right PDA; b. 04/2015 Cath: LM nl, LAD 14m, LCX ok, OM1 100, RCA ok, RPDA 80, Inf septal 70, LIMA->LAD ok, VG->OM1 ok, RIMA->RPDA 50-60ost-->med rx.   HFrEF (heart failure with reduced ejection fraction) (HCC)    a. 03/2024 Echo: EF 30-35%, glob HK, GrI DD, mildly reduced RV fxn, mild MR, AoV sclerosis.   Hyperlipidemia    Hyperlipidemia LDL goal <70    Hypertension    Ischemic cardiomyopathy    a. 03/2024 Echo: EF 30-35%.   Lyme disease    Multilevel degenerative disc disease    Myocardial infarction (HCC) 12/2005   PAD (peripheral artery disease) (HCC)    a. 01/2023 Peripheral Angio: <30% celiac stenosis, no SMA stenosis, patent IMA.   PVC's (premature ventricular contractions)    a. 02/2024 Zio: Predominantly sinus rhythm, average 86 (62-154).  2 SVT runs, fastest 154 x 9 beats, longest 8 beats at 120.  3.1% PAC burden.  8.1% PVC burden.   Tobacco use    Past Surgical History:  Procedure Laterality Date   CARDIAC CATHETERIZATION N/A 05/07/2015   Procedure: Left Heart Cath;  Surgeon: Cherrie Cornwall, MD;  Location: [facility] INVASIVE CV LAB;  Service: Cardiovascular;  Laterality: N/A;   COLONOSCOPY WITH PROPOFOL  N/A 11/18/2020   Procedure: COLONOSCOPY WITH BIOPSY;  Surgeon: Marnee Sink, MD;  Location: [facility];  Service: Endoscopy;  Laterality: N/A;   CORONARY ANGIOPLASTY     CORONARY ARTERY BYPASS GRAFT  02/2006   4 vessel.  [facility]   ESOPHAGOGASTRODUODENOSCOPY (EGD) WITH PROPOFOL  N/A 12/04/2022   Procedure: ESOPHAGOGASTRODUODENOSCOPY (EGD) WITH PROPOFOL ;  Surgeon: Marnee Sink, MD;  Location: [facility] SURG[facility];  Service: Endoscopy;  Laterality: N/A;   POLYPECTOMY N/A 11/18/2020   Procedure: POLYPECTOMY;  Surgeon: Marnee Sink, MD;  Location: [facility] SURGERY [facility];  Service: Endoscopy;  Laterality: N/A;   VISCERAL ANGIOGRAPHY N/A 01/21/2023   Procedure: VISCERAL ANGIOGRAPHY;  Surgeon: Celso College, MD;  Location: [facility] INVASIVE CV LAB;  Service: Cardiovascular;  Laterality: N/A;    Allergies  Allergies  Allergen Reactions   Morphine  And Codeine Other (See Comments)    Headaches and creepy skin crawling (11/07/20 - pt currently taking without issues)   Propranolol Hives       History of Present Illness      68 y.o. y/o male with a history of CAD status post coronary artery bypass grafting, hypertension, hyperlipidemia, frequent PVCs, mild peripheral arterial disease, tobacco abuse, and chronic pain on narcotics.  He suffered a myocardial infarction in 2007 and required CABG x 3 at [facility].  He underwent repeat diagnostic catheterization in 2016 which showed severe native multivessel disease with 3 of 3 patent grafts.  There was a 50 to 60% stenosis in the  ostial RIMA to the RPDA.  In 2021, he was hospitalized at [facility] with blood in his stool and was suspected of having ischemic colitis.  He was hypotensive on presentation but responded to IV fluids.  He subsequently reported quitting alcohol.  Patient was seen in May 2025, reestablishing care after 2 years.  He reported worsening exertional dyspnea without chest pain.  He continued to smoke.  He was also noted to have ventricular bigeminy on ECG.  A ZIO monitor was obtained and showed an 8.1% PVC burden with 2 brief runs of SVT.  He was seen in  the emergency department on Feb 17, 2024 after drinking a pint of liquor and falling.  Head CT and CT of the C-spine were only remarkable for scalp hematoma.  Lab work was notable for potassium of 3.2.  He was subsequently discharged home.  Considering findings on monitoring and alcoholism, he was advised to start taking magnesium  oxide 400 mg daily.  Due to prior symptoms of dyspnea, echocardiogram was obtained early 03/2024, showing an EF of 30 to 35% with global hypokinesis, grade 1 diastolic dysfunction, mildly reduced RV function, mild MR, and aortic sclerosis without stenosis.   Today, [name] is accompanied by his wife.  He says that he feels very tired and fatigued over several years.  In discussing his symptoms further, he says that over the past 1 to 2 years, he has been having progressive exertional angina associated with dyspnea, lasting about 5 minutes, resolving with rest.  Symptoms are typically triggered by walking up hills or steps.  He does not have any resting angina.  In reference to his ED visit, he says that he has not had any alcohol since and that he is not sure why he had a backslide that day but does not expect to be using alcohol in the future.  He continues to smoke 1 to 1.5 packs of cigarettes per day.  He denies palpitations, PND, orthopnea, dizziness, syncope, edema, or early satiety. Objective   Home Medications    Current Outpatient Medications  Medication Sig Dispense Refill   albuterol  (VENTOLIN  HFA) 108 (90 Base) MCG/ACT inhaler Inhale 2 puffs into the lungs every 6 (six) hours as needed for wheezing or shortness of breath. 8 g 2   alprazolam  (XANAX ) 2 MG tablet TAKE 1 TABLET(2 MG) BY MOUTH THREE TIMES DAILY AS NEEDED FOR ANXIETY 90 tablet 2   Ascorbic Acid (VITAMIN C) 1000 MG tablet Take 1,000 mg by mouth daily.     carvedilol  (COREG ) 25 MG tablet Take 1 tablet (25 mg total) by mouth 2 (two) times daily with a meal. 180 tablet 3   clopidogrel  (PLAVIX ) 75 MG tablet  Take 1 tablet (75 mg total) by mouth daily. 90 tablet 1   cyclobenzaprine (FLEXERIL) 10 MG tablet Take 10 mg by mouth 3 (three) times daily as needed for muscle spasms.     escitalopram  (LEXAPRO ) 20 MG tablet Take 1 tablet (20 mg total) by mouth daily. 90 tablet 1   magnesium  oxide (MAG-OX) 400 MG tablet Take 1 tablet (400 mg total) by mouth daily.     Melatonin 10 MG TABS Take by mouth at bedtime.     Multiple Vitamins-Minerals (CENTRUM SILVER 50+MEN) TABS daily.     nitroGLYCERIN (NITROSTAT) 0.4 MG SL tablet Place 1 tablet (0.4 mg total) under the tongue every 5 (five) minutes as needed for chest pain. 25 tablet 1   oxyCODONE-acetaminophen  (PERCOCET) 10-325 MG per tablet Take 1 tablet  by mouth every 6 (six) hours as needed for pain.     rosuvastatin (CRESTOR) 40 MG tablet TAKE 1 TABLET BY MOUTH EVERY DAY AT BEDTIME 100 tablet 2   sacubitril-valsartan (ENTRESTO) 49-51 MG Take 1 tablet by mouth 2 (two) times daily. 60 tablet 1   tamsulosin  (FLOMAX ) 0.4 MG CAPS capsule Take 1 capsule (0.4 mg total) by mouth daily. 90 capsule 1   amoxicillin -clavulanate (AUGMENTIN ) 875-125 MG tablet Take 1 tablet by mouth 2 (two) times daily. (Patient not taking: Reported on 03/31/2024) 20 tablet 0   Cholecalciferol (VITAMIN D ) 50 MCG (2000 UT) CAPS Take by mouth daily. (Patient not taking: Reported on 03/31/2024)     pantoprazole  (PROTONIX ) 40 MG tablet TAKE 1 TABLET(40 MG) BY MOUTH DAILY (Patient not taking: Reported on 03/31/2024) 30 tablet 2   Testosterone  Enanthate (XYOSTED ) 75 MG/0.5ML SOAJ Inject 75 mg into the skin once a week. (Patient not taking: Reported on 03/31/2024) 2 mL 1   Current Facility-Administered Medications  Medication Dose Route Frequency Provider Last Rate Last Admin   albuterol  (VENTOLIN  HFA) 108 (90 Base) MCG/ACT inhaler 2 puff  2 puff Inhalation Once         Family history    Family History  Problem Relation Age of Onset   Heart attack Mother    Heart attack Father    Heart attack  Sister    Heart attack Brother    Heart attack Brother    Migraines Daughter    Hyperlipidemia Daughter    Anxiety disorder Daughter     Social history    Social History   Socioeconomic History   Marital status: Married    Spouse name: Not on file   Number of children: Not on file   Years of education: Not on file   Highest education level: Not on file  Occupational History   Not on file  Tobacco Use   Smoking status: Every Day    Current packs/day: 0.00    Average packs/day: 0.5 packs/day for 45.0 years (22.5 ttl pk-yrs)    Types: Cigarettes    Start date: 07/10/1975    Last attempt to quit: 07/09/2020    Years since quitting: 3.7   Smokeless tobacco: Never  Vaping Use   Vaping status: Every Day   Start date: 07/12/2020   Substances: Nicotine-salt  Substance and Sexual Activity   Alcohol use: No   Drug use: No   Sexual activity: Not on file  Other Topics Concern   Not on file  Social History Narrative   Not on file   Social Drivers of Health   Financial Resource Strain: Not on file  Food Insecurity: Not on file  Transportation Needs: Not on file  Physical Activity: Not on file  Stress: Not on file  Social Connections: Not on file  Intimate Partner Violence: Not on file     Review of systems    +++ Exertional angina and dyspnea. +++ Chronic fatigue.  He denies palpitations, PND, orthopnea, dizziness, syncope, edema, early satiety, nausea, vomiting, bright red blood per rectum, or melena.  All other systems reviewed and negative.  Physical Exam    VS:  BP (!) 150/80 (BP Location: Left Arm, Patient Position: Sitting, Cuff Size: Normal) Comment: he did not take his meds this am  Pulse 68   Ht 6' (1.829 m)   Wt 157 lb 9.6 oz (71.5 kg)   SpO2 98%   BMI 21.37 kg/m  , BMI Body mass index is  21.37 kg/m.    Vitals:   03/31/24 0931 03/31/24 1249  BP: (!) 150/80 (!) 146/80  Pulse: 68   SpO2: 98%           GEN: Well nourished, well developed, in no acute  distress. HEENT: normal. Neck: Supple, no JVD, carotid bruits, or masses. Cardiac: Irregular with occasional ectopy, distant, no murmurs, rubs, or gallops. No clubbing, cyanosis, edema.  Radials 2+/PT 2+ and equal bilaterally.  Respiratory:  Respirations regular and unlabored, diminished breath sounds. GI: Soft, nontender, nondistended, BS + x 4. MS: no deformity or atrophy. Skin: warm and dry, no rash. Neuro:  Strength and sensation are intact. Psych: Normal affect.  Accessory Clinical Findings    ECG personally reviewed by me today - EKG Interpretation Date/Time:  Friday March 31 2024 09:36:48 EDT Ventricular Rate:  68 PR Interval:  170 QRS Duration:  110 QT Interval:  414 QTC Calculation: 440 R Axis:   73  Text Interpretation: Sinus rhythm with occasional Premature ventricular complexes borderline left atrial enlargement Confirmed by Laneta Pintos [phone]) on 03/31/2024 9:57:38 AM   - no acute changes.  Lab Results  Component Value Date   WBC 7.0 02/17/2024   HGB 14.7 02/17/2024   HCT 42.9 02/17/2024   MCV 95.5 02/17/2024   PLT 200 02/17/2024   Lab Results  Component Value Date   CREATININE 0.58 (L) 02/17/2024   BUN <5 (L) 02/17/2024   NA 136 02/17/2024   K 3.2 (L) 02/17/2024   CL 101 02/17/2024   CO2 25 02/17/2024   Lab Results  Component Value Date   ALT 15 02/17/2024   AST 23 02/17/2024   ALKPHOS 41 02/17/2024   BILITOT 0.7 02/17/2024   Lab Results  Component Value Date   CHOL 120 09/13/2023   HDL 49 09/13/2023   LDLCALC 56 09/13/2023   TRIG 75 09/13/2023   CHOLHDL 2.4 09/13/2023    Lab Results  Component Value Date   HGBA1C 5.5 09/13/2023   Lab Results  Component Value Date   TSH 2.920 09/13/2023       Assessment & Plan    1.  CAD/unstable angina: Prior history of CAD status post CABG x 3 in 2007 with patent grafts on catheterization in 2016.  Over the past few years, he has had progressive exertional angina and dyspnea with recent finding  of 8.1% PVC burden and reduced EF on echocardiogram-30-35%.  In light of symptoms and LV dysfunction, we discussed pursuing diagnostic catheterization today.  And patient wishes to proceed.  I am adding aspirin and sublingual nitroglycerin to his regimen.  Continue beta-blocker and statin therapy.  Informed Consent   Shared Decision Making/Informed Consent The risks, including but not limited to, [bleeding or vascular complications (1 in 500), pneumothorax (1 in 1600), arrhythmia (1 in 1000) and death (1 in 5000)], benefits (diagnostic support and/or management of heart failure, pulmonary hypertension) and alternatives of a right heart catheterization were discussed in detail with Mr. Schar and he is willing to proceed. The risks [stroke (1 in 1000), death (1 in 1000), kidney failure [usually temporary] (1 in 500), bleeding (1 in 200), allergic reaction [possibly serious] (1 in 200)], benefits (diagnostic support and management of coronary artery disease) and alternatives of a cardiac catheterization were discussed in detail with Mr. Rockhold and he is willing to proceed.      2.  Ischemic cardiomyopathy/heart failure with reduced ejection fraction: Recent progression of dyspnea and 8.1% PVC burden on monitoring prompting echo  which showed reduced EF of 30-35% with global hypokinesis, grade 1 diastolic dysfunction, mildly reduced RV function, mild MR, and aortic sclerosis.  He continues to experience dyspnea and chest discomfort as outlined above.  He is euvolemic on examination.  He has only been taking carvedilol  once a day which he will start taking twice a day.  Discontinue lisinopril and switching him to Entresto 49-51 mg twice daily, which he will start taking tomorrow (last dose of lisinopril was yesterday).  Plan for right left heart cath as outlined above.  Post cath, will look to add MRA and SGLT2 inhibitor.  3.  Primary hypertension: Blood pressure elevated today at 150/80.  Patient had only been  taking carvedilol  once daily and will start taking it twice daily.  In addition, discontinuing lisinopril and he will start Entresto tomorrow (last dose of lisinopril was yesterday).  4.  Hyperlipidemia: Continue statin therapy.  LDL was 56 late last year.  5.  PVCs: Recently noted on ECG prompting echo which showed reduced EF.  Further ischemic evaluation as outlined above.  He remains on carvedilol .  He notes that he was only taking it once a day but will start taking it twice a day.  6.  Ongoing tobacco abuse: Still smoking 1 to 1-1/2 packs/day.  We discussed options for cessation and he will likely use a nicotine patch with nicotine gum as needed.  He is likely a poor candidate for Chantix in the setting of prior history of PTSD.  7.  Alcohol use: Recently drank heavily with resultant fall and ED visit.  He says he has not had any alcohol since and is not sure why he backslid that day, but thinks it may be related to his PTSD.  He does not plan on drinking again.  Complete cessation advised.  8.  Disposition: Follow-up lab work today with plan for right and left heart cardiac catheterization next week.  Follow-up in clinic 2 weeks post catheterization.  Laneta Pintos, NP 03/31/2024, 12:46 PM

## 2024-03-31 NOTE — Patient Instructions (Addendum)
 Medication Instructions:  STOP the Lisinopril  START Entresto 49-51 mg twice daily  Take Nitroglycerin as needed for chest pain: Place one tablet under the tongue as needed every 5 minutes for chest pain. If you have to use three tablets with no relief, please call 911 or go to your closest Emergency Department.  *If you need a refill on your cardiac medications before your next appointment, please call your pharmacy*  Lab Work: Your provider would like for you to have the following labs today: CBC, BMET, and Magnesium    If you have labs (blood work) drawn today and your tests are completely normal, you will receive your results only by: MyChart Message (if you have MyChart) OR A paper copy in the mail If you have any lab test that is abnormal or we need to change your treatment, we will call you to review the results.   Follow-Up: At [facility], you and your health needs are our priority.  As part of our continuing mission to provide you with exceptional heart care, our providers are all part of one team.  This team includes your primary Cardiologist (physician) and Advanced Practice Providers or APPs (Physician Assistants and Nurse Practitioners) who all work together to provide you with the care you need, when you need it.  Your next appointment:   3 week(s)  Provider:   Antionette Kirks, MD or Joel Bene, NP  We recommend signing up for the patient portal called MyChart.  Sign up information is provided on this After Visit Summary.  MyChart is used to connect with patients for Virtual Visits (Telemedicine).  Patients are able to view lab/test results, encounter notes, upcoming appointments, etc.  Non-urgent messages can be sent to your provider as well.   To learn more about what you can do with MyChart, go to [email or web address].   Other Instructions  [facility] A DEPT OF [name] [address] [place], [address] [place] [address] Dept: [phone] Loc: [phone]  [facility]  03/31/2024  You are scheduled for a Cardiac Catheterization on Monday, June 30 with Dr. Antionette Howe.  1. Please arrive at the [facility] Entrance of [facility], [address], [place] [address] at 8:30 AM (This is 1 hour(s) prior to your procedure time).  Proceed to the Check-In Desk directly inside the entrance.  Procedure Parking: Use the entrance off of the [address] side of the hospital. Turn right upon entering and follow the driveway to parking that is directly in front of the [facility] Center. There is no valet parking available at this entrance, however there is an awning directly in front of the [facility] Vascular Center for drop off/ pick up for patients.  Special note: Every effort is made to have your procedure done on time. Please understand that emergencies sometimes delay scheduled procedures.  2. Diet: Do not eat solid foods after midnight.  The patient may have clear liquids until 5am upon the day of the procedure.  3. Labs: You will need to have blood drawn on 03/31/24.  4. Medication instructions in preparation for your procedure: Nothing to hold  On the morning of your procedure, take your Aspirin 81 mg and Plavix /Clopidogrel  and any morning medicines NOT listed above.  You may use sips of water .  5. Plan to go home the same day, you will only stay overnight if medically necessary. 6. Bring a  current list of your medications and current insurance cards. 7. You MUST have a responsible person to drive you home. 8. Someone MUST be with you the first 24 hours after you arrive home or your discharge will be delayed. 9. Please wear clothes that are easy to get on and off and wear slip-on shoes.  Thank you for allowing us  to care for you!   -- [facility]

## 2024-03-31 NOTE — H&P (View-Only) (Signed)
 Office Visit    Patient Name: Joel Howe Date of Encounter: 03/31/2024  Primary Care Provider:  Trenda Frisk, FNP Primary Cardiologist:  Antionette Kirks, MD  Chief Complaint    68 y.o. male with a history of CAD status post coronary artery bypass grafting, hypertension, hyperlipidemia, frequent PVCs, mild peripheral arterial disease, tobacco abuse, and chronic pain on narcotics, who presents for follow-up after recent finding of cardiomyopathy.  Past Medical History   Subjective   Past Medical History:  Diagnosis Date   Anginal pain (HCC)    Anxiety    Arthritis    Chronic back pain    Coronary artery disease    a. 2007 CABG ([facility]): LIMA to LAD, SVG to OM and RIMA to right PDA; b. 04/2015 Cath: LM nl, LAD 14m, LCX ok, OM1 100, RCA ok, RPDA 80, Inf septal 70, LIMA->LAD ok, VG->OM1 ok, RIMA->RPDA 50-60ost-->med rx.   HFrEF (heart failure with reduced ejection fraction) (HCC)    a. 03/2024 Echo: EF 30-35%, glob HK, GrI DD, mildly reduced RV fxn, mild MR, AoV sclerosis.   Hyperlipidemia    Hyperlipidemia LDL goal <70    Hypertension    Ischemic cardiomyopathy    a. 03/2024 Echo: EF 30-35%.   Lyme disease    Multilevel degenerative disc disease    Myocardial infarction (HCC) 12/2005   PAD (peripheral artery disease) (HCC)    a. 01/2023 Peripheral Angio: <30% celiac stenosis, no SMA stenosis, patent IMA.   PVC's (premature ventricular contractions)    a. 02/2024 Zio: Predominantly sinus rhythm, average 86 (62-154).  2 SVT runs, fastest 154 x 9 beats, longest 8 beats at 120.  3.1% PAC burden.  8.1% PVC burden.   Tobacco use    Past Surgical History:  Procedure Laterality Date   CARDIAC CATHETERIZATION N/A 05/07/2015   Procedure: Left Heart Cath;  Surgeon: Cherrie Cornwall, MD;  Location: [facility] INVASIVE CV LAB;  Service: Cardiovascular;  Laterality: N/A;   COLONOSCOPY WITH PROPOFOL  N/A 11/18/2020   Procedure: COLONOSCOPY WITH BIOPSY;  Surgeon: Marnee Sink, MD;  Location: [facility]  SURGERY CNTR;  Service: Endoscopy;  Laterality: N/A;   CORONARY ANGIOPLASTY     CORONARY ARTERY BYPASS GRAFT  02/2006   4 vessel.  [facility]   ESOPHAGOGASTRODUODENOSCOPY (EGD) WITH PROPOFOL  N/A 12/04/2022   Procedure: ESOPHAGOGASTRODUODENOSCOPY (EGD) WITH PROPOFOL ;  Surgeon: Marnee Sink, MD;  Location: [facility] SURGERY CNTR;  Service: Endoscopy;  Laterality: N/A;   POLYPECTOMY N/A 11/18/2020   Procedure: POLYPECTOMY;  Surgeon: Marnee Sink, MD;  Location: [facility] SURGERY CNTR;  Service: Endoscopy;  Laterality: N/A;   VISCERAL ANGIOGRAPHY N/A 01/21/2023   Procedure: VISCERAL ANGIOGRAPHY;  Surgeon: Celso College, MD;  Location: [facility] INVASIVE CV LAB;  Service: Cardiovascular;  Laterality: N/A;    Allergies  Allergies  Allergen Reactions   Morphine  And Codeine Other (See Comments)    Headaches and creepy skin crawling (11/07/20 - pt currently taking without issues)   Propranolol Hives       History of Present Illness      68 y.o. y/o male with a history of CAD status post coronary artery bypass grafting, hypertension, hyperlipidemia, frequent PVCs, mild peripheral arterial disease, tobacco abuse, and chronic pain on narcotics.  He suffered a myocardial infarction in 2007 and required CABG x 3 at [facility].  He underwent repeat diagnostic catheterization in 2016 which showed severe native multivessel disease with 3 of 3 patent grafts.  There was a 50 to 60% stenosis in the  ostial RIMA to the RPDA.  In 2021, he was hospitalized at [facility] with blood in his stool and was suspected of having ischemic colitis.  He was hypotensive on presentation but responded to IV fluids.  He subsequently reported quitting alcohol.  Patient was seen in May 2025, reestablishing care after 2 years.  He reported worsening exertional dyspnea without chest pain.  He continued to smoke.  He was also noted to have ventricular bigeminy on ECG.  A ZIO monitor was obtained and showed an 8.1% PVC burden with 2 brief runs of SVT.  He was seen in  the emergency department on Feb 17, 2024 after drinking a pint of liquor and falling.  Head CT and CT of the C-spine were only remarkable for scalp hematoma.  Lab work was notable for potassium of 3.2.  He was subsequently discharged home.  Considering findings on monitoring and alcoholism, he was advised to start taking magnesium  oxide 400 mg daily.  Due to prior symptoms of dyspnea, echocardiogram was obtained early 03/2024, showing an EF of 30 to 35% with global hypokinesis, grade 1 diastolic dysfunction, mildly reduced RV function, mild MR, and aortic sclerosis without stenosis.   Today, Joel Howe is accompanied by his wife.  He says that he feels very tired and fatigued over several years.  In discussing his symptoms further, he says that over the past 1 to 2 years, he has been having progressive exertional angina associated with dyspnea, lasting about 5 minutes, resolving with rest.  Symptoms are typically triggered by walking up hills or steps.  He does not have any resting angina.  In reference to his ED visit, he says that he has not had any alcohol since and that he is not sure why he had a backslide that day but does not expect to be using alcohol in the future.  He continues to smoke 1 to 1.5 packs of cigarettes per day.  He denies palpitations, PND, orthopnea, dizziness, syncope, edema, or early satiety. Objective   Home Medications    Current Outpatient Medications  Medication Sig Dispense Refill   albuterol  (VENTOLIN  HFA) 108 (90 Base) MCG/ACT inhaler Inhale 2 puffs into the lungs every 6 (six) hours as needed for wheezing or shortness of breath. 8 g 2   alprazolam  (XANAX ) 2 MG tablet TAKE 1 TABLET(2 MG) BY MOUTH THREE TIMES DAILY AS NEEDED FOR ANXIETY 90 tablet 2   Ascorbic Acid (VITAMIN C) 1000 MG tablet Take 1,000 mg by mouth daily.     carvedilol  (COREG ) 25 MG tablet Take 1 tablet (25 mg total) by mouth 2 (two) times daily with a meal. 180 tablet 3   clopidogrel  (PLAVIX ) 75 MG tablet  Take 1 tablet (75 mg total) by mouth daily. 90 tablet 1   cyclobenzaprine (FLEXERIL) 10 MG tablet Take 10 mg by mouth 3 (three) times daily as needed for muscle spasms.     escitalopram  (LEXAPRO ) 20 MG tablet Take 1 tablet (20 mg total) by mouth daily. 90 tablet 1   magnesium  oxide (MAG-OX) 400 MG tablet Take 1 tablet (400 mg total) by mouth daily.     Melatonin 10 MG TABS Take by mouth at bedtime.     Multiple Vitamins-Minerals (CENTRUM SILVER 50+MEN) TABS daily.     nitroGLYCERIN (NITROSTAT) 0.4 MG SL tablet Place 1 tablet (0.4 mg total) under the tongue every 5 (five) minutes as needed for chest pain. 25 tablet 1   oxyCODONE-acetaminophen  (PERCOCET) 10-325 MG per tablet Take 1 tablet  by mouth every 6 (six) hours as needed for pain.     rosuvastatin (CRESTOR) 40 MG tablet TAKE 1 TABLET BY MOUTH EVERY DAY AT BEDTIME 100 tablet 2   sacubitril-valsartan (ENTRESTO) 49-51 MG Take 1 tablet by mouth 2 (two) times daily. 60 tablet 1   tamsulosin  (FLOMAX ) 0.4 MG CAPS capsule Take 1 capsule (0.4 mg total) by mouth daily. 90 capsule 1   amoxicillin -clavulanate (AUGMENTIN ) 875-125 MG tablet Take 1 tablet by mouth 2 (two) times daily. (Patient not taking: Reported on 03/31/2024) 20 tablet 0   Cholecalciferol (VITAMIN D ) 50 MCG (2000 UT) CAPS Take by mouth daily. (Patient not taking: Reported on 03/31/2024)     pantoprazole  (PROTONIX ) 40 MG tablet TAKE 1 TABLET(40 MG) BY MOUTH DAILY (Patient not taking: Reported on 03/31/2024) 30 tablet 2   Testosterone  Enanthate (XYOSTED ) 75 MG/0.5ML SOAJ Inject 75 mg into the skin once a week. (Patient not taking: Reported on 03/31/2024) 2 mL 1   Current Facility-Administered Medications  Medication Dose Route Frequency Provider Last Rate Last Admin   albuterol  (VENTOLIN  HFA) 108 (90 Base) MCG/ACT inhaler 2 puff  2 puff Inhalation Once         Family history    Family History  Problem Relation Age of Onset   Heart attack Mother    Heart attack Father    Heart attack  Sister    Heart attack Brother    Heart attack Brother    Migraines Daughter    Hyperlipidemia Daughter    Anxiety disorder Daughter     Social history    Social History   Socioeconomic History   Marital status: Married    Spouse name: Not on file   Number of children: Not on file   Years of education: Not on file   Highest education level: Not on file  Occupational History   Not on file  Tobacco Use   Smoking status: Every Day    Current packs/day: 0.00    Average packs/day: 0.5 packs/day for 45.0 years (22.5 ttl pk-yrs)    Types: Cigarettes    Start date: 07/10/1975    Last attempt to quit: 07/09/2020    Years since quitting: 3.7   Smokeless tobacco: Never  Vaping Use   Vaping status: Every Day   Start date: 07/12/2020   Substances: Nicotine-salt  Substance and Sexual Activity   Alcohol use: No   Drug use: No   Sexual activity: Not on file  Other Topics Concern   Not on file  Social History Narrative   Not on file   Social Drivers of Health   Financial Resource Strain: Not on file  Food Insecurity: Not on file  Transportation Needs: Not on file  Physical Activity: Not on file  Stress: Not on file  Social Connections: Not on file  Intimate Partner Violence: Not on file     Review of systems    +++ Exertional angina and dyspnea. +++ Chronic fatigue.  He denies palpitations, PND, orthopnea, dizziness, syncope, edema, early satiety, nausea, vomiting, bright red blood per rectum, or melena.  All other systems reviewed and negative.  Physical Exam    VS:  BP (!) 150/80 (BP Location: Left Arm, Patient Position: Sitting, Cuff Size: Normal) Comment: he did not take his meds this am  Pulse 68   Ht 6' (1.829 m)   Wt 157 lb 9.6 oz (71.5 kg)   SpO2 98%   BMI 21.37 kg/m  , BMI Body mass index is  21.37 kg/m.    Vitals:   03/31/24 0931 03/31/24 1249  BP: (!) 150/80 (!) 146/80  Pulse: 68   SpO2: 98%           GEN: Well nourished, well developed, in no acute  distress. HEENT: normal. Neck: Supple, no JVD, carotid bruits, or masses. Cardiac: Irregular with occasional ectopy, distant, no murmurs, rubs, or gallops. No clubbing, cyanosis, edema.  Radials 2+/PT 2+ and equal bilaterally.  Respiratory:  Respirations regular and unlabored, diminished breath sounds. GI: Soft, nontender, nondistended, BS + x 4. MS: no deformity or atrophy. Skin: warm and dry, no rash. Neuro:  Strength and sensation are intact. Psych: Normal affect.  Accessory Clinical Findings    ECG personally reviewed by me today - EKG Interpretation Date/Time:  Friday March 31 2024 09:36:48 EDT Ventricular Rate:  68 PR Interval:  170 QRS Duration:  110 QT Interval:  414 QTC Calculation: 440 R Axis:   73  Text Interpretation: Sinus rhythm with occasional Premature ventricular complexes borderline left atrial enlargement Confirmed by Laneta Pintos [phone]) on 03/31/2024 9:57:38 AM   - no acute changes.  Lab Results  Component Value Date   WBC 7.0 02/17/2024   HGB 14.7 02/17/2024   HCT 42.9 02/17/2024   MCV 95.5 02/17/2024   PLT 200 02/17/2024   Lab Results  Component Value Date   CREATININE 0.58 (L) 02/17/2024   BUN <5 (L) 02/17/2024   NA 136 02/17/2024   K 3.2 (L) 02/17/2024   CL 101 02/17/2024   CO2 25 02/17/2024   Lab Results  Component Value Date   ALT 15 02/17/2024   AST 23 02/17/2024   ALKPHOS 41 02/17/2024   BILITOT 0.7 02/17/2024   Lab Results  Component Value Date   CHOL 120 09/13/2023   HDL 49 09/13/2023   LDLCALC 56 09/13/2023   TRIG 75 09/13/2023   CHOLHDL 2.4 09/13/2023    Lab Results  Component Value Date   HGBA1C 5.5 09/13/2023   Lab Results  Component Value Date   TSH 2.920 09/13/2023       Assessment & Plan    1.  CAD/unstable angina: Prior history of CAD status post CABG x 3 in 2007 with patent grafts on catheterization in 2016.  Over the past few years, he has had progressive exertional angina and dyspnea with recent finding  of 8.1% PVC burden and reduced EF on echocardiogram-30-35%.  In light of symptoms and LV dysfunction, we discussed pursuing diagnostic catheterization today.  And patient wishes to proceed.  I am adding aspirin and sublingual nitroglycerin to his regimen.  Continue beta-blocker and statin therapy.  Informed Consent   Shared Decision Making/Informed Consent The risks, including but not limited to, [bleeding or vascular complications (1 in 500), pneumothorax (1 in 1600), arrhythmia (1 in 1000) and death (1 in 5000)], benefits (diagnostic support and/or management of heart failure, pulmonary hypertension) and alternatives of a right heart catheterization were discussed in detail with Joel Howe and he is willing to proceed. The risks [stroke (1 in 1000), death (1 in 1000), kidney failure [usually temporary] (1 in 500), bleeding (1 in 200), allergic reaction [possibly serious] (1 in 200)], benefits (diagnostic support and management of coronary artery disease) and alternatives of a cardiac catheterization were discussed in detail with Joel Howe and he is willing to proceed.      2.  Ischemic cardiomyopathy/heart failure with reduced ejection fraction: Recent progression of dyspnea and 8.1% PVC burden on monitoring prompting echo  which showed reduced EF of 30-35% with global hypokinesis, grade 1 diastolic dysfunction, mildly reduced RV function, mild MR, and aortic sclerosis.  He continues to experience dyspnea and chest discomfort as outlined above.  He is euvolemic on examination.  He has only been taking carvedilol  once a day which he will start taking twice a day.  Discontinue lisinopril and switching him to Entresto 49-51 mg twice daily, which he will start taking tomorrow (last dose of lisinopril was yesterday).  Plan for right left heart cath as outlined above.  Post cath, will look to add MRA and SGLT2 inhibitor.  3.  Primary hypertension: Blood pressure elevated today at 150/80.  Patient had only been  taking carvedilol  once daily and will start taking it twice daily.  In addition, discontinuing lisinopril and he will start Entresto tomorrow (last dose of lisinopril was yesterday).  4.  Hyperlipidemia: Continue statin therapy.  LDL was 56 late last year.  5.  PVCs: Recently noted on ECG prompting echo which showed reduced EF.  Further ischemic evaluation as outlined above.  He remains on carvedilol .  He notes that he was only taking it once a day but will start taking it twice a day.  6.  Ongoing tobacco abuse: Still smoking 1 to 1-1/2 packs/day.  We discussed options for cessation and he will likely use a nicotine patch with nicotine gum as needed.  He is likely a poor candidate for Chantix in the setting of prior history of PTSD.  7.  Alcohol use: Recently drank heavily with resultant fall and ED visit.  He says he has not had any alcohol since and is not sure why he backslid that day, but thinks it may be related to his PTSD.  He does not plan on drinking again.  Complete cessation advised.  8.  Disposition: Follow-up lab work today with plan for right and left heart cardiac catheterization next week.  Follow-up in clinic 2 weeks post catheterization.  Laneta Pintos, NP 03/31/2024, 12:46 PM

## 2024-04-01 LAB — CBC
Hematocrit: 46 % (ref 37.5–51.0)
Hemoglobin: 15.3 g/dL (ref 13.0–17.7)
MCH: 33.7 pg — ABNORMAL HIGH (ref 26.6–33.0)
MCHC: 33.3 g/dL (ref 31.5–35.7)
MCV: 101 fL — ABNORMAL HIGH (ref 79–97)
Platelets: 173 10*3/uL (ref 150–450)
RBC: 4.54 x10E6/uL (ref 4.14–5.80)
RDW: 12.4 % (ref 11.6–15.4)
WBC: 8.3 10*3/uL (ref 3.4–10.8)

## 2024-04-01 LAB — BASIC METABOLIC PANEL WITH GFR
BUN/Creatinine Ratio: 14 (ref 10–24)
BUN: 12 mg/dL (ref 8–27)
CO2: 24 mmol/L (ref 20–29)
Calcium: 9.2 mg/dL (ref 8.6–10.2)
Chloride: 96 mmol/L (ref 96–106)
Creatinine, Ser: 0.83 mg/dL (ref 0.76–1.27)
Glucose: 89 mg/dL (ref 70–99)
Potassium: 5.4 mmol/L — ABNORMAL HIGH (ref 3.5–5.2)
Sodium: 137 mmol/L (ref 134–144)
eGFR: 97 mL/min/{1.73_m2} (ref >59)

## 2024-04-01 LAB — MAGNESIUM: Magnesium: 2.3 mg/dL (ref 1.6–2.3)

## 2024-04-03 ENCOUNTER — Ambulatory Visit: Payer: Self-pay | Admitting: Nurse Practitioner

## 2024-04-03 DIAGNOSIS — I10 Essential (primary) hypertension: Secondary | ICD-10-CM

## 2024-04-03 DIAGNOSIS — I25110 Atherosclerotic heart disease of native coronary artery with unstable angina pectoris: Secondary | ICD-10-CM

## 2024-04-05 ENCOUNTER — Other Ambulatory Visit
Admission: RE | Admit: 2024-04-05 | Discharge: 2024-04-05 | Disposition: A | Discharge status: Home / Self Care | Source: Ambulatory Visit | Attending: Nurse Practitioner | Admitting: Nurse Practitioner

## 2024-04-05 DIAGNOSIS — I25110 Atherosclerotic heart disease of native coronary artery with unstable angina pectoris: Secondary | ICD-10-CM | POA: Insufficient documentation

## 2024-04-05 DIAGNOSIS — I10 Essential (primary) hypertension: Secondary | ICD-10-CM | POA: Insufficient documentation

## 2024-04-05 LAB — BASIC METABOLIC PANEL WITH GFR
Anion gap: 6 (ref 5–15)
BUN: 16 mg/dL (ref 8–23)
CO2: 29 mmol/L (ref 22–32)
Calcium: 9.1 mg/dL (ref 8.9–10.3)
Chloride: 100 mmol/L (ref 98–111)
Creatinine, Ser: 0.74 mg/dL (ref 0.61–1.24)
GFR, Estimated: >60 mL/min (ref >60)
Glucose, Bld: 112 mg/dL — ABNORMAL HIGH (ref 70–99)
Potassium: 4.3 mmol/L (ref 3.5–5.1)
Sodium: 135 mmol/L (ref 135–145)

## 2024-04-06 ENCOUNTER — Ambulatory Visit: Payer: Self-pay | Admitting: Nurse Practitioner

## 2024-04-07 ENCOUNTER — Telehealth: Payer: Self-pay | Admitting: *Deleted

## 2024-04-07 NOTE — Telephone Encounter (Signed)
 Scheduled for: R/L heart cath Date: 04/10/24 Time: 09:30 am Arrival Time: 08:30 am Location: [facility] Instructions: Reviewed  Patient verbalized understanding of instructions, arrival time, and location with no further questions at this time.

## 2024-04-10 ENCOUNTER — Ambulatory Visit
Admission: RE | Admit: 2024-04-10 | Discharge: 2024-04-10 | Disposition: A | Discharge status: Home / Self Care | Attending: Cardiovascular Disease | Admitting: Cardiovascular Disease

## 2024-04-10 ENCOUNTER — Encounter: Payer: Self-pay | Admitting: Cardiovascular Disease

## 2024-04-10 ENCOUNTER — Other Ambulatory Visit: Payer: Self-pay

## 2024-04-10 ENCOUNTER — Encounter
Admission: RE | Disposition: A | Discharge status: Home / Self Care | Payer: Self-pay | Source: Home / Self Care | Attending: Cardiovascular Disease

## 2024-04-10 DIAGNOSIS — I493 Ventricular premature depolarization: Secondary | ICD-10-CM | POA: Diagnosis not present

## 2024-04-10 DIAGNOSIS — I2510 Atherosclerotic heart disease of native coronary artery without angina pectoris: Secondary | ICD-10-CM | POA: Diagnosis not present

## 2024-04-10 DIAGNOSIS — I272 Pulmonary hypertension, unspecified: Secondary | ICD-10-CM | POA: Insufficient documentation

## 2024-04-10 DIAGNOSIS — I25110 Atherosclerotic heart disease of native coronary artery with unstable angina pectoris: Secondary | ICD-10-CM | POA: Diagnosis present

## 2024-04-10 DIAGNOSIS — F17290 Nicotine dependence, other tobacco product, uncomplicated: Secondary | ICD-10-CM | POA: Insufficient documentation

## 2024-04-10 DIAGNOSIS — F17210 Nicotine dependence, cigarettes, uncomplicated: Secondary | ICD-10-CM | POA: Insufficient documentation

## 2024-04-10 DIAGNOSIS — I110 Hypertensive heart disease with heart failure: Secondary | ICD-10-CM | POA: Insufficient documentation

## 2024-04-10 DIAGNOSIS — E785 Hyperlipidemia, unspecified: Secondary | ICD-10-CM | POA: Insufficient documentation

## 2024-04-10 DIAGNOSIS — I255 Ischemic cardiomyopathy: Secondary | ICD-10-CM | POA: Diagnosis not present

## 2024-04-10 DIAGNOSIS — R079 Chest pain, unspecified: Secondary | ICD-10-CM | POA: Diagnosis present

## 2024-04-10 DIAGNOSIS — I5022 Chronic systolic (congestive) heart failure: Secondary | ICD-10-CM | POA: Diagnosis not present

## 2024-04-10 HISTORY — PX: CORONARY STENT INTERVENTION: CATH118234

## 2024-04-10 HISTORY — PX: RIGHT/LEFT HEART CATH AND CORONARY ANGIOGRAPHY: CATH118266

## 2024-04-10 LAB — POCT I-STAT 7, (LYTES, BLD GAS, ICA,H+H)
Acid-base deficit: 1 mmol/L (ref 0.0–2.0)
Bicarbonate: 24.5 mmol/L (ref 20.0–28.0)
Calcium, Ion: 1.2 mmol/L (ref 1.15–1.40)
HCT: 45 % (ref 39.0–52.0)
Hemoglobin: 15.3 g/dL (ref 13.0–17.0)
O2 Saturation: 95 %
Potassium: 4 mmol/L (ref 3.5–5.1)
Sodium: 139 mmol/L (ref 135–145)
TCO2: 26 mmol/L (ref 22–32)
pCO2 arterial: 42.3 mmHg (ref 32–48)
pH, Arterial: 7.37 (ref 7.35–7.45)
pO2, Arterial: 79 mmHg — ABNORMAL LOW (ref 83–108)

## 2024-04-10 LAB — POCT I-STAT EG7
Acid-Base Excess: 0 mmol/L (ref 0.0–2.0)
Bicarbonate: 26.9 mmol/L (ref 20.0–28.0)
Calcium, Ion: 1.24 mmol/L (ref 1.15–1.40)
HCT: 45 % (ref 39.0–52.0)
Hemoglobin: 15.3 g/dL (ref 13.0–17.0)
O2 Saturation: 62 %
Potassium: 4 mmol/L (ref 3.5–5.1)
Sodium: 139 mmol/L (ref 135–145)
TCO2: 28 mmol/L (ref 22–32)
pCO2, Ven: 49.8 mmHg (ref 44–60)
pH, Ven: 7.341 (ref 7.25–7.43)
pO2, Ven: 34 mmHg (ref 32–45)

## 2024-04-10 LAB — POCT ACTIVATED CLOTTING TIME: Activated Clotting Time: 412 s

## 2024-04-10 SURGERY — RIGHT/LEFT HEART CATH AND CORONARY ANGIOGRAPHY
Anesthesia: Moderate Sedation

## 2024-04-10 MED ORDER — SODIUM CHLORIDE 0.9% FLUSH: 3.0000 mL | Freq: Two times a day (BID) | INTRAVENOUS | Status: DC

## 2024-04-10 MED ORDER — SACUBITRIL-VALSARTAN 49-51 MG PO TABS: 1.0000 | ORAL_TABLET | Freq: Two times a day (BID) | ORAL | Status: DC

## 2024-04-10 MED ORDER — OXYCODONE-ACETAMINOPHEN 5-325 MG PO TABS
1.0000 | ORAL_TABLET | Freq: Four times a day (QID) | ORAL | Status: DC | PRN
  Administered 2024-04-10: 1 via ORAL

## 2024-04-10 MED ORDER — ACETAMINOPHEN 325 MG PO TABS: 650.0000 mg | ORAL_TABLET | ORAL | Status: DC | PRN

## 2024-04-10 MED ORDER — CARVEDILOL 25 MG PO TABS: 25.0000 mg | ORAL_TABLET | Freq: Two times a day (BID) | ORAL | Status: DC

## 2024-04-10 MED ORDER — OXYCODONE HCL 5 MG PO TABS
ORAL_TABLET | ORAL | Status: AC
  Filled 2024-04-10: qty 1

## 2024-04-10 MED ORDER — SODIUM CHLORIDE 0.9 % IV SOLN: INTRAVENOUS | Status: DC

## 2024-04-10 MED ORDER — LIDOCAINE HCL 1 % IJ SOLN
INTRAMUSCULAR | Status: AC
  Filled 2024-04-10: qty 20

## 2024-04-10 MED ORDER — MIDAZOLAM HCL 2 MG/2ML IJ SOLN
INTRAMUSCULAR | Status: DC | PRN
  Administered 2024-04-10: 1 mg via INTRAVENOUS
  Administered 2024-04-10: 1 mg

## 2024-04-10 MED ORDER — MIDAZOLAM HCL 2 MG/2ML IJ SOLN
INTRAMUSCULAR | Status: AC
Start: 2024-04-10 — End: 2024-04-10
  Filled 2024-04-10: qty 2

## 2024-04-10 MED ORDER — HEPARIN (PORCINE) IN NACL 1000-0.9 UT/500ML-% IV SOLN
INTRAVENOUS | Status: AC
  Filled 2024-04-10: qty 1000

## 2024-04-10 MED ORDER — CLOPIDOGREL BISULFATE 75 MG PO TABS
ORAL_TABLET | ORAL | Status: AC
Start: 2024-04-10 — End: 2024-04-10
  Filled 2024-04-10: qty 4

## 2024-04-10 MED ORDER — OXYCODONE-ACETAMINOPHEN 10-325 MG PO TABS: 1.0000 | ORAL_TABLET | Freq: Four times a day (QID) | ORAL | Status: DC | PRN

## 2024-04-10 MED ORDER — FENTANYL CITRATE (PF) 100 MCG/2ML IJ SOLN
INTRAMUSCULAR | Status: DC | PRN
  Administered 2024-04-10: 25 ug via INTRAVENOUS
  Administered 2024-04-10: 50 ug
  Administered 2024-04-10: 25 ug

## 2024-04-10 MED ORDER — CLOPIDOGREL BISULFATE 75 MG PO TABS
ORAL_TABLET | ORAL | Status: DC | PRN
  Administered 2024-04-10: 300 mg via ORAL

## 2024-04-10 MED ORDER — HEPARIN SODIUM (PORCINE) 1000 UNIT/ML IJ SOLN
INTRAMUSCULAR | Status: AC
  Filled 2024-04-10: qty 10

## 2024-04-10 MED ORDER — FENTANYL CITRATE (PF) 100 MCG/2ML IJ SOLN
INTRAMUSCULAR | Status: AC
  Filled 2024-04-10: qty 2

## 2024-04-10 MED ORDER — OXYCODONE HCL 5 MG PO TABS
5.0000 mg | ORAL_TABLET | Freq: Four times a day (QID) | ORAL | Status: DC | PRN
  Administered 2024-04-10: 5 mg via ORAL

## 2024-04-10 MED ORDER — CYCLOBENZAPRINE HCL 10 MG PO TABS: 10.0000 mg | ORAL_TABLET | Freq: Three times a day (TID) | ORAL | Status: DC | PRN

## 2024-04-10 MED ORDER — IOHEXOL 300 MG/ML  SOLN
INTRAMUSCULAR | Status: DC | PRN
  Administered 2024-04-10: 124 mL

## 2024-04-10 MED ORDER — VERAPAMIL HCL 2.5 MG/ML IV SOLN
INTRAVENOUS | Status: DC | PRN
  Administered 2024-04-10: 2.5 mg via INTRAVENOUS

## 2024-04-10 MED ORDER — HEPARIN SODIUM (PORCINE) 1000 UNIT/ML IJ SOLN
INTRAMUSCULAR | Status: DC | PRN
  Administered 2024-04-10: 3500 [IU] via INTRAVENOUS
  Administered 2024-04-10: 5000 [IU] via INTRAVENOUS

## 2024-04-10 MED ORDER — LIDOCAINE HCL (PF) 1 % IJ SOLN
INTRAMUSCULAR | Status: DC | PRN
  Administered 2024-04-10: 2 mL

## 2024-04-10 MED ORDER — ALBUTEROL SULFATE HFA 108 (90 BASE) MCG/ACT IN AERS: 2.0000 | INHALATION_SPRAY | Freq: Four times a day (QID) | RESPIRATORY_TRACT | Status: DC | PRN

## 2024-04-10 MED ORDER — ASPIRIN 81 MG PO TBEC: 81.0000 mg | DELAYED_RELEASE_TABLET | Freq: Every day | ORAL | Status: DC

## 2024-04-10 MED ORDER — SODIUM CHLORIDE 0.9% FLUSH: 3.0000 mL | INTRAVENOUS | Status: DC | PRN

## 2024-04-10 MED ORDER — ASPIRIN 81 MG PO CHEW: 81.0000 mg | CHEWABLE_TABLET | ORAL | Status: DC

## 2024-04-10 MED ORDER — ROSUVASTATIN CALCIUM 40 MG PO TABS: 40.0000 mg | ORAL_TABLET | Freq: Every day | ORAL | Status: DC

## 2024-04-10 MED ORDER — ASCORBIC ACID 500 MG PO TABS: 500.0000 mg | ORAL_TABLET | Freq: Every day | ORAL | Status: DC

## 2024-04-10 MED ORDER — ONDANSETRON HCL 4 MG/2ML IJ SOLN: 4.0000 mg | Freq: Four times a day (QID) | INTRAMUSCULAR | Status: DC | PRN

## 2024-04-10 MED ORDER — TAMSULOSIN HCL 0.4 MG PO CAPS: 0.4000 mg | ORAL_CAPSULE | Freq: Every day | ORAL | Status: DC

## 2024-04-10 MED ORDER — NITROGLYCERIN 0.4 MG SL SUBL: 0.4000 mg | SUBLINGUAL_TABLET | SUBLINGUAL | Status: DC | PRN

## 2024-04-10 MED ORDER — CENTRUM SILVER 50+MEN PO TABS: 1.0000 | ORAL_TABLET | Freq: Every day | ORAL | Status: DC

## 2024-04-10 MED ORDER — OXYCODONE-ACETAMINOPHEN 5-325 MG PO TABS
ORAL_TABLET | ORAL | Status: AC
  Filled 2024-04-10: qty 1

## 2024-04-10 MED ORDER — MELATONIN 10 MG PO TABS: 10.0000 mg | ORAL_TABLET | Freq: Every day | ORAL | Status: DC

## 2024-04-10 MED ORDER — CLOPIDOGREL BISULFATE 75 MG PO TABS: 75.0000 mg | ORAL_TABLET | Freq: Every day | ORAL | Status: DC

## 2024-04-10 MED ORDER — SODIUM CHLORIDE 0.9 % IV SOLN
250.0000 mL | INTRAVENOUS | Status: DC | PRN
Start: 2024-04-10 — End: 2024-04-10

## 2024-04-10 MED ORDER — HEPARIN (PORCINE) IN NACL 2000-0.9 UNIT/L-% IV SOLN
INTRAVENOUS | Status: DC | PRN
  Administered 2024-04-10: 1000 mL

## 2024-04-10 MED ORDER — VERAPAMIL HCL 2.5 MG/ML IV SOLN
INTRAVENOUS | Status: AC
  Filled 2024-04-10: qty 2

## 2024-04-10 MED ORDER — ESCITALOPRAM OXALATE 20 MG PO TABS: 20.0000 mg | ORAL_TABLET | Freq: Every day | ORAL | Status: DC

## 2024-04-10 MED ORDER — MAGNESIUM OXIDE 400 MG PO TABS: 400.0000 mg | ORAL_TABLET | Freq: Every day | ORAL | Status: DC

## 2024-04-10 SURGICAL SUPPLY — 18 items
BALLOON ~~LOC~~ TREK NEO RX 4.0X12 (BALLOONS) IMPLANT
CATH BALLN WEDGE 5F 110CM (CATHETERS) IMPLANT
CATH INFINITI 5 FR MPA2 (CATHETERS) IMPLANT
CATH INFINITI AMBI 5FR TG (CATHETERS) IMPLANT
CATH VISTA GUIDE 6FR MPA1 MLPK (CATHETERS) IMPLANT
DEVICE RAD TR BAND REGULAR (VASCULAR PRODUCTS) IMPLANT
DRAPE BRACHIAL (DRAPES) IMPLANT
GLIDESHEATH SLEND SS 6F .021 (SHEATH) IMPLANT
GUIDEWIRE INQWIRE 1.5J.035X260 (WIRE) IMPLANT
KIT ENCORE 26 ADVANTAGE (KITS) IMPLANT
KIT SYRINGE INJ CVI SPIKEX1 (MISCELLANEOUS) IMPLANT
PACK CARDIAC CATH (CUSTOM PROCEDURE TRAY) ×2 IMPLANT
SET ATX-X65L (MISCELLANEOUS) IMPLANT
SHEATH GLIDE SLENDER 4/5FR (SHEATH) IMPLANT
STATION PROTECTION PRESSURIZED (MISCELLANEOUS) IMPLANT
STENT ONYX FRONTIER 3.5X15 (Permanent Stent) IMPLANT
TUBING CIL FLEX 10 FLL-RA (TUBING) IMPLANT
WIRE RUNTHROUGH .014X180CM (WIRE) IMPLANT

## 2024-04-10 NOTE — Interval H&P Note (Signed)
 History and Physical Interval Note:  04/10/2024 9:43 AM  Joel Howe  has presented today for surgery, with the diagnosis of L and R Cath    CP on exertion   HF.  The various methods of treatment have been discussed with the patient and family. After consideration of risks, benefits and other options for treatment, the patient has consented to  Procedure(s): RIGHT/LEFT HEART CATH AND CORONARY ANGIOGRAPHY (Bilateral) as a surgical intervention.  The patient's history has been reviewed, patient examined, no change in status, stable for surgery.  I have reviewed the patient's chart and labs.  Questions were answered to the patient's satisfaction.     [name]

## 2024-04-10 NOTE — Discharge Summary (Signed)
 Discharge Summary   [name] MRN: [id number]; DOB: [birth year]  Admit date: 04/10/2024 Discharge date: 04/10/2024  PCP:  Orlean Alan HERO, FNP   [facility] Providers Cardiologist:  Deatrice Cage, MD       Discharge Diagnoses  Active Problems:   Coronary artery disease involving native coronary artery of native heart with unstable angina pectoris [facility])   Chronic systolic heart failure [facility])  Diagnostic Studies/Procedures   04/10/2024 LHC 1.  Significant underlying three-vessel coronary artery disease with patent grafts including LIMA to LAD, SVG to OM 2 and SVG or free RIMA to right PDA.  Significant progression of ostial disease affecting the graft to the right PDA. 2.  Left ventricular angiography was not performed.  EF was 30 to 35% by echo. 3.  Right heart catheterization showed high normal filling pressures, minimal pulmonary hypertension and moderately reduced cardiac output. 4.  Successful drug-eluting stent placement to the ostial graft to right PDA     _____________   History of Present Illness   Joel Howe is a 68 y.o. male with history of CAD s/p CABG, hypertension, hyperlipidemia, frequent PVCs, mild peripheral arterial disease, tobacco abuse, and chronic pain on narcotics who presented for outpatient cardiac catheterization with possible percutaneous intervention. LHC revealed significant underlying three vessel CAD with patent grafts including LIMA to LAD, SVG to SVG or free RIMA to right PDA.  There was significant progression of ostial disease affecting the graft to the right PDA which was treated successfully with drug-eluting stent placement.  Right heart catheterization showed high normal filling pressures, minimal pulmonary hypertension, and moderately reduced cardiac output.  Volume status appears optimal.  No medication changes indicated.  Patient is continued on DAPT with aspirin 81 mg daily and clopidogrel  75 mg daily.    Did the  patient have an acute coronary syndrome (MI, NSTEMI, STEMI, etc) this admission?:  No                               Did the patient have a percutaneous coronary intervention (stent / angioplasty)?:  Yes.    Cath/PCI Registry Performance & Quality Measures: Aspirin prescribed? - Yes ADP Receptor Inhibitor (Plavix /Clopidogrel , Brilinta/Ticagrelor or Effient/Prasugrel) prescribed (includes medically managed patients)? - Yes High Intensity Statin (Lipitor 40-80mg  or Crestor 20-40mg ) prescribed? - Yes For EF <40%, was ACEI/ARB prescribed? - Yes For EF <40%, Aldosterone Antagonist (Spironolactone or Eplerenone) prescribed? - No - Reason:  Consider as OP Cardiac Rehab Phase II ordered? - Yes  ___________  Discharge Vitals Blood pressure 134/76, pulse 78, temperature 98.5 F (36.9 C), temperature source Temporal, resp. rate 13, height 6' (1.829 m), weight 68.8 kg, SpO2 96%.  Filed Weights   04/10/24 0840  Weight: 68.8 kg   Physical Exam  Constitutional: No distress.  Neck: No JVD present.  Cardiovascular: Regular rhythm, S1 normal and S2 normal.  No murmur heard. Cath site is stable  Pulmonary/Chest: Effort normal and breath sounds normal.  Musculoskeletal:     Cervical back: Normal range of motion.  Neurological: He is alert and oriented to person, place, and time.  Skin: Skin is warm and dry.    Labs & Radiologic Studies  CBC Recent Labs    04/10/24 1150 04/10/24 1151  HGB 15.3 15.3  HCT 45.0 45.0   Basic Metabolic Panel Recent Labs    93/69/74 1150 04/10/24 1151  NA 139 139  K 4.0 4.0  ECHOCARDIOGRAM COMPLETE Result Date: 03/27/2024 IMPRESSIONS  1. Left ventricular ejection fraction, by estimation, is 30 to 35%. Left ventricular ejection fraction by PLAX is 34 %. The left ventricle has moderate to severely decreased function. The left ventricle demonstrates global hypokinesis. Left ventricular diastolic parameters are consistent with Grade I diastolic dysfunction  (impaired relaxation). The average left ventricular global longitudinal strain is -11.2 %. The global longitudinal strain is abnormal.  2. Right ventricular systolic function is mildly reduced. The right ventricular size is normal.  3. The mitral valve is normal in structure. Mild mitral valve regurgitation.  4. The aortic valve is bicuspid. Aortic valve regurgitation is not visualized. Aortic valve sclerosis/calcification is present, without any evidence of aortic stenosis. Aortic valve mean gradient measures 4.0 mmHg.  5. The inferior vena cava is normal in size with greater than 50% respiratory variability, suggesting right atrial pressure of 3 mmHg.Electronically signed by Redell Cave MD Signature Date/Time: 03/27/2024/5:29:47 PM    Final    Disposition Pt is being discharged home today in good condition. Cath site is stable without signs of bleeding or hematoma.   Follow-up Plans & Appointments  Discharge Instructions     AMB Referral to Cardiac Rehabilitation - Phase II   Complete by: As directed    Diagnosis:  Coronary Stents Stable Angina     After initial evaluation and assessments completed: Virtual Based Care may be provided alone or in conjunction with Phase 2 Cardiac Rehab based on patient barriers.: Yes   Intensive Cardiac Rehabilitation (ICR) [facility] location only OR Traditional Cardiac Rehabilitation (TCR) *If criteria for ICR are not met will enroll in TCR [facility] only): Yes      Follow up appointment scheduled with Medford Meager, NP on 7/9 at 9:15 AM.   Discharge Medications Allergies as of 04/10/2024       Reactions   Beef-derived Drug Products Hives, Itching   Morphine  And Codeine Other (See Comments)   Headaches and creepy skin crawling (11/07/20 - pt currently taking without issues)   Propranolol Hives        Medication List     TAKE these medications    albuterol  108 (90 Base) MCG/ACT inhaler Commonly known as: VENTOLIN  HFA Inhale 2 puffs into the lungs every  6 (six) hours as needed for wheezing or shortness of breath.   alprazolam  2 MG tablet Commonly known as: XANAX  TAKE 1 TABLET(2 MG) BY MOUTH THREE TIMES DAILY AS NEEDED FOR ANXIETY   ascorbic acid 500 MG tablet Commonly known as: VITAMIN C Take 500 mg by mouth daily.   aspirin EC 81 MG tablet Take 81 mg by mouth daily. Swallow whole.   carvedilol  25 MG tablet Commonly known as: COREG  Take 1 tablet (25 mg total) by mouth 2 (two) times daily with a meal.   Centrum Silver 50+Men Tabs Take 1 tablet by mouth daily.   clopidogrel  75 MG tablet Commonly known as: PLAVIX  Take 1 tablet (75 mg total) by mouth daily.   cyclobenzaprine 10 MG tablet Commonly known as: FLEXERIL Take 10 mg by mouth 3 (three) times daily as needed for muscle spasms.   escitalopram  20 MG tablet Commonly known as: LEXAPRO  Take 1 tablet (20 mg total) by mouth daily.   magnesium  oxide 400 MG tablet Commonly known as: MAG-OX Take 1 tablet (400 mg total) by mouth daily.   Melatonin 10 MG Tabs Take 10 mg by mouth at bedtime.   nitroGLYCERIN  0.4 MG SL tablet Commonly known as: NITROSTAT  Place 1  tablet (0.4 mg total) under the tongue every 5 (five) minutes as needed for chest pain.   oxyCODONE-acetaminophen  10-325 MG tablet Commonly known as: PERCOCET Take 1 tablet by mouth every 6 (six) hours as needed for pain.   rosuvastatin 40 MG tablet Commonly known as: CRESTOR TAKE 1 TABLET BY MOUTH EVERY DAY AT BEDTIME   sacubitril -valsartan  49-51 MG Commonly known as: ENTRESTO  Take 1 tablet by mouth 2 (two) times daily.   tamsulosin  0.4 MG Caps capsule Commonly known as: FLOMAX  Take 1 capsule (0.4 mg total) by mouth daily.         Duration of Discharge Encounter: APP Time: 25 minutes   Signed, Lesley LITTIE Maffucci, PA-C 04/10/2024, 3:53 PM

## 2024-04-11 ENCOUNTER — Encounter: Payer: Self-pay | Admitting: Cardiovascular Disease

## 2024-04-14 ENCOUNTER — Other Ambulatory Visit: Payer: Self-pay | Admitting: Nurse Practitioner

## 2024-04-17 ENCOUNTER — Encounter: Payer: Self-pay | Admitting: Family

## 2024-04-17 NOTE — Progress Notes (Signed)
 Established Patient Office Visit  Subjective:  [name], male    DOB: 1955/12/13  Age: 68 y.o. MRN: [id number]  Chief Complaint  Patient presents with   Follow-up    Patient is here today for follow up.  He is doing fairly well overall, still has had some fatigue and decrease in appetite.   His anxiety has ramped up, as his issues with his stomach have, concerned that he might be going back into what had been happening over the past few years.   Needs refills for his meds.     No other concerns at this time.   Past Medical History:  Diagnosis Date   Anginal pain (HCC)    Anxiety    Arthritis    Chronic back pain    Coronary artery disease    a. 2007 CABG ([facility]): LIMA to LAD, SVG to OM and RIMA to right PDA; b. 04/2015 Cath: LM nl, LAD 8m, LCX ok, OM1 100, RCA ok, RPDA 80, Inf septal 70, LIMA->LAD ok, VG->OM1 ok, RIMA->RPDA 50-60ost-->med rx.   HFrEF (heart failure with reduced ejection fraction) (HCC)    a. 03/2024 Echo: EF 30-35%, glob HK, GrI DD, mildly reduced RV fxn, mild MR, AoV sclerosis.   Hyperlipidemia    Hyperlipidemia LDL goal <70    Hypertension    Ischemic cardiomyopathy    a. 03/2024 Echo: EF 30-35%.   Lyme disease    Multilevel degenerative disc disease    Myocardial infarction (HCC) 12/2005   PAD (peripheral artery disease) (HCC)    a. 01/2023 Peripheral Angio: <30% celiac stenosis, no SMA stenosis, patent IMA.   PVC's (premature ventricular contractions)    a. 02/2024 Zio: Predominantly sinus rhythm, average 86 (62-154).  2 SVT runs, fastest 154 x 9 beats, longest 8 beats at 120.  3.1% PAC burden.  8.1% PVC burden.   Tobacco use     Past Surgical History:  Procedure Laterality Date   CARDIAC CATHETERIZATION N/A 05/07/2015   Procedure: Left Heart Cath;  Surgeon: Denyse DELENA Bathe, MD;  Location: [facility];  Service: Cardiovascular;  Laterality: N/A;   COLONOSCOPY WITH PROPOFOL  N/A 11/18/2020   Procedure: COLONOSCOPY WITH BIOPSY;   Surgeon: Jinny Carmine, MD;  Location: [facility];  Service: Endoscopy;  Laterality: N/A;   CORONARY ANGIOPLASTY     CORONARY ARTERY BYPASS GRAFT  02/2006   4 vessel.  [facility]   CORONARY STENT INTERVENTION N/A 04/10/2024   Procedure: CORONARY STENT INTERVENTION;  Surgeon: Darron Deatrice DELENA, MD;  Location: [facility];  Service: Cardiovascular;  Laterality: N/A;   ESOPHAGOGASTRODUODENOSCOPY (EGD) WITH PROPOFOL  N/A 12/04/2022   Procedure: ESOPHAGOGASTRODUODENOSCOPY (EGD) WITH PROPOFOL ;  Surgeon: Jinny Carmine, MD;  Location: [facility];  Service: Endoscopy;  Laterality: N/A;   POLYPECTOMY N/A 11/18/2020   Procedure: POLYPECTOMY;  Surgeon: Jinny Carmine, MD;  Location: [facility];  Service: Endoscopy;  Laterality: N/A;   RIGHT/LEFT HEART CATH AND CORONARY ANGIOGRAPHY Bilateral 04/10/2024   Procedure: RIGHT/LEFT HEART CATH AND CORONARY ANGIOGRAPHY;  Surgeon: Darron Deatrice DELENA, MD;  Location: [facility];  Service: Cardiovascular;  Laterality: Bilateral;   VISCERAL ANGIOGRAPHY N/A 01/21/2023   Procedure: VISCERAL ANGIOGRAPHY;  Surgeon: Marea Selinda RAMAN, MD;  Location: [facility];  Service: Cardiovascular;  Laterality: N/A;    Social History   Socioeconomic History   Marital status: Married    Spouse name: Not on file   Number of children: Not on file  Years of education: Not on file   Highest education level: Not on file  Occupational History   Not on file  Tobacco Use   Smoking status: Every Day    Current packs/day: 0.00    Average packs/day: 0.5 packs/day for 45.0 years (22.5 ttl pk-yrs)    Types: Cigarettes    Start date: 07/10/1975    Last attempt to quit: 07/09/2020    Years since quitting: 3.7   Smokeless tobacco: Never  Vaping Use   Vaping status: Every Day   Start date: 07/12/2020   Substances: Nicotine-salt  Substance and Sexual Activity   Alcohol use: No   Drug use: No   Sexual activity: Not on file  Other Topics Concern   Not on  file  Social History Narrative   Not on file   Social Drivers of Health   Financial Resource Strain: Not on file  Food Insecurity: Not on file  Transportation Needs: Not on file  Physical Activity: Not on file  Stress: Not on file  Social Connections: Not on file  Intimate Partner Violence: Not on file    Family History  Problem Relation Age of Onset   Heart attack Mother    Heart attack Father    Heart attack Sister    Heart attack Brother    Heart attack Brother    Migraines Daughter    Hyperlipidemia Daughter    Anxiety disorder Daughter     Allergies  Allergen Reactions   Beef-Derived Drug Products Hives and Itching   Morphine  And Codeine Other (See Comments)    Headaches and creepy skin crawling (11/07/20 - pt currently taking without issues)   Propranolol Hives    Review of Systems  Constitutional:  Positive for malaise/fatigue.  Gastrointestinal:        Decreased appetite  Psychiatric/Behavioral:  Positive for depression. The patient is nervous/anxious.   All other systems reviewed and are negative.      Objective:   BP (!) 188/88   Pulse 86   Ht 6' 1 (1.854 m)   Wt 165 lb 6.4 oz (75 kg)   SpO2 95%   BMI 21.82 kg/m   Vitals:   12/17/23 1440  BP: (!) 188/88  Pulse: 86  Height: 6' 1 (1.854 m)  Weight: 165 lb 6.4 oz (75 kg)  SpO2: 95%  BMI (Calculated): 21.83    Physical Exam Vitals and nursing note reviewed.  Constitutional:      Appearance: Normal appearance. He is normal weight.  Eyes:     Extraocular Movements: Extraocular movements intact.     Conjunctiva/sclera: Conjunctivae normal.     Pupils: Pupils are equal, round, and reactive to light.  Cardiovascular:     Rate and Rhythm: Normal rate and regular rhythm.     Pulses: Normal pulses.     Heart sounds: Normal heart sounds.  Pulmonary:     Effort: Pulmonary effort is normal.     Breath sounds: Normal breath sounds.  Neurological:     General: No focal deficit present.      Mental Status: He is alert and oriented to person, place, and time.     Motor: Weakness (generalized) present.  Psychiatric:        Attention and Perception: Attention and perception normal.        Mood and Affect: Mood is anxious and depressed. Affect is tearful.        Speech: Speech normal.        Behavior: Behavior  normal. Behavior is cooperative.        Thought Content: Thought content normal.        Cognition and Memory: Cognition and memory normal.        Judgment: Judgment normal.      No results found for any visits on 12/17/23.  Recent Results (from the past 2160 hours)  CBC with Differential     Status: None   Collection Time: 02/17/24  6:17 PM  Result Value Ref Range   WBC 7.0 4.0 - 10.5 K/uL   RBC 4.49 4.22 - 5.81 MIL/uL   Hemoglobin 14.7 13.0 - 17.0 g/dL   HCT 57.0 60.9 - 47.9 %   MCV 95.5 80.0 - 100.0 fL   MCH 32.7 26.0 - 34.0 pg   MCHC 34.3 30.0 - 36.0 g/dL   RDW 87.3 88.4 - 84.4 %   Platelets 200 150 - 400 K/uL   nRBC 0.0 0.0 - 0.2 %   Neutrophils Relative % 66 %   Neutro Abs 4.6 1.7 - 7.7 K/uL   Lymphocytes Relative 22 %   Lymphs Abs 1.6 0.7 - 4.0 K/uL   Monocytes Relative 10 %   Monocytes Absolute 0.7 0.1 - 1.0 K/uL   Eosinophils Relative 1 %   Eosinophils Absolute 0.1 0.0 - 0.5 K/uL   Basophils Relative 1 %   Basophils Absolute 0.0 0.0 - 0.1 K/uL   Immature Granulocytes 0 %   Abs Immature Granulocytes 0.02 0.00 - 0.07 K/uL    Comment: Performed at [facility], [address] [place], [place] [address]  Comprehensive metabolic panel     Status: Abnormal   Collection Time: 02/17/24  6:17 PM  Result Value Ref Range   Sodium 136 135 - 145 mmol/L   Potassium 3.2 (L) 3.5 - 5.1 mmol/L   Chloride 101 98 - 111 mmol/L   CO2 25 22 - 32 mmol/L   Glucose, Bld 68 (L) 70 - 99 mg/dL    Comment: Glucose reference range applies only to samples taken after fasting for at least 8 hours.   BUN <5 (L) 8 - 23 mg/dL   Creatinine, Ser 9.41 (L) 0.61 - 1.24  mg/dL   Calcium  8.4 (L) 8.9 - 10.3 mg/dL   Total Protein 6.2 (L) 6.5 - 8.1 g/dL   Albumin 3.8 3.5 - 5.0 g/dL   AST 23 15 - 41 U/L   ALT 15 0 - 44 U/L   Alkaline Phosphatase 41 38 - 126 U/L   Total Bilirubin 0.7 0.0 - 1.2 mg/dL   GFR, Estimated >39 >39 mL/min    Comment: (NOTE) Calculated using the CKD-EPI Creatinine Equation (2021)    Anion gap 10 5 - 15    Comment: Performed at [facility], [address] [place], [place] [address]  Acetaminophen  level     Status: Abnormal   Collection Time: 02/17/24  6:17 PM  Result Value Ref Range   Acetaminophen  (Tylenol ), Serum <10 (L) 10 - 30 ug/mL    Comment: (NOTE) Therapeutic concentrations vary significantly. A range of 10-30 ug/mL  may be an effective concentration for many patients. However, some  are best treated at concentrations outside of this range. Acetaminophen  concentrations >150 ug/mL at 4 hours after ingestion  and >50 ug/mL at 12 hours after ingestion are often associated with  toxic reactions.  Performed at [facility], [address] [place], [place] [address]   Salicylate level     Status: Abnormal  Collection Time: 02/17/24  6:17 PM  Result Value Ref Range   Salicylate Lvl <7.0 (L) 7.0 - 30.0 mg/dL    Comment: Performed at [facility], [address] [place], [place] [address]  Ethanol     Status: Abnormal   Collection Time: 02/17/24  6:17 PM  Result Value Ref Range   Alcohol, Ethyl (B) 219 (H) <15 mg/dL    Comment: Please note change in reference range. (NOTE) For medical purposes only. Performed at [facility], [address] [place], [place] [address]   CBG monitoring, ED     Status: Abnormal   Collection Time: 02/17/24  8:08 PM  Result Value Ref Range   Glucose-Capillary 107 (H) 70 - 99 mg/dL    Comment: Glucose reference range applies only to samples taken after fasting for at least 8 hours.  ECHOCARDIOGRAM COMPLETE     Status: None   Collection Time:  03/27/24  4:22 PM  Result Value Ref Range   AR max vel 2.81 cm2   AV Peak grad 6.7 mmHg   Ao pk vel 1.29 m/s   S' Lateral 4.52 cm   Area-P 1/2 3.85 cm2   AV Area VTI 2.64 cm2   AV Mean grad 4.0 mmHg   AV Area mean vel 2.62 cm2   Est EF 30 - 35%   Basic metabolic panel with GFR     Status: Abnormal   Collection Time: 03/31/24 10:48 AM  Result Value Ref Range   Glucose 89 70 - 99 mg/dL   BUN 12 8 - 27 mg/dL   Creatinine, Ser 9.16 0.76 - 1.27 mg/dL   eGFR 97 >40 fO/fpw/8.26   BUN/Creatinine Ratio 14 10 - 24   Sodium 137 134 - 144 mmol/L   Potassium 5.4 (H) 3.5 - 5.2 mmol/L   Chloride 96 96 - 106 mmol/L   CO2 24 20 - 29 mmol/L   Calcium  9.2 8.6 - 10.2 mg/dL  CBC     Status: Abnormal   Collection Time: 03/31/24 10:48 AM  Result Value Ref Range   WBC 8.3 3.4 - 10.8 x10E3/uL   RBC 4.54 4.14 - 5.80 x10E6/uL   Hemoglobin 15.3 13.0 - 17.7 g/dL   Hematocrit 53.9 62.4 - 51.0 %   MCV 101 (H) 79 - 97 fL   MCH 33.7 (H) 26.6 - 33.0 pg   MCHC 33.3 31.5 - 35.7 g/dL   RDW 87.5 88.3 - 84.5 %   Platelets 173 150 - 450 x10E3/uL  Magnesium      Status: None   Collection Time: 03/31/24 10:48 AM  Result Value Ref Range   Magnesium  2.3 1.6 - 2.3 mg/dL  Basic metabolic panel with GFR     Status: Abnormal   Collection Time: 04/05/24  5:43 PM  Result Value Ref Range   Sodium 135 135 - 145 mmol/L   Potassium 4.3 3.5 - 5.1 mmol/L   Chloride 100 98 - 111 mmol/L   CO2 29 22 - 32 mmol/L   Glucose, Bld 112 (H) 70 - 99 mg/dL    Comment: Glucose reference range applies only to samples taken after fasting for at least 8 hours.   BUN 16 8 - 23 mg/dL   Creatinine, Ser 9.25 0.61 - 1.24 mg/dL   Calcium  9.1 8.9 - 10.3 mg/dL   GFR, Estimated >39 >39 mL/min    Comment: (NOTE) Calculated using the CKD-EPI Creatinine Equation (2021)    Anion gap 6 5 - 15  Comment: Performed at [facility], [address] [place], [place] [address]  I-STAT 7, (LYTES, BLD GAS, ICA, H+H)     Status: Abnormal    Collection Time: 04/10/24 11:50 AM  Result Value Ref Range   pH, Arterial 7.370 7.35 - 7.45   pCO2 arterial 42.3 32 - 48 mmHg   pO2, Arterial 79 (L) 83 - 108 mmHg   Bicarbonate 24.5 20.0 - 28.0 mmol/L   TCO2 26 22 - 32 mmol/L   O2 Saturation 95 %   Acid-base deficit 1.0 0.0 - 2.0 mmol/L   Sodium 139 135 - 145 mmol/L   Potassium 4.0 3.5 - 5.1 mmol/L   Calcium , Ion 1.20 1.15 - 1.40 mmol/L   HCT 45.0 39.0 - 52.0 %   Hemoglobin 15.3 13.0 - 17.0 g/dL   Sample type ARTERIAL   POCT I-Stat EG7     Status: None   Collection Time: 04/10/24 11:51 AM  Result Value Ref Range   pH, Ven 7.341 7.25 - 7.43   pCO2, Ven 49.8 44 - 60 mmHg   pO2, Ven 34 32 - 45 mmHg   Bicarbonate 26.9 20.0 - 28.0 mmol/L   TCO2 28 22 - 32 mmol/L   O2 Saturation 62 %   Acid-Base Excess 0.0 0.0 - 2.0 mmol/L   Sodium 139 135 - 145 mmol/L   Potassium 4.0 3.5 - 5.1 mmol/L   Calcium , Ion 1.24 1.15 - 1.40 mmol/L   HCT 45.0 39.0 - 52.0 %   Hemoglobin 15.3 13.0 - 17.0 g/dL   Sample type MIXED VENOUS SAMPLE    Comment NOTIFIED PHYSICIAN   POCT Activated clotting time     Status: None   Collection Time: 04/10/24 12:18 PM  Result Value Ref Range   Activated Clotting Time 412 seconds    Comment: Reference range 74-137 seconds for patients not on anticoagulant therapy.       Assessment & Plan Uncomplicated opioid dependence (HCC) Patient is seen by Pain Management, who manage this condition.  He is well controlled with current therapy.   Will defer to them for further changes to plan of care.  Anxiety Sending refill for alprazolam  RX.  Will reassess at follow up.  Mixed hyperlipidemia Continue current therapy for lipid control. Will modify as needed based on labwork results.   Other fatigue Patient reassured that he will likely experience ups and downs while things continue to normalize.  Advised pt to let me know if he continues to have symptoms.      Return in about 3 months (around 03/18/2024).   Total  time spent: 20 minutes  ALAN CHRISTELLA ARRANT, FNP  12/17/2023   This document may have been prepared by [facility] Voice Recognition software and as such may include unintentional dictation errors.

## 2024-04-17 NOTE — Assessment & Plan Note (Signed)
 Patient reassured that he will likely experience ups and downs while things continue to normalize.  Advised pt to let me know if he continues to have symptoms.

## 2024-04-17 NOTE — Assessment & Plan Note (Signed)
 Continue current therapy for lipid control. Will modify as needed based on labwork results.

## 2024-04-17 NOTE — Assessment & Plan Note (Signed)
 Sending refill for alprazolam  RX.  Will reassess at follow up.

## 2024-04-17 NOTE — Assessment & Plan Note (Signed)
 Patient is seen by Pain Management, who manage this condition.  He is well controlled with current therapy.   Will defer to them for further changes to plan of care.

## 2024-04-19 ENCOUNTER — Encounter: Payer: Self-pay | Admitting: Nurse Practitioner

## 2024-04-19 ENCOUNTER — Ambulatory Visit: Attending: Nurse Practitioner | Admitting: Nurse Practitioner

## 2024-04-19 VITALS — BP 100/58 | HR 60 | Ht 73.0 in | Wt 163.6 lb

## 2024-04-19 DIAGNOSIS — E785 Hyperlipidemia, unspecified: Secondary | ICD-10-CM

## 2024-04-19 DIAGNOSIS — I10 Essential (primary) hypertension: Secondary | ICD-10-CM

## 2024-04-19 DIAGNOSIS — Z720 Tobacco use: Secondary | ICD-10-CM | POA: Diagnosis not present

## 2024-04-19 DIAGNOSIS — I2510 Atherosclerotic heart disease of native coronary artery without angina pectoris: Secondary | ICD-10-CM

## 2024-04-19 DIAGNOSIS — I493 Ventricular premature depolarization: Secondary | ICD-10-CM

## 2024-04-19 DIAGNOSIS — I255 Ischemic cardiomyopathy: Secondary | ICD-10-CM

## 2024-04-19 DIAGNOSIS — I5020 Unspecified systolic (congestive) heart failure: Secondary | ICD-10-CM

## 2024-04-19 NOTE — Progress Notes (Signed)
 Office Visit    Patient Name: Joel Howe Date of Encounter: 04/19/2024  Primary Care Provider:  Orlean Alan HERO, FNP Primary Cardiologist:  Deatrice Cage, MD  Cardiology APP:  Vivienne Lonni Ingle, NP   Chief Complaint    68 y.o. male with a history of CAD status post coronary artery bypass grafting, ischemic cardiomyopathy, HFrEF, hypertension, hyperlipidemia, frequent PVCs, mild peripheral arterial disease, tobacco abuse, and chronic pain on narcotics, who presents for f/u after recent PCI.  Past Medical History   Subjective   Past Medical History:  Diagnosis Date   Anginal pain (HCC)    Anxiety    Arthritis    Chronic back pain    Coronary artery disease    a. 2007 CABG ([facility]): LIMA to LAD, SVG to OM, and RIMA to RPDA; b. 04/2015 Cath: LM nl, LAD 55m, LCX ok, OM1 100, RCA ok, RPDA 80, Inf septal 70, LIMA->LAD ok, VG->OM1 ok, RIMA->RPDA 50-60ost-->med rx; c. 03/2024 PCI: LM 40ost/m, LAD 152m, D1 95, D2 80, LCX 30ost/p, OM2 80, OM3 100, RCA 100/pm, 49m/d, VG->OM2 mild dzs, LIMA->LAD ok, RIMA->RPDA 90p (3.5x15 Onyx Frontier DES).   HFrEF (heart failure with reduced ejection fraction) (HCC)    a. 03/2024 Echo: EF 30-35%, glob HK, GrI DD, mildly reduced RV fxn, mild MR, AoV sclerosis.   Hyperlipidemia    Hyperlipidemia LDL goal <70    Hypertension    Ischemic cardiomyopathy    a. 03/2024 Echo: EF 30-35%.   Lyme disease    Multilevel degenerative disc disease    Myocardial infarction (HCC) 12/2005   PAD (peripheral artery disease) (HCC)    a. 01/2023 Peripheral Angio: <30% celiac stenosis, no SMA stenosis, patent IMA.   PVC's (premature ventricular contractions)    a. 02/2024 Zio: Predominantly sinus rhythm, average 86 (62-154).  2 SVT runs, fastest 154 x 9 beats, longest 8 beats at 120.  3.1% PAC burden.  8.1% PVC burden.   Tobacco use    Past Surgical History:  Procedure Laterality Date   CARDIAC CATHETERIZATION N/A 05/07/2015   Procedure: Left Heart Cath;   Surgeon: Denyse DELENA Bathe, MD;  Location: [facility];  Service: Cardiovascular;  Laterality: N/A;   COLONOSCOPY WITH PROPOFOL  N/A 11/18/2020   Procedure: COLONOSCOPY WITH BIOPSY;  Surgeon: Jinny Carmine, MD;  Location: [facility];  Service: Endoscopy;  Laterality: N/A;   CORONARY ANGIOPLASTY     CORONARY ARTERY BYPASS GRAFT  02/2006   4 vessel.  [facility]   CORONARY STENT INTERVENTION N/A 04/10/2024   Procedure: CORONARY STENT INTERVENTION;  Surgeon: Cage Deatrice DELENA, MD;  Location: [facility];  Service: Cardiovascular;  Laterality: N/A;   ESOPHAGOGASTRODUODENOSCOPY (EGD) WITH PROPOFOL  N/A 12/04/2022   Procedure: ESOPHAGOGASTRODUODENOSCOPY (EGD) WITH PROPOFOL ;  Surgeon: Jinny Carmine, MD;  Location: [facility];  Service: Endoscopy;  Laterality: N/A;   POLYPECTOMY N/A 11/18/2020   Procedure: POLYPECTOMY;  Surgeon: Jinny Carmine, MD;  Location: [facility];  Service: Endoscopy;  Laterality: N/A;   RIGHT/LEFT HEART CATH AND CORONARY ANGIOGRAPHY Bilateral 04/10/2024   Procedure: RIGHT/LEFT HEART CATH AND CORONARY ANGIOGRAPHY;  Surgeon: Cage Deatrice DELENA, MD;  Location: [facility];  Service: Cardiovascular;  Laterality: Bilateral;   VISCERAL ANGIOGRAPHY N/A 01/21/2023   Procedure: VISCERAL ANGIOGRAPHY;  Surgeon: Marea Selinda RAMAN, MD;  Location: [facility];  Service: Cardiovascular;  Laterality: N/A;    Allergies  Allergies  Allergen Reactions   Beef-Derived Drug Products Hives and Itching  Morphine  And Codeine Other (See Comments)    Headaches and creepy skin crawling (11/07/20 - pt currently taking without issues)   Propranolol Hives       History of Present Illness      68 y.o. y/o male with a history of CAD status post coronary artery bypass grafting, hypertension, hyperlipidemia, frequent PVCs, mild peripheral arterial disease, tobacco abuse, and chronic pain on narcotics.  He suffered a myocardial infarction in 2007 and required CABG x 3 at  [facility].  He underwent repeat diagnostic catheterization in 2016 which showed severe native multivessel disease with 3 of 3 patent grafts.  There was a 50 to 60% stenosis in the ostial RIMA to the RPDA.  In 2021, he was hospitalized at [facility] with blood in his stool and was suspected of having ischemic colitis.  He was hypotensive on presentation but responded to IV fluids.  He subsequently reported quitting alcohol.  Patient was seen in May 2025, reestablishing care after 2 years.  He reported worsening exertional dyspnea without chest pain.  He continued to smoke.  He was also noted to have ventricular bigeminy on ECG.  A ZIO monitor was obtained and showed an 8.1% PVC burden with 2 brief runs of SVT.  He was seen in the emergency department on Feb 17, 2024 after drinking a pint of liquor and falling.  Head CT and CT of the C-spine were only remarkable for scalp hematoma.  Lab work was notable for potassium of 3.2.  He was subsequently discharged home.  Considering findings on monitoring and alcoholism, he was advised to start taking magnesium  oxide 400 mg daily.  Due to prior symptoms of dyspnea, echocardiogram was obtained early 03/2024, showing an EF of 30 to 35% with global hypokinesis, grade 1 diastolic dysfunction, mildly reduced RV function, mild MR, and aortic sclerosis without stenosis.  At office f/u 03/2024, [name] reported progressive exertional dyspnea and angina.  He subsequently underwent right and left heart cardiac catheterization which showed severe native multivessel disease with severe proximal disease in the RIMA to the RPDA.  The vein graft to the OM 2, and the LIMA to the LAD were patent.  The RIMA to the RPDA was successfully treated with drug-eluting stent.  Right heart filling pressures were relatively normal with only mild pulmonary hypertension [32/17 up (21)].     Since his PCI, he has noted significant improvement in activity tolerance with no chest pain or dyspnea.  He says he has  not smoked since his heart cath and has not had a drink since prior to his last visit.  He denies palpitations, PND, orthopnea, dizziness, syncope, edema, or early satiety. Objective   Home Medications    Current Outpatient Medications  Medication Sig Dispense Refill   albuterol  (VENTOLIN  HFA) 108 (90 Base) MCG/ACT inhaler Inhale 2 puffs into the lungs every 6 (six) hours as needed for wheezing or shortness of breath. 8 g 2   alprazolam  (XANAX ) 2 MG tablet TAKE 1 TABLET(2 MG) BY MOUTH THREE TIMES DAILY AS NEEDED FOR ANXIETY 90 tablet 2   ascorbic acid  (VITAMIN C) 500 MG tablet Take 500 mg by mouth daily.     aspirin  EC 81 MG tablet Take 81 mg by mouth daily. Swallow whole.     carvedilol  (COREG ) 25 MG tablet Take 1 tablet (25 mg total) by mouth 2 (two) times daily with a meal. 180 tablet 3   clopidogrel  (PLAVIX ) 75 MG tablet Take 1 tablet (75 mg total) by  mouth daily. 90 tablet 1   cyclobenzaprine  (FLEXERIL ) 10 MG tablet Take 10 mg by mouth 3 (three) times daily as needed for muscle spasms.     escitalopram  (LEXAPRO ) 20 MG tablet Take 1 tablet (20 mg total) by mouth daily. 90 tablet 1   magnesium  oxide (MAG-OX) 400 MG tablet Take 1 tablet (400 mg total) by mouth daily.     Melatonin 10 MG TABS Take 10 mg by mouth at bedtime.     Multiple Vitamins-Minerals (CENTRUM SILVER  50+MEN) TABS Take 1 tablet by mouth daily.     nitroGLYCERIN  (NITROSTAT ) 0.4 MG SL tablet Place 1 tablet (0.4 mg total) under the tongue every 5 (five) minutes as needed for chest pain. 25 tablet 1   oxyCODONE -acetaminophen  (PERCOCET) 10-325 MG per tablet Take 1 tablet by mouth every 6 (six) hours as needed for pain.     rosuvastatin  (CRESTOR ) 40 MG tablet TAKE 1 TABLET BY MOUTH EVERY DAY AT BEDTIME 100 tablet 2   sacubitril -valsartan  (ENTRESTO ) 49-51 MG Take 1 tablet by mouth 2 (two) times daily. 60 tablet 1   tamsulosin  (FLOMAX ) 0.4 MG CAPS capsule Take 1 capsule (0.4 mg total) by mouth daily. 90 capsule 1   Current  Facility-Administered Medications  Medication Dose Route Frequency Provider Last Rate Last Admin   albuterol  (VENTOLIN  HFA) 108 (90 Base) MCG/ACT inhaler 2 puff  2 puff Inhalation Once          Physical Exam    VS:  BP (!) 100/58 (BP Location: Left Arm, Patient Position: Sitting, Cuff Size: Normal)   Pulse 60   Ht 6' 1 (1.854 m)   Wt 163 lb 9.6 oz (74.2 kg)   SpO2 98%   BMI 21.58 kg/m  , BMI Body mass index is 21.58 kg/m.          Cardiac Rehabilitation Eligibility Assessment  The patient is ready to start cardiac rehabilitation from a cardiac standpoint.   GEN: Well nourished, well developed, in no acute distress. HEENT: normal. Neck: Supple, no JVD, carotid bruits, or masses. Cardiac: RRR, no murmurs, rubs, or gallops. No clubbing, cyanosis, edema.  Radials 2+/PT 2+ and equal bilaterally.  Left ulnar catheterization site without bleeding, bruit, or hematoma. Respiratory:  Respirations regular and unlabored, clear to auscultation bilaterally. GI: Soft, nontender, nondistended, BS + x 4. MS: no deformity or atrophy. Skin: warm and dry, no rash. Neuro:  Strength and sensation are intact. Psych: Normal affect.  Accessory Clinical Findings    ECG personally reviewed by me today - EKG Interpretation Date/Time:  Wednesday April 19 2024 09:27:40 EDT Ventricular Rate:  60 PR Interval:  228 QRS Duration:  104 QT Interval:  426 QTC Calculation: 426 R Axis:   54  Text Interpretation: Sinus rhythm with 1st degree A-V block Nonspecific T wave abnormality Confirmed by Vivienne Bruckner [phone]) on 04/19/2024 9:48:52 AM  - no acute changes.  Lab Results  Component Value Date   WBC 8.3 03/31/2024   HGB 15.3 04/10/2024   HCT 45.0 04/10/2024   MCV 101 (H) 03/31/2024   PLT 173 03/31/2024   Lab Results  Component Value Date   CREATININE 0.74 04/05/2024   BUN 16 04/05/2024   NA 139 04/10/2024   K 4.0 04/10/2024   CL 100 04/05/2024   CO2 29 04/05/2024   Lab Results  Component  Value Date   ALT 15 02/17/2024   AST 23 02/17/2024   ALKPHOS 41 02/17/2024   BILITOT 0.7 02/17/2024   Lab Results  Component Value Date   CHOL 120 09/13/2023   HDL 49 09/13/2023   LDLCALC 56 09/13/2023   TRIG 75 09/13/2023   CHOLHDL 2.4 09/13/2023    Lab Results  Component Value Date   HGBA1C 5.5 09/13/2023   Lab Results  Component Value Date   TSH 2.920 09/13/2023       Assessment & Plan    1.  Coronary artery disease: Status post prior CABG x 3 in 2007 with patent grafts on catheterization 2016.  In the setting of recent finding of a 0.1% PVC burden, and reduced EF of 30 to 35% on echocardiogram, along with progressive angina and dyspnea, he underwent diagnostic catheterization in June which showed severe stenosis in the proximal RIMA to the RPDA, which was successfully treated with a drug-eluting stent.  Since then, he has done exceptionally well with significant proved activity tolerance and with resolution of chest pain or dyspnea.  He has also stopped smoking.  He is interested in cardiac rehabilitation.  He remains on aspirin , Plavix , beta-blocker, and statin therapy.  2.  Ischemic cardiomyopathy/chronic heart failure with reduced ejection fraction: EF 30 to 35% with global hypokinesis, grade 1 diastolic dysfunction, mildly reduced RV function, mild MR, and aortic sclerosis.  Significant symptomatic improvement following PCI.  Euvolemic on examination.  He remains on beta-blocker and Entresto .  Blood pressure is soft and therefore we will hold off on adding MRA or SGLT2 inhibitor.  Plan for follow-up echo in approximately 3 months.  3.  Primary hypertension: Pressure soft today at 100/58.  He is asymptomatic.  He remains on beta-blocker and Arni.  4.  Hyperlipidemia: LDL 56 late last year.  Remains on statin therapy.  5.  PVCs: 8.1% PVC burden on recent monitoring in the setting of cardiomyopathy.  Denies palpitations.  Continue carvedilol  therapy.  6.  Ongoing tobacco  abuse: Was smoking 1 to 1-1/2 packs/day but says since his PCI, he has quit.  Congratulated him for this and strongly encouraged him to remain off cigarettes.  7.  Alcohol use: Says he has not had any drinks since prior to his last visit.  Congratulated him for this and strongly encouraged him to remain off of alcohol.  8.  Disposition: Follow-up in 1 month or sooner if necessary.  Lonni Meager, NP 04/19/2024, 12:54 PM

## 2024-04-19 NOTE — Patient Instructions (Signed)
 Medication Instructions:   Your physician recommends that you continue on your current medications as directed. Please refer to the Current Medication list given to you today.  *If you need a refill on your cardiac medications before your next appointment, please call your pharmacy*  Lab Work:  No labs ordered today   If you have labs (blood work) drawn today and your tests are completely normal, you will receive your results only by: MyChart Message (if you have MyChart) OR A paper copy in the mail If you have any lab test that is abnormal or we need to change your treatment, we will call you to review the results.  Testing/Procedures:  No test ordered today   Follow-Up: At [facility], you and your health needs are our priority.  As part of our continuing mission to provide you with exceptional heart care, our providers are all part of one team.  This team includes your primary Cardiologist (physician) and Advanced Practice Providers or APPs (Physician Assistants and Nurse Practitioners) who all work together to provide you with the care you need, when you need it.  Your next appointment:   1 month(s)  Provider:   You may see Deatrice Cage, MD or one of the following Advanced Practice Providers on your designated Care Team:   Lonni Meager, NP

## 2024-04-27 DIAGNOSIS — M47816 Spondylosis without myelopathy or radiculopathy, lumbar region: Secondary | ICD-10-CM | POA: Diagnosis not present

## 2024-04-27 DIAGNOSIS — Z79891 Long term (current) use of opiate analgesic: Secondary | ICD-10-CM | POA: Diagnosis not present

## 2024-04-27 DIAGNOSIS — M150 Primary generalized (osteo)arthritis: Secondary | ICD-10-CM | POA: Diagnosis not present

## 2024-04-27 DIAGNOSIS — G894 Chronic pain syndrome: Secondary | ICD-10-CM | POA: Diagnosis not present

## 2024-04-28 ENCOUNTER — Other Ambulatory Visit: Payer: Self-pay

## 2024-04-28 ENCOUNTER — Emergency Department
Admission: EM | Admit: 2024-04-28 | Discharge: 2024-04-29 | Disposition: A | Discharge status: Home / Self Care | Attending: Emergency Medicine | Admitting: Emergency Medicine

## 2024-04-28 ENCOUNTER — Encounter: Payer: Self-pay | Admitting: Emergency Medicine

## 2024-04-28 DIAGNOSIS — F10129 Alcohol abuse with intoxication, unspecified: Secondary | ICD-10-CM | POA: Insufficient documentation

## 2024-04-28 DIAGNOSIS — I509 Heart failure, unspecified: Secondary | ICD-10-CM | POA: Diagnosis not present

## 2024-04-28 DIAGNOSIS — F17210 Nicotine dependence, cigarettes, uncomplicated: Secondary | ICD-10-CM | POA: Insufficient documentation

## 2024-04-28 DIAGNOSIS — I110 Hypertensive heart disease with heart failure: Secondary | ICD-10-CM | POA: Diagnosis not present

## 2024-04-28 DIAGNOSIS — F19920 Other psychoactive substance use, unspecified with intoxication, uncomplicated: Secondary | ICD-10-CM | POA: Diagnosis not present

## 2024-04-28 DIAGNOSIS — I2510 Atherosclerotic heart disease of native coronary artery without angina pectoris: Secondary | ICD-10-CM | POA: Insufficient documentation

## 2024-04-28 DIAGNOSIS — F4310 Post-traumatic stress disorder, unspecified: Secondary | ICD-10-CM | POA: Diagnosis not present

## 2024-04-28 DIAGNOSIS — G8929 Other chronic pain: Secondary | ICD-10-CM | POA: Insufficient documentation

## 2024-04-28 DIAGNOSIS — F1990 Other psychoactive substance use, unspecified, uncomplicated: Secondary | ICD-10-CM | POA: Diagnosis not present

## 2024-04-28 DIAGNOSIS — Y908 Blood alcohol level of 240 mg/100 ml or more: Secondary | ICD-10-CM | POA: Insufficient documentation

## 2024-04-28 DIAGNOSIS — F1010 Alcohol abuse, uncomplicated: Secondary | ICD-10-CM | POA: Diagnosis not present

## 2024-04-28 DIAGNOSIS — F32A Depression, unspecified: Secondary | ICD-10-CM | POA: Insufficient documentation

## 2024-04-28 LAB — COMPREHENSIVE METABOLIC PANEL WITH GFR
ALT: 13 U/L (ref 0–44)
AST: 28 U/L (ref 15–41)
Albumin: 4.4 g/dL (ref 3.5–5.0)
Alkaline Phosphatase: 53 U/L (ref 38–126)
Anion gap: 15 (ref 5–15)
BUN: 5 mg/dL — ABNORMAL LOW (ref 8–23)
CO2: 20 mmol/L — ABNORMAL LOW (ref 22–32)
Calcium: 9.2 mg/dL (ref 8.9–10.3)
Chloride: 100 mmol/L (ref 98–111)
Creatinine, Ser: 0.67 mg/dL (ref 0.61–1.24)
GFR, Estimated: >60 mL/min (ref >60)
Glucose, Bld: 160 mg/dL — ABNORMAL HIGH (ref 70–99)
Potassium: 3.8 mmol/L (ref 3.5–5.1)
Sodium: 135 mmol/L (ref 135–145)
Total Bilirubin: 1 mg/dL (ref 0.0–1.2)
Total Protein: 8 g/dL (ref 6.5–8.1)

## 2024-04-28 LAB — CBC
HCT: 47.3 % (ref 39.0–52.0)
Hemoglobin: 16.9 g/dL (ref 13.0–17.0)
MCH: 33.3 pg (ref 26.0–34.0)
MCHC: 35.7 g/dL (ref 30.0–36.0)
MCV: 93.1 fL (ref 80.0–100.0)
Platelets: 229 K/uL (ref 150–400)
RBC: 5.08 MIL/uL (ref 4.22–5.81)
RDW: 12.1 % (ref 11.5–15.5)
WBC: 9.9 K/uL (ref 4.0–10.5)
nRBC: 0 % (ref 0.0–0.2)

## 2024-04-28 LAB — ETHANOL: Alcohol, Ethyl (B): 247 mg/dL — ABNORMAL HIGH (ref <15)

## 2024-04-28 LAB — CBG MONITORING, ED: Glucose-Capillary: 111 mg/dL — ABNORMAL HIGH (ref 70–99)

## 2024-04-28 MED ORDER — LORAZEPAM 2 MG PO TABS: 0.0000 mg | ORAL_TABLET | Freq: Two times a day (BID) | ORAL | Status: DC

## 2024-04-28 MED ORDER — LORAZEPAM 2 MG PO TABS
0.0000 mg | ORAL_TABLET | Freq: Four times a day (QID) | ORAL | Status: DC
  Administered 2024-04-28: 2 mg via ORAL
  Administered 2024-04-29: 1 mg via ORAL
  Administered 2024-04-29: 2 mg via ORAL
  Filled 2024-04-28 (×3): qty 1

## 2024-04-28 MED ORDER — THIAMINE HCL 100 MG/ML IJ SOLN: 100.0000 mg | Freq: Every day | INTRAMUSCULAR | Status: DC

## 2024-04-28 MED ORDER — LORAZEPAM 2 MG/ML IJ SOLN: 0.0000 mg | Freq: Four times a day (QID) | INTRAMUSCULAR | Status: DC

## 2024-04-28 MED ORDER — THIAMINE MONONITRATE 100 MG PO TABS
100.0000 mg | ORAL_TABLET | Freq: Every day | ORAL | Status: DC
  Administered 2024-04-29: 100 mg via ORAL
  Filled 2024-04-28: qty 1

## 2024-04-28 MED ORDER — OXYCODONE-ACETAMINOPHEN 5-325 MG PO TABS
1.0000 | ORAL_TABLET | Freq: Once | ORAL | Status: AC
  Administered 2024-04-28: 1 via ORAL
  Filled 2024-04-28: qty 1

## 2024-04-28 MED ORDER — LORAZEPAM 2 MG/ML IJ SOLN: 0.0000 mg | Freq: Two times a day (BID) | INTRAMUSCULAR | Status: DC

## 2024-04-28 NOTE — ED Notes (Signed)
vol/psych consult ordered/pending.. 

## 2024-04-28 NOTE — ED Provider Notes (Signed)
 [facility] Provider Note    Event Date/Time   First MD Initiated Contact with Patient 04/28/24 1949     (approximate)   History   Psychiatric Evaluation   HPI  Joel Howe is a 68 y.o. male with a history of CAD and CHF who presents  after being brought in by his brother who was concerned about him drinking very heavily and having flashbacks to [place].  The patient states he drinks up to 24 beers daily.  He reports feeling anxious and having lots of thoughts turning through his head about people he has wronged.  He denies SI or HI.  He states he is not on any mental health medication.  He denies any acute physical complaints.  I reviewed the past medical records.  The patient was admitted to cardiology last month with CAD requiring a stent.  I do not see any psychiatric evaluations within the last year.   Physical Exam   Triage Vital Signs: ED Triage Vitals [04/28/24 1850]  Encounter Vitals Group     BP (!) 141/89     Girls Systolic BP Percentile      Girls Diastolic BP Percentile      Boys Systolic BP Percentile      Boys Diastolic BP Percentile      Pulse Rate (!) 109     Resp 17     Temp (!) 97.5 F (36.4 C)     Temp Source Oral     SpO2 96 %     Weight 164 lb (74.4 kg)     Height 6' 1 (1.854 m)     Head Circumference      Peak Flow      Pain Score 0     Pain Loc      Pain Education      Exclude from Growth Chart     Most recent vital signs: Vitals:   04/28/24 1850  BP: (!) 141/89  Pulse: (!) 109  Resp: 17  Temp: (!) 97.5 F (36.4 C)  SpO2: 96%     General: Sleeping but arousable, no distress.  CV:  Good peripheral perfusion.  Resp:  Normal effort.  Abd:  No distention.  Other:  Somewhat intoxicated appearing.  Calm and cooperative.   ED Results / Procedures / Treatments   Labs (all labs ordered are listed, but only abnormal results are displayed) Labs Reviewed  COMPREHENSIVE METABOLIC PANEL WITH GFR - Abnormal;  Notable for the following components:      Result Value   CO2 20 (*)    Glucose, Bld 160 (*)    BUN 5 (*)    All other components within normal limits  ETHANOL - Abnormal; Notable for the following components:   Alcohol, Ethyl (B) 247 (*)    All other components within normal limits  CBG MONITORING, ED - Abnormal; Notable for the following components:   Glucose-Capillary 111 (*)    All other components within normal limits  CBC  URINE DRUG SCREEN, QUALITATIVE ([facility] ONLY)     EKG   RADIOLOGY   PROCEDURES:  Critical Care performed: No  Procedures   MEDICATIONS ORDERED IN ED: Medications  LORazepam (ATIVAN) injection 0-4 mg (has no administration in time range)    Or  LORazepam (ATIVAN) tablet 0-4 mg (has no administration in time range)  LORazepam (ATIVAN) injection 0-4 mg (has no administration in time range)    Or  LORazepam (ATIVAN) tablet 0-4 mg (has  no administration in time range)  thiamine (VITAMIN B1) tablet 100 mg (has no administration in time range)    Or  thiamine (VITAMIN B1) injection 100 mg (has no administration in time range)  oxyCODONE -acetaminophen  (PERCOCET/ROXICET) 5-325 MG per tablet 1 tablet (1 tablet Oral Given 04/28/24 2034)     IMPRESSION / MDM / ASSESSMENT AND PLAN / ED COURSE  I reviewed the triage vital signs and the nursing notes.  69 year old male with PMH as noted above presents with alcohol use and mental health concerns.  On exam the patient is somewhat intoxicated appearing.  Vital signs are normal except for borderline tachycardia.  Physical exam is unremarkable for acute findings, and he denies any acute medical complaints other than an exacerbation of chronic back pain.  Differential diagnosis includes, but is not limited to, alcohol abuse, depression, anxiety, adjustment disorder, substance-induced mood disorder.  We will obtain lab workup for medical clearance and psychiatry and TTS consults.  Patient's presentation is most  consistent with acute complicated illness / injury requiring diagnostic workup.  The patient has been placed in psychiatric observation due to the need to provide a safe environment for the patient while obtaining psychiatric consultation and evaluation, as well as ongoing medical and medication management to treat the patient's condition.  The patient has not been placed under full IVC at this time.   ----------------------------------------- 11:17 PM on 04/28/2024 -----------------------------------------  Ethanol level is 247.  CMP and CBC show no acute findings.  Psychiatry consult is pending.  I have ordered CIWA protocol and signed the patient out to the oncoming ED physician Dr. Malvina.  FINAL CLINICAL IMPRESSION(S) / ED DIAGNOSES   Final diagnoses:  Alcohol abuse     Rx / DC Orders   ED Discharge Orders     None        Note:  This document was prepared using Dragon voice recognition software and may include unintentional dictation errors.    Jacolyn Pae, MD 04/28/24 2317

## 2024-04-28 NOTE — ED Notes (Signed)
 Patient belongings:  1 white shirt 1 plaid pants 2 shoes 1 underwear 1 cell phone 1 wallet  Belongings given to patient's brother to take home.

## 2024-04-28 NOTE — ED Triage Notes (Signed)
 Patient to ED via POV for psych evaluation. Pt brought in by brother who states he has been drinking and having flashbacks to [place]. PT reports drinking a 24 pack of beer today. Denies being a daily drinker. Denies SI/HI.

## 2024-04-29 DIAGNOSIS — F19920 Other psychoactive substance use, unspecified with intoxication, uncomplicated: Secondary | ICD-10-CM | POA: Diagnosis not present

## 2024-04-29 DIAGNOSIS — F32A Depression, unspecified: Secondary | ICD-10-CM

## 2024-04-29 DIAGNOSIS — F4310 Post-traumatic stress disorder, unspecified: Secondary | ICD-10-CM | POA: Diagnosis not present

## 2024-04-29 DIAGNOSIS — F1990 Other psychoactive substance use, unspecified, uncomplicated: Secondary | ICD-10-CM

## 2024-04-29 DIAGNOSIS — F1010 Alcohol abuse, uncomplicated: Secondary | ICD-10-CM | POA: Diagnosis not present

## 2024-04-29 MED ORDER — SACUBITRIL-VALSARTAN 49-51 MG PO TABS
1.0000 | ORAL_TABLET | Freq: Two times a day (BID) | ORAL | Status: DC
  Administered 2024-04-29: 1 via ORAL
  Filled 2024-04-29 (×2): qty 1

## 2024-04-29 MED ORDER — OXYCODONE-ACETAMINOPHEN 5-325 MG PO TABS
2.0000 | ORAL_TABLET | Freq: Once | ORAL | Status: AC
  Administered 2024-04-29: 2 via ORAL
  Filled 2024-04-29: qty 2

## 2024-04-29 MED ORDER — ONDANSETRON 4 MG PO TBDP
4.0000 mg | ORAL_TABLET | Freq: Three times a day (TID) | ORAL | Status: DC | PRN
  Administered 2024-04-29: 4 mg via ORAL

## 2024-04-29 MED ORDER — MELATONIN 5 MG PO TABS: 10.0000 mg | ORAL_TABLET | Freq: Every day | ORAL | Status: DC

## 2024-04-29 MED ORDER — ALBUTEROL SULFATE (2.5 MG/3ML) 0.083% IN NEBU
2.5000 mg | INHALATION_SOLUTION | Freq: Four times a day (QID) | RESPIRATORY_TRACT | Status: DC | PRN
  Administered 2024-04-29: 2.5 mg via RESPIRATORY_TRACT
  Filled 2024-04-29: qty 3

## 2024-04-29 MED ORDER — VITAMIN C 500 MG PO TABS
500.0000 mg | ORAL_TABLET | Freq: Every day | ORAL | Status: DC
  Administered 2024-04-29: 500 mg via ORAL
  Filled 2024-04-29: qty 1

## 2024-04-29 MED ORDER — ALPRAZOLAM 0.5 MG PO TABS
1.0000 mg | ORAL_TABLET | Freq: Once | ORAL | Status: AC
  Administered 2024-04-29: 1 mg via ORAL
  Filled 2024-04-29: qty 2

## 2024-04-29 MED ORDER — ONDANSETRON 4 MG PO TBDP
ORAL_TABLET | ORAL | Status: AC
  Filled 2024-04-29: qty 1

## 2024-04-29 MED ORDER — ADULT MULTIVITAMIN W/MINERALS CH
1.0000 | ORAL_TABLET | Freq: Every day | ORAL | Status: DC
  Administered 2024-04-29: 1 via ORAL
  Filled 2024-04-29: qty 1

## 2024-04-29 MED ORDER — ONDANSETRON 4 MG PO TBDP
4.0000 mg | ORAL_TABLET | Freq: Once | ORAL | Status: AC
  Administered 2024-04-29: 4 mg via ORAL
  Filled 2024-04-29: qty 1

## 2024-04-29 MED ORDER — ESCITALOPRAM OXALATE 10 MG PO TABS
20.0000 mg | ORAL_TABLET | Freq: Every day | ORAL | Status: DC
  Administered 2024-04-29: 20 mg via ORAL
  Filled 2024-04-29: qty 2

## 2024-04-29 MED ORDER — ROSUVASTATIN CALCIUM 20 MG PO TABS
40.0000 mg | ORAL_TABLET | Freq: Every day | ORAL | Status: DC
  Filled 2024-04-29: qty 2

## 2024-04-29 MED ORDER — CYCLOBENZAPRINE HCL 10 MG PO TABS: 10.0000 mg | ORAL_TABLET | Freq: Three times a day (TID) | ORAL | Status: DC | PRN

## 2024-04-29 MED ORDER — MAGNESIUM OXIDE -MG SUPPLEMENT 400 (240 MG) MG PO TABS
400.0000 mg | ORAL_TABLET | Freq: Every day | ORAL | Status: DC
  Administered 2024-04-29: 400 mg via ORAL
  Filled 2024-04-29: qty 1

## 2024-04-29 MED ORDER — NITROGLYCERIN 0.4 MG SL SUBL: 0.4000 mg | SUBLINGUAL_TABLET | SUBLINGUAL | Status: DC | PRN

## 2024-04-29 MED ORDER — LORAZEPAM 2 MG/ML IJ SOLN
2.0000 mg | Freq: Once | INTRAMUSCULAR | Status: AC
  Administered 2024-04-29: 2 mg via INTRAMUSCULAR
  Filled 2024-04-29: qty 1

## 2024-04-29 MED ORDER — ASPIRIN 81 MG PO TBEC
81.0000 mg | DELAYED_RELEASE_TABLET | Freq: Every day | ORAL | Status: DC
  Administered 2024-04-29: 81 mg via ORAL
  Filled 2024-04-29: qty 1

## 2024-04-29 MED ORDER — CLOPIDOGREL BISULFATE 75 MG PO TABS
75.0000 mg | ORAL_TABLET | Freq: Every day | ORAL | Status: DC
  Administered 2024-04-29: 75 mg via ORAL
  Filled 2024-04-29: qty 1

## 2024-04-29 MED ORDER — TAMSULOSIN HCL 0.4 MG PO CAPS
0.4000 mg | ORAL_CAPSULE | Freq: Every day | ORAL | Status: DC
  Administered 2024-04-29: 0.4 mg via ORAL
  Filled 2024-04-29: qty 1

## 2024-04-29 NOTE — ED Provider Notes (Addendum)
-----------------------------------------   11:02 AM on 04/29/2024 ----------------------------------------- Patient received Ativan , states Ativan  makes him nauseated.  He last received Zofran  around 4:00 in the morning (approximately 5 hours ago).  Patient did receive Zofran  prior to that as well.  I reviewed the patient's recent EKG from 04/19/2024 showing a QTc of 426.  Will dose an additional dose of Zofran  for the patient's nausea and continue to closely monitor.   Dorothyann Drivers, MD 04/29/24 1103  ----------------------------------------- 2:20 PM on 04/29/2024 -----------------------------------------   Patient has been seen and evaluated by psychiatry.  They believe the patient safe for discharge home from a psychiatric standpoint and does not need inpatient psychiatric treatment.  Will provide R[facility] in RTS follow-up resources for the patient.  Patient is on home narcotics, we will dose a one-time dose now to avoid withdrawal, will have the patient follow-up with his doctor.  Patient is also requesting a dose of his alprazolam  which she takes 3 times daily with his pain medication.  Will dose 1 mg of alprazolam .  Patient will work on getting a ride home.   Dorothyann Drivers, MD 04/29/24 1421    Dorothyann Drivers, MD 04/29/24 1425

## 2024-04-29 NOTE — BH Assessment (Signed)
 TTS unable to assess due to patient's level of intoxification and IM administration.  Psych team will assess at a later time.

## 2024-04-29 NOTE — ED Notes (Signed)
 Pt vomited up Oral ativan , MD approved 2mg  IM

## 2024-04-29 NOTE — ED Notes (Signed)
 Lunch tray provided to pt.

## 2024-04-29 NOTE — ED Notes (Signed)
 Shower supplies provided to pt

## 2024-04-29 NOTE — ED Provider Notes (Signed)
 Emergency Medicine Observation Re-evaluation Note  Joel Howe is a 68 y.o. male, seen on rounds today.  Pt initially presented to the ED for complaints of Psychiatric Evaluation Currently, the patient is resting comfortably.  Physical Exam  BP (!) 168/106 (BP Location: Left Arm)   Pulse 80   Temp 98.4 F (36.9 C) (Oral)   Resp 18   Ht 6' 1 (1.854 m)   Wt 74.4 kg   SpO2 97%   BMI 21.64 kg/m  Physical Exam General: No acute distress  ED Course / MDM  EKG:   I have reviewed the labs performed to date as well as medications administered while in observation.  Recent changes in the last 24 hours include no acute events.  Plan  Current plan is for psychiatric evaluation.    Malvina Alm DASEN, MD 04/29/24 [phone]
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
## 2024-04-29 NOTE — ED Notes (Signed)
Breakfast tray delivered to pt.

## 2024-05-08 ENCOUNTER — Encounter (HOSPITAL_COMMUNITY): Payer: Self-pay

## 2024-05-08 ENCOUNTER — Other Ambulatory Visit: Payer: Self-pay

## 2024-05-08 ENCOUNTER — Emergency Department (HOSPITAL_COMMUNITY)
Admission: EM | Admit: 2024-05-08 | Discharge: 2024-05-09 | Disposition: A | Discharge status: Home / Self Care | Attending: Emergency Medicine | Admitting: Emergency Medicine

## 2024-05-08 DIAGNOSIS — R4689 Other symptoms and signs involving appearance and behavior: Secondary | ICD-10-CM

## 2024-05-08 DIAGNOSIS — Z7982 Long term (current) use of aspirin: Secondary | ICD-10-CM | POA: Insufficient documentation

## 2024-05-08 DIAGNOSIS — S0990XA Unspecified injury of head, initial encounter: Secondary | ICD-10-CM | POA: Diagnosis not present

## 2024-05-08 DIAGNOSIS — F1910 Other psychoactive substance abuse, uncomplicated: Secondary | ICD-10-CM | POA: Diagnosis not present

## 2024-05-08 DIAGNOSIS — Z79899 Other long term (current) drug therapy: Secondary | ICD-10-CM | POA: Insufficient documentation

## 2024-05-08 DIAGNOSIS — F29 Unspecified psychosis not due to a substance or known physiological condition: Secondary | ICD-10-CM | POA: Diagnosis not present

## 2024-05-08 DIAGNOSIS — R456 Violent behavior: Secondary | ICD-10-CM | POA: Diagnosis not present

## 2024-05-08 LAB — COMPREHENSIVE METABOLIC PANEL WITH GFR
ALT: 15 U/L (ref 0–44)
AST: 28 U/L (ref 15–41)
Albumin: 4.6 g/dL (ref 3.5–5.0)
Alkaline Phosphatase: 59 U/L (ref 38–126)
Anion gap: 10 (ref 5–15)
BUN: 14 mg/dL (ref 8–23)
CO2: 26 mmol/L (ref 22–32)
Calcium: 9.3 mg/dL (ref 8.9–10.3)
Chloride: 97 mmol/L — ABNORMAL LOW (ref 98–111)
Creatinine, Ser: 0.88 mg/dL (ref 0.61–1.24)
GFR, Estimated: >60 mL/min (ref >60)
Glucose, Bld: 173 mg/dL — ABNORMAL HIGH (ref 70–99)
Potassium: 4.3 mmol/L (ref 3.5–5.1)
Sodium: 133 mmol/L — ABNORMAL LOW (ref 135–145)
Total Bilirubin: 1 mg/dL (ref 0.0–1.2)
Total Protein: 7.9 g/dL (ref 6.5–8.1)

## 2024-05-08 LAB — CBC WITH DIFFERENTIAL/PLATELET
Abs Immature Granulocytes: 0.04 K/uL (ref 0.00–0.07)
Basophils Absolute: 0 K/uL (ref 0.0–0.1)
Basophils Relative: 0 %
Eosinophils Absolute: 0 K/uL (ref 0.0–0.5)
Eosinophils Relative: 0 %
HCT: 46.9 % (ref 39.0–52.0)
Hemoglobin: 16 g/dL (ref 13.0–17.0)
Immature Granulocytes: 0 %
Lymphocytes Relative: 14 %
Lymphs Abs: 1.6 K/uL (ref 0.7–4.0)
MCH: 32.9 pg (ref 26.0–34.0)
MCHC: 34.1 g/dL (ref 30.0–36.0)
MCV: 96.3 fL (ref 80.0–100.0)
Monocytes Absolute: 0.7 K/uL (ref 0.1–1.0)
Monocytes Relative: 6 %
Neutro Abs: 9.3 K/uL — ABNORMAL HIGH (ref 1.7–7.7)
Neutrophils Relative %: 80 %
Platelets: 238 K/uL (ref 150–400)
RBC: 4.87 MIL/uL (ref 4.22–5.81)
RDW: 12.5 % (ref 11.5–15.5)
WBC: 11.8 K/uL — ABNORMAL HIGH (ref 4.0–10.5)
nRBC: 0 % (ref 0.0–0.2)

## 2024-05-08 LAB — RAPID URINE DRUG SCREEN, HOSP PERFORMED
Amphetamines: NOT DETECTED
Barbiturates: POSITIVE — AB
Benzodiazepines: POSITIVE — AB
Cocaine: NOT DETECTED
Opiates: NOT DETECTED
Tetrahydrocannabinol: POSITIVE — AB

## 2024-05-08 LAB — ETHANOL: Alcohol, Ethyl (B): <15 mg/dL (ref <15)

## 2024-05-08 MED ORDER — CLONAZEPAM 0.5 MG PO TABS
1.0000 mg | ORAL_TABLET | Freq: Once | ORAL | Status: AC
  Administered 2024-05-08: 1 mg via ORAL
  Filled 2024-05-08: qty 2

## 2024-05-08 NOTE — ED Provider Triage Note (Signed)
 Emergency Medicine Provider Triage Evaluation Note  Joel Howe , a 68 y.o. male  was evaluated in triage.  Pt complains of patient presents from a psych facility for evaluation.  Patient was doing well until today when he became aggressive.  He does endorse hallucinations.  He states he is seeing and hearing things.  He states that he saw crosshairs when he was out in the emergency department.  He had locked himself in a room at the facility he was at prior to arrival.  His wife went and picked him up.  He is voluntarily willing to stay for [facility] eval.  He denies SI or HI.  Wife states he is a recovering addict and alcoholic.  His last drink was Thursday.  She states he had a bad year and he relapsed and then he checked himself into the facility at [facility].  Review of Systems  Positive: As above Negative: As above  Physical Exam  BP (!) 159/95 (BP Location: Right Arm)   Pulse (!) 44   Temp 98.2 F (36.8 C)   Resp 18   Ht 6' 1 (1.854 m)   Wt 72.6 kg   SpO2 91%   BMI 21.11 kg/m  Gen:   Awake, no distress   Resp:  Normal effort  MSK:   Moves extremities without difficulty  Other:    Medical Decision Making  Medically screening exam initiated at 3:25 PM.  Appropriate orders placed.  Joel Howe was informed that the remainder of the evaluation will be completed by another provider, this initial triage assessment does not replace that evaluation, and the importance of remaining in the ED until their evaluation is complete.    Joel Loge, PA-C 05/08/24 1526
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
## 2024-05-08 NOTE — ED Triage Notes (Addendum)
 POV/ brought in by family/ ambulatory/ sent by rehab facility d/t not cooperating, disorientation, agitation/ pt at rehab x3 days for alcoholism/ A&Ox3

## 2024-05-08 NOTE — ED Provider Notes (Signed)
 [place] EMERGENCY DEPARTMENT AT [facility] Provider Note   CSN: [id number] Arrival date & time: 05/08/24  1445     Patient presents with: Psychiatric Evaluation   Joel Howe is a 68 y.o. male is a of alcohol abuse, here presenting with aggressive behavior.  Patient last alcohol use was last Thursday.  He checked himself to [facility] on Friday.  Patient states that he has been sober for the last 3 days.  Patient was doing well until this morning.  He states that he suddenly became aggressive.  He was seeing things that were not there.  He then locked himself in the room.  He tried to check himself out and the facility call the wife to pick him up.  Patient states that he feels slightly tremulous.  He denies any auditory hallucination currently.  He denies any seizure activity.  He is on phenobarb and Klonopin  at the facility.  Denies any previous psychiatric disorders such as schizophrenia or bipolar.   The history is provided by the patient.       Prior to Admission medications   Medication Sig Start Date End Date Taking? Authorizing Provider  albuterol  (VENTOLIN  HFA) 108 (90 Base) MCG/ACT inhaler Inhale 2 puffs into the lungs every 6 (six) hours as needed for wheezing or shortness of breath. 09/29/23   Orlean Alan HERO, FNP  ascorbic acid  (VITAMIN C ) 500 MG tablet Take 500 mg by mouth daily.    [provider]  aspirin  EC 81 MG tablet Take 81 mg by mouth daily. Swallow whole.    [provider]  clopidogrel  (PLAVIX ) 75 MG tablet Take 1 tablet (75 mg total) by mouth daily. 12/17/23   Orlean Alan HERO, FNP  cyclobenzaprine  (FLEXERIL ) 10 MG tablet Take 10 mg by mouth 3 (three) times daily as needed for muscle spasms.    [provider]  escitalopram  (LEXAPRO ) 20 MG tablet Take 1 tablet (20 mg total) by mouth daily. 12/17/23   Orlean Alan HERO, FNP  magnesium  oxide (MAG-OX) 400 MG tablet Take 1 tablet (400 mg total) by mouth daily. 03/20/24   Darron Deatrice LABOR, MD  Melatonin 10 MG TABS Take 10 mg by mouth at bedtime.    [provider]  Multiple Vitamins-Minerals (CENTRUM SILVER  50+MEN) TABS Take 1 tablet by mouth daily. 08/23/20   [provider]  nitroGLYCERIN  (NITROSTAT ) 0.4 MG SL tablet Place 1 tablet (0.4 mg total) under the tongue every 5 (five) minutes as needed for chest pain. 03/31/24 06/29/24  Vivienne Lonni Ingle, NP  rosuvastatin  (CRESTOR ) 40 MG tablet TAKE 1 TABLET BY MOUTH EVERY DAY AT BEDTIME 12/21/23   Fernand Fredy RAMAN, MD  sacubitril -valsartan  (ENTRESTO ) 49-51 MG Take 1 tablet by mouth 2 (two) times daily. 03/31/24   Vivienne Lonni Ingle, NP  tamsulosin  (FLOMAX ) 0.4 MG CAPS capsule Take 1 capsule (0.4 mg total) by mouth daily. 11/20/23   Orlean Alan HERO, FNP    Allergies: Beef-derived drug products, Morphine  and codeine, and Propranolol    Review of Systems  Psychiatric/Behavioral:  Positive for hallucinations.   All other systems reviewed and are negative.   Updated Vital Signs BP (!) 183/73   Pulse 86   Temp 98.2 F (36.8 C) (Oral)   Resp 15   Ht 6' 1 (1.854 m)   Wt 72.6 kg   SpO2 99%   BMI 21.11 kg/m   Physical Exam Vitals and nursing note reviewed.  Constitutional:      Appearance: Normal  appearance.  HENT:     Head: Normocephalic.     Nose: Nose normal.     Mouth/Throat:     Mouth: Mucous membranes are moist.  Eyes:     Extraocular Movements: Extraocular movements intact.     Pupils: Pupils are equal, round, and reactive to light.  Cardiovascular:     Rate and Rhythm: Normal rate and regular rhythm.     Pulses: Normal pulses.     Heart sounds: Normal heart sounds.  Pulmonary:     Effort: Pulmonary effort is normal.     Breath sounds: Normal breath sounds.  Abdominal:     General: Abdomen is flat.     Palpations: Abdomen is soft.  Musculoskeletal:        General: Normal range of motion.     Cervical back: Normal range of motion and neck supple.  Skin:    General: Skin is  warm.     Capillary Refill: Capillary refill takes less than 2 seconds.  Neurological:     General: No focal deficit present.     Mental Status: He is alert and oriented to person, place, and time.  Psychiatric:     Comments: Patient is not responding to internal stimuli but patient appears anxious     (all labs ordered are listed, but only abnormal results are displayed) Labs Reviewed  CBC WITH DIFFERENTIAL/PLATELET - Abnormal; Notable for the following components:      Result Value   WBC 11.8 (*)    Neutro Abs 9.3 (*)    All other components within normal limits  COMPREHENSIVE METABOLIC PANEL WITH GFR  ETHANOL  RAPID URINE DRUG SCREEN, HOSP PERFORMED    EKG: None  Radiology: No results found.   Procedures   Medications Ordered in the ED - No data to display                                  Medical Decision Making Joel Howe is a 68 y.o. male here presenting with hallucination and aggressive behavior.  Wonder if he is withdrawing from alcohol or this is a primary psychiatric issue.  Patient is calm currently and slightly anxious. Will get psych clearance labs and consult TTS.  8:58 PM I reviewed patient's labs and they were unremarkable.  UDS is positive for benzos and barbiturates and marijuana.  Patient is medically cleared for psych eval  Amount and/or Complexity of Data Reviewed Labs: ordered.  Risk Prescription drug management.     Final diagnoses:  None    ED Discharge Orders     None          Patt Alm Macho, MD 05/08/24 2309

## 2024-05-09 ENCOUNTER — Emergency Department (HOSPITAL_COMMUNITY)

## 2024-05-09 DIAGNOSIS — S0990XA Unspecified injury of head, initial encounter: Secondary | ICD-10-CM | POA: Diagnosis not present

## 2024-05-09 NOTE — ED Provider Notes (Signed)
 Patient was seen for alcohol abuse and aggressive behavior.  He has been seen by TTS and cleared psychiatrically.  He contracts for safety.  He denies suicidal thoughts or homicidal thoughts.  No hallucinations currently.  No evidence of life-threatening alcohol withdrawal currently. CIWA score is 1.   CT head negative.  Patient calm and cooperative.  No evidence of life-threatening alcohol withdrawal.  He appears stable for discharge.   Carita Senior, MD 05/09/24 [phone]

## 2024-05-09 NOTE — ED Notes (Signed)
 PT D/C'D AFTER INSTRUCTIONS REVIEWED. PT VERBALIZED UNDERSTANDING. NAD REPORTED OR NOTED AT THIS TIME.

## 2024-05-09 NOTE — Discharge Instructions (Addendum)
 Take your medications as prescribed.  Use Xanax  as needed for alcohol withdrawal.  You should not drink if you are taking presently.  Follow-up with your primary doctor.  Return to the ED with new or worsening symptoms

## 2024-05-12 ENCOUNTER — Ambulatory Visit: Admitting: Nurse Practitioner

## 2024-05-15 DIAGNOSIS — F331 Major depressive disorder, recurrent, moderate: Secondary | ICD-10-CM | POA: Diagnosis not present

## 2024-05-23 ENCOUNTER — Ambulatory Visit: Admitting: Family

## 2024-05-24 ENCOUNTER — Ambulatory Visit: Admitting: Cardiovascular Disease

## 2024-05-25 ENCOUNTER — Ambulatory Visit: Admitting: Nurse Practitioner

## 2024-05-26 DIAGNOSIS — F331 Major depressive disorder, recurrent, moderate: Secondary | ICD-10-CM | POA: Diagnosis not present

## 2024-05-31 DIAGNOSIS — F331 Major depressive disorder, recurrent, moderate: Secondary | ICD-10-CM | POA: Diagnosis not present

## 2024-06-07 DIAGNOSIS — F331 Major depressive disorder, recurrent, moderate: Secondary | ICD-10-CM | POA: Diagnosis not present

## 2024-06-11 ENCOUNTER — Other Ambulatory Visit: Payer: Self-pay | Admitting: Family

## 2024-06-14 DIAGNOSIS — F331 Major depressive disorder, recurrent, moderate: Secondary | ICD-10-CM | POA: Diagnosis not present

## 2024-06-21 DIAGNOSIS — F331 Major depressive disorder, recurrent, moderate: Secondary | ICD-10-CM | POA: Diagnosis not present

## 2024-06-26 ENCOUNTER — Ambulatory Visit (INDEPENDENT_AMBULATORY_CARE_PROVIDER_SITE_OTHER): Admitting: Family

## 2024-06-26 VITALS — BP 152/98 | HR 73 | Ht 73.0 in | Wt 161.2 lb

## 2024-06-26 DIAGNOSIS — I10 Essential (primary) hypertension: Secondary | ICD-10-CM

## 2024-06-26 DIAGNOSIS — Z20822 Contact with and (suspected) exposure to covid-19: Secondary | ICD-10-CM | POA: Diagnosis not present

## 2024-06-26 DIAGNOSIS — J069 Acute upper respiratory infection, unspecified: Secondary | ICD-10-CM | POA: Diagnosis not present

## 2024-06-26 LAB — POCT XPERT XPRESS SARS COVID-2/FLU/RSV
FLU A: NEGATIVE
FLU B: NEGATIVE
RSV RNA, PCR: NEGATIVE
SARS Coronavirus 2: NEGATIVE

## 2024-06-26 NOTE — Progress Notes (Unsigned)
 Established Patient Office Visit  Subjective:  [name], male    DOB: Sep 19, 1956  Age: 68 y.o. MRN: [id number]  Chief Complaint  Patient presents with   Follow-up    Medication follow up    Patient is here today for his  follow up.  He has been feeling  since last appointment.   He does have additional concerns to discuss today.  He was recently exposed to a known case of COVID (his wife) and now has started to have symptoms, including fever, cough, chills, and fatigue.   Labs are not due today.  He needs refills.   I have reviewed his active problem list, medication list, allergies, notes from last encounter, lab results for his appointment today.      No other concerns at this time.   Past Medical History:  Diagnosis Date   Anginal pain (HCC)    Anxiety    Arthritis    Chronic back pain    Coronary artery disease    a. 2007 CABG ([facility]): LIMA to LAD, SVG to OM, and RIMA to RPDA; b. 04/2015 Cath: LM nl, LAD 14m, LCX ok, OM1 100, RCA ok, RPDA 80, Inf septal 70, LIMA->LAD ok, VG->OM1 ok, RIMA->RPDA 50-60ost-->med rx; c. 03/2024 PCI: LM 40ost/m, LAD 162m, D1 95, D2 80, LCX 30ost/p, OM2 80, OM3 100, RCA 100/pm, 77m/d, VG->OM2 mild dzs, LIMA->LAD ok, RIMA->RPDA 90p (3.5x15 Onyx Frontier DES).   HFrEF (heart failure with reduced ejection fraction) (HCC)    a. 03/2024 Echo: EF 30-35%, glob HK, GrI DD, mildly reduced RV fxn, mild MR, AoV sclerosis.   Hyperlipidemia    Hyperlipidemia LDL goal <70    Hypertension    Ischemic cardiomyopathy    a. 03/2024 Echo: EF 30-35%.   Lyme disease    Multilevel degenerative disc disease    Myocardial infarction (HCC) 12/2005   PAD (peripheral artery disease) (HCC)    a. 01/2023 Peripheral Angio: <30% celiac stenosis, no SMA stenosis, patent IMA.   PVC's (premature ventricular contractions)    a. 02/2024 Zio: Predominantly sinus rhythm, average 86 (62-154).  2 SVT runs, fastest 154 x 9 beats, longest 8 beats at 120.  3.1% PAC  burden.  8.1% PVC burden.   Tobacco use     Past Surgical History:  Procedure Laterality Date   CARDIAC CATHETERIZATION N/A 05/07/2015   Procedure: Left Heart Cath;  Surgeon: Denyse DELENA Bathe, MD;  Location: [facility];  Service: Cardiovascular;  Laterality: N/A;   COLONOSCOPY WITH PROPOFOL  N/A 11/18/2020   Procedure: COLONOSCOPY WITH BIOPSY;  Surgeon: Jinny Carmine, MD;  Location: [facility];  Service: Endoscopy;  Laterality: N/A;   CORONARY ANGIOPLASTY     CORONARY ARTERY BYPASS GRAFT  02/2006   4 vessel.  [facility]   CORONARY STENT INTERVENTION N/A 04/10/2024   Procedure: CORONARY STENT INTERVENTION;  Surgeon: Darron Deatrice DELENA, MD;  Location: [facility];  Service: Cardiovascular;  Laterality: N/A;   ESOPHAGOGASTRODUODENOSCOPY (EGD) WITH PROPOFOL  N/A 12/04/2022   Procedure: ESOPHAGOGASTRODUODENOSCOPY (EGD) WITH PROPOFOL ;  Surgeon: Jinny Carmine, MD;  Location: [facility];  Service: Endoscopy;  Laterality: N/A;   POLYPECTOMY N/A 11/18/2020   Procedure: POLYPECTOMY;  Surgeon: Jinny Carmine, MD;  Location: [facility];  Service: Endoscopy;  Laterality: N/A;   RIGHT/LEFT HEART CATH AND CORONARY ANGIOGRAPHY Bilateral 04/10/2024   Procedure: RIGHT/LEFT HEART CATH AND CORONARY ANGIOGRAPHY;  Surgeon: Darron Deatrice DELENA, MD;  Location: [facility];  Service:  Cardiovascular;  Laterality: Bilateral;   VISCERAL ANGIOGRAPHY N/A 01/21/2023   Procedure: VISCERAL ANGIOGRAPHY;  Surgeon: Marea Selinda RAMAN, MD;  Location: [facility];  Service: Cardiovascular;  Laterality: N/A;    Social History   Socioeconomic History   Marital status: Married    Spouse name: Not on file   Number of children: Not on file   Years of education: Not on file   Highest education level: Not on file  Occupational History   Not on file  Tobacco Use   Smoking status: Every Day    Current packs/day: 0.00    Average packs/day: 0.5 packs/day for 45.0 years (22.5 ttl pk-yrs)     Types: Cigarettes    Start date: 07/10/1975    Last attempt to quit: 07/09/2020    Years since quitting: 3.9   Smokeless tobacco: Never  Vaping Use   Vaping status: Every Day   Start date: 07/12/2020   Substances: Nicotine-salt  Substance and Sexual Activity   Alcohol use: No   Drug use: No   Sexual activity: Not on file  Other Topics Concern   Not on file  Social History Narrative   Not on file   Social Drivers of Health   Financial Resource Strain: Not on file  Food Insecurity: Not on file  Transportation Needs: Not on file  Physical Activity: Not on file  Stress: Not on file  Social Connections: Not on file  Intimate Partner Violence: Not on file    Family History  Problem Relation Age of Onset   Heart attack Mother    Heart attack Father    Heart attack Sister    Heart attack Brother    Heart attack Brother    Migraines Daughter    Hyperlipidemia Daughter    Anxiety disorder Daughter     Allergies  Allergen Reactions   Beef-Derived Drug Products Hives and Itching   Morphine  And Codeine Other (See Comments)    Headaches and creepy skin crawling (11/07/20 - pt currently taking without issues)   Propranolol Hives    Review of Systems  Constitutional:  Positive for chills, fever and malaise/fatigue.  HENT:  Positive for congestion.   Respiratory:  Positive for cough.   All other systems reviewed and are negative.      Objective:   BP (!) 152/98   Pulse 73   Ht 6' 1 (1.854 m)   Wt 161 lb 3.2 oz (73.1 kg)   SpO2 94%   BMI 21.27 kg/m   Vitals:   06/26/24 1508  BP: (!) 152/98  Pulse: 73  Height: 6' 1 (1.854 m)  Weight: 161 lb 3.2 oz (73.1 kg)  SpO2: 94%  BMI (Calculated): 21.27    Physical Exam Vitals and nursing note reviewed.  Constitutional:      Appearance: Normal appearance. He is normal weight.  Eyes:     Pupils: Pupils are equal, round, and reactive to light.  Cardiovascular:     Rate and Rhythm: Normal rate and regular rhythm.      Pulses: Normal pulses.     Heart sounds: Normal heart sounds.  Pulmonary:     Effort: Pulmonary effort is normal.     Breath sounds: Normal breath sounds.  Neurological:     General: No focal deficit present.     Mental Status: He is alert and oriented to person, place, and time. Mental status is at baseline.  Psychiatric:        Mood and Affect:  Mood normal.        Behavior: Behavior normal.        Thought Content: Thought content normal.        Judgment: Judgment normal.      No results found for any visits on 06/26/24.  Recent Results (from the past 2160 hours)  Basic metabolic panel with GFR     Status: Abnormal   Collection Time: 03/31/24 10:48 AM  Result Value Ref Range   Glucose 89 70 - 99 mg/dL   BUN 12 8 - 27 mg/dL   Creatinine, Ser 9.16 0.76 - 1.27 mg/dL   eGFR 97 >40 fO/fpw/8.26   BUN/Creatinine Ratio 14 10 - 24   Sodium 137 134 - 144 mmol/L   Potassium 5.4 (H) 3.5 - 5.2 mmol/L   Chloride 96 96 - 106 mmol/L   CO2 24 20 - 29 mmol/L   Calcium  9.2 8.6 - 10.2 mg/dL  CBC     Status: Abnormal   Collection Time: 03/31/24 10:48 AM  Result Value Ref Range   WBC 8.3 3.4 - 10.8 x10E3/uL   RBC 4.54 4.14 - 5.80 x10E6/uL   Hemoglobin 15.3 13.0 - 17.7 g/dL   Hematocrit 53.9 62.4 - 51.0 %   MCV 101 (H) 79 - 97 fL   MCH 33.7 (H) 26.6 - 33.0 pg   MCHC 33.3 31.5 - 35.7 g/dL   RDW 87.5 88.3 - 84.5 %   Platelets 173 150 - 450 x10E3/uL  Magnesium      Status: None   Collection Time: 03/31/24 10:48 AM  Result Value Ref Range   Magnesium  2.3 1.6 - 2.3 mg/dL  Basic metabolic panel with GFR     Status: Abnormal   Collection Time: 04/05/24  5:43 PM  Result Value Ref Range   Sodium 135 135 - 145 mmol/L   Potassium 4.3 3.5 - 5.1 mmol/L   Chloride 100 98 - 111 mmol/L   CO2 29 22 - 32 mmol/L   Glucose, Bld 112 (H) 70 - 99 mg/dL    Comment: Glucose reference range applies only to samples taken after fasting for at least 8 hours.   BUN 16 8 - 23 mg/dL   Creatinine, Ser 9.25 0.61  - 1.24 mg/dL   Calcium  9.1 8.9 - 10.3 mg/dL   GFR, Estimated >39 >39 mL/min    Comment: (NOTE) Calculated using the CKD-EPI Creatinine Equation (2021)    Anion gap 6 5 - 15    Comment: Performed at [facility], [address] [place], [place] [address]  I-STAT 7, (LYTES, BLD GAS, ICA, H+H)     Status: Abnormal   Collection Time: 04/10/24 11:50 AM  Result Value Ref Range   pH, Arterial 7.370 7.35 - 7.45   pCO2 arterial 42.3 32 - 48 mmHg   pO2, Arterial 79 (L) 83 - 108 mmHg   Bicarbonate 24.5 20.0 - 28.0 mmol/L   TCO2 26 22 - 32 mmol/L   O2 Saturation 95 %   Acid-base deficit 1.0 0.0 - 2.0 mmol/L   Sodium 139 135 - 145 mmol/L   Potassium 4.0 3.5 - 5.1 mmol/L   Calcium , Ion 1.20 1.15 - 1.40 mmol/L   HCT 45.0 39.0 - 52.0 %   Hemoglobin 15.3 13.0 - 17.0 g/dL   Sample type ARTERIAL   POCT I-Stat EG7     Status: None   Collection Time: 04/10/24 11:51 AM  Result Value Ref Range   pH, Ven 7.341 7.25 - 7.43  pCO2, Ven 49.8 44 - 60 mmHg   pO2, Ven 34 32 - 45 mmHg   Bicarbonate 26.9 20.0 - 28.0 mmol/L   TCO2 28 22 - 32 mmol/L   O2 Saturation 62 %   Acid-Base Excess 0.0 0.0 - 2.0 mmol/L   Sodium 139 135 - 145 mmol/L   Potassium 4.0 3.5 - 5.1 mmol/L   Calcium , Ion 1.24 1.15 - 1.40 mmol/L   HCT 45.0 39.0 - 52.0 %   Hemoglobin 15.3 13.0 - 17.0 g/dL   Sample type MIXED VENOUS SAMPLE    Comment NOTIFIED PHYSICIAN   POCT Activated clotting time     Status: None   Collection Time: 04/10/24 12:18 PM  Result Value Ref Range   Activated Clotting Time 412 seconds    Comment: Reference range 74-137 seconds for patients not on anticoagulant therapy.  Comprehensive metabolic panel     Status: Abnormal   Collection Time: 04/28/24  6:52 PM  Result Value Ref Range   Sodium 135 135 - 145 mmol/L   Potassium 3.8 3.5 - 5.1 mmol/L    Comment: HEMOLYSIS AT THIS LEVEL MAY AFFECT RESULT   Chloride 100 98 - 111 mmol/L   CO2 20 (L) 22 - 32 mmol/L   Glucose, Bld 160 (H) 70 - 99 mg/dL     Comment: Glucose reference range applies only to samples taken after fasting for at least 8 hours.   BUN 5 (L) 8 - 23 mg/dL   Creatinine, Ser 9.32 0.61 - 1.24 mg/dL   Calcium  9.2 8.9 - 10.3 mg/dL   Total Protein 8.0 6.5 - 8.1 g/dL   Albumin 4.4 3.5 - 5.0 g/dL   AST 28 15 - 41 U/L    Comment: HEMOLYSIS AT THIS LEVEL MAY AFFECT RESULT   ALT 13 0 - 44 U/L    Comment: HEMOLYSIS AT THIS LEVEL MAY AFFECT RESULT   Alkaline Phosphatase 53 38 - 126 U/L   Total Bilirubin 1.0 0.0 - 1.2 mg/dL    Comment: HEMOLYSIS AT THIS LEVEL MAY AFFECT RESULT   GFR, Estimated >60 >60 mL/min    Comment: (NOTE) Calculated using the CKD-EPI Creatinine Equation (2021)    Anion gap 15 5 - 15    Comment: Performed at [facility], [address] [place], [place] [address]  Ethanol     Status: Abnormal   Collection Time: 04/28/24  6:52 PM  Result Value Ref Range   Alcohol, Ethyl (B) 247 (H) <15 mg/dL    Comment: (NOTE) For medical purposes only. Performed at [facility], [address] [place], [place] [address]   cbc     Status: None   Collection Time: 04/28/24  6:52 PM  Result Value Ref Range   WBC 9.9 4.0 - 10.5 K/uL   RBC 5.08 4.22 - 5.81 MIL/uL   Hemoglobin 16.9 13.0 - 17.0 g/dL   HCT 52.6 60.9 - 47.9 %   MCV 93.1 80.0 - 100.0 fL   MCH 33.3 26.0 - 34.0 pg   MCHC 35.7 30.0 - 36.0 g/dL   RDW 87.8 88.4 - 84.4 %   Platelets 229 150 - 400 K/uL   nRBC 0.0 0.0 - 0.2 %    Comment: Performed at [facility], [address] [place], [place] [address]  CBG monitoring, ED     Status: Abnormal   Collection Time: 04/28/24 11:11 PM  Result Value Ref Range   Glucose-Capillary 111 (H) 70 - 99 mg/dL  Comment: Glucose reference range applies only to samples taken after fasting for at least 8 hours.  CBC with Differential     Status: Abnormal   Collection Time: 05/08/24  7:27 PM  Result Value Ref Range   WBC 11.8 (H) 4.0 - 10.5 K/uL   RBC 4.87 4.22 - 5.81 MIL/uL   Hemoglobin  16.0 13.0 - 17.0 g/dL   HCT 53.0 60.9 - 47.9 %   MCV 96.3 80.0 - 100.0 fL   MCH 32.9 26.0 - 34.0 pg   MCHC 34.1 30.0 - 36.0 g/dL   RDW 87.4 88.4 - 84.4 %   Platelets 238 150 - 400 K/uL   nRBC 0.0 0.0 - 0.2 %   Neutrophils Relative % 80 %   Neutro Abs 9.3 (H) 1.7 - 7.7 K/uL   Lymphocytes Relative 14 %   Lymphs Abs 1.6 0.7 - 4.0 K/uL   Monocytes Relative 6 %   Monocytes Absolute 0.7 0.1 - 1.0 K/uL   Eosinophils Relative 0 %   Eosinophils Absolute 0.0 0.0 - 0.5 K/uL   Basophils Relative 0 %   Basophils Absolute 0.0 0.0 - 0.1 K/uL   Immature Granulocytes 0 %   Abs Immature Granulocytes 0.04 0.00 - 0.07 K/uL    Comment: Performed at [facility], [address], [place], [place] [address]  Comprehensive metabolic panel     Status: Abnormal   Collection Time: 05/08/24  7:27 PM  Result Value Ref Range   Sodium 133 (L) 135 - 145 mmol/L   Potassium 4.3 3.5 - 5.1 mmol/L   Chloride 97 (L) 98 - 111 mmol/L   CO2 26 22 - 32 mmol/L   Glucose, Bld 173 (H) 70 - 99 mg/dL    Comment: Glucose reference range applies only to samples taken after fasting for at least 8 hours.   BUN 14 8 - 23 mg/dL   Creatinine, Ser 9.11 0.61 - 1.24 mg/dL   Calcium  9.3 8.9 - 10.3 mg/dL   Total Protein 7.9 6.5 - 8.1 g/dL   Albumin 4.6 3.5 - 5.0 g/dL   AST 28 15 - 41 U/L   ALT 15 0 - 44 U/L   Alkaline Phosphatase 59 38 - 126 U/L   Total Bilirubin 1.0 0.0 - 1.2 mg/dL   GFR, Estimated >39 >39 mL/min    Comment: (NOTE) Calculated using the CKD-EPI Creatinine Equation (2021)    Anion gap 10 5 - 15    Comment: Performed at [facility], [address] [place], [place] [address]  Ethanol     Status: None   Collection Time: 05/08/24  7:27 PM  Result Value Ref Range   Alcohol, Ethyl (B) <15 <15 mg/dL    Comment: (NOTE) For medical purposes only. Performed at [facility], [address], [place], [place] [address]   Rapid urine drug screen (hospital performed)     Status: Abnormal   Collection  Time: 05/08/24  7:29 PM  Result Value Ref Range   Opiates NONE DETECTED NONE DETECTED   Cocaine NONE DETECTED NONE DETECTED   Benzodiazepines POSITIVE (A) NONE DETECTED   Amphetamines NONE DETECTED NONE DETECTED   Tetrahydrocannabinol POSITIVE (A) NONE DETECTED   Barbiturates POSITIVE (A) NONE DETECTED    Comment: (NOTE) DRUG SCREEN FOR MEDICAL PURPOSES ONLY.  IF CONFIRMATION IS NEEDED FOR ANY PURPOSE, NOTIFY LAB WITHIN 5 DAYS.  LOWEST DETECTABLE LIMITS FOR URINE DRUG SCREEN Drug Class  Cutoff (ng/mL) Amphetamine and metabolites    1000 Barbiturate and metabolites    200 Benzodiazepine                 200 Opiates and metabolites        300 Cocaine and metabolites        300 THC                            50 Performed at [facility], [address], [place], [place] [address]        Assessment & Plan Exposure to confirmed case of COVID-19 COVID/Flu/RSV test performed today.  Will call with results when available.   May send meds related to results.    Will set up follow up once patient is feeling better.   Total time spent: 20 minutes  ALAN CHRISTELLA ARRANT, FNP  06/26/2024   This document may have been prepared by [facility] Voice Recognition software and as such may include unintentional dictation errors.

## 2024-06-28 ENCOUNTER — Encounter: Payer: Self-pay | Admitting: Family

## 2024-06-28 DIAGNOSIS — F331 Major depressive disorder, recurrent, moderate: Secondary | ICD-10-CM | POA: Diagnosis not present

## 2024-07-05 DIAGNOSIS — F331 Major depressive disorder, recurrent, moderate: Secondary | ICD-10-CM | POA: Diagnosis not present

## 2024-07-12 ENCOUNTER — Other Ambulatory Visit: Payer: Self-pay | Admitting: Nurse Practitioner

## 2024-07-12 DIAGNOSIS — F331 Major depressive disorder, recurrent, moderate: Secondary | ICD-10-CM | POA: Diagnosis not present

## 2024-07-19 DIAGNOSIS — F331 Major depressive disorder, recurrent, moderate: Secondary | ICD-10-CM | POA: Diagnosis not present

## 2024-07-26 DIAGNOSIS — F331 Major depressive disorder, recurrent, moderate: Secondary | ICD-10-CM | POA: Diagnosis not present

## 2024-08-02 DIAGNOSIS — F331 Major depressive disorder, recurrent, moderate: Secondary | ICD-10-CM | POA: Diagnosis not present

## 2024-08-10 DIAGNOSIS — M7021 Olecranon bursitis, right elbow: Secondary | ICD-10-CM | POA: Diagnosis not present

## 2024-08-22 DIAGNOSIS — M7021 Olecranon bursitis, right elbow: Secondary | ICD-10-CM | POA: Diagnosis not present

## 2024-08-25 ENCOUNTER — Telehealth: Payer: Self-pay | Admitting: Cardiovascular Disease

## 2024-08-25 MED ORDER — CARVEDILOL 25 MG PO TABS: 25.0000 mg | ORAL_TABLET | Freq: Two times a day (BID) | ORAL | 0 refills | Status: DC

## 2024-08-25 NOTE — Telephone Encounter (Signed)
 Pt c/o medication issue:  1. Name of Medication:   carvedilol  (COREG ) 25 MG tablet [[id number]]   2. How are you currently taking this medication (dosage and times per day)?   3. Are you having a reaction (difficulty breathing--STAT)?   4. What is your medication issue?   Patient wants a call back to discuss this medication.  Patient noted his BP has been running around 85-89 for the bottom number.

## 2024-08-25 NOTE — Telephone Encounter (Signed)
 The patient was calling to get a refill of Carvedilol . This was taken off of his list around 04/29/24 but the patient stated he has been taking it since then and is now currently out.   His pressures have been running in the 170/89 and heart rates in the 80/90's. Per Lonni Meager, NP is it okay to refill for a month.  He has been advised to keep his appointment made for 08/31/24 with Dr. Darron

## 2024-08-31 ENCOUNTER — Ambulatory Visit: Attending: Cardiovascular Disease | Admitting: Cardiovascular Disease

## 2024-08-31 ENCOUNTER — Encounter: Payer: Self-pay | Admitting: Cardiovascular Disease

## 2024-08-31 VITALS — BP 134/78 | HR 63 | Ht 73.0 in | Wt 173.2 lb

## 2024-08-31 DIAGNOSIS — I10 Essential (primary) hypertension: Secondary | ICD-10-CM | POA: Diagnosis not present

## 2024-08-31 DIAGNOSIS — I5022 Chronic systolic (congestive) heart failure: Secondary | ICD-10-CM | POA: Diagnosis not present

## 2024-08-31 DIAGNOSIS — I493 Ventricular premature depolarization: Secondary | ICD-10-CM | POA: Diagnosis not present

## 2024-08-31 DIAGNOSIS — Z720 Tobacco use: Secondary | ICD-10-CM

## 2024-08-31 DIAGNOSIS — I2510 Atherosclerotic heart disease of native coronary artery without angina pectoris: Secondary | ICD-10-CM

## 2024-08-31 DIAGNOSIS — E785 Hyperlipidemia, unspecified: Secondary | ICD-10-CM

## 2024-08-31 NOTE — Progress Notes (Signed)
 Cardiology Office Note   Date:  08/31/2024   ID:  Joel Howe 1956-03-30, MRN [id number]  PCP:  Orlean Alan HERO, FNP  Cardiologist:   Deatrice Cage, MD   Chief Complaint  Patient presents with   Follow-up    OD 1 month f/u c/o occasional elevated BP. Meds reviewed verbally with pt.      History of Present Illness: Joel Howe is a 68 y.o. male who presents for a follow-up visit regarding coronary artery disease and chronic systolic heart failure.    He has known history of coronary artery disease status post CABG in 2007 at [facility], hypertension, tobacco use, chronic pain previously on oxycodone , intermittent alcohol abuse and hyperlipidemia. Cardiac catheterization in 2016 showed significant three-vessel coronary artery disease with patent grafts including LIMA to LAD, SVG to OM and SVG to right PDA.  There was 50 to 60% stenosis in ostial SVG to right PDA. He was hospitalized in 2021 at [facility] with blood in stool and was suspected of having ischemic colitis.   He was seen in May with worsening shortness of breath, palpitations and PVCs noted on his EKG.  Outpatient monitor showed frequent PVCs with a burden of 8.1%. Echocardiogram showed a significant drop in his ejection fraction to 30 to 35% with mild mitral regurgitation.  He reported significant exertional chest pain at that time. I proceeded with cardiac catheterization in June which showed patent grafts including LIMA to LAD, SVG to OM 2 and SVG to right PDA.  However, there was significant progression of ostial disease affecting the graft to the right PDA.  I performed successful drug-eluting stent placement.  Right heart catheterization showed high normal filling pressure, minimal pulmonary hypertension and moderately reduced cardiac output.  His chest pain improved after that but unfortunately he struggled with oxycodone  addiction with aggressive behavior and then started drinking heavily.  He had 2 emergency room visits  in July for this and ultimately he attended outpatient rehab and has been oxycodone  free and he also stopped drinking completely.  He has been feeling great overall with no chest pain or shortness of breath.    Past Medical History:  Diagnosis Date   Anginal pain    Anxiety    Arthritis    Chronic back pain    Coronary artery disease    a. 2007 CABG ([facility]): LIMA to LAD, SVG to OM, and RIMA to RPDA; b. 04/2015 Cath: LM nl, LAD 27m, LCX ok, OM1 100, RCA ok, RPDA 80, Inf septal 70, LIMA->LAD ok, VG->OM1 ok, RIMA->RPDA 50-60ost-->med rx; c. 03/2024 PCI: LM 40ost/m, LAD 151m, D1 95, D2 80, LCX 30ost/p, OM2 80, OM3 100, RCA 100/pm, 72m/d, VG->OM2 mild dzs, LIMA->LAD ok, RIMA->RPDA 90p (3.5x15 Onyx Frontier DES).   HFrEF (heart failure with reduced ejection fraction) (HCC)    a. 03/2024 Echo: EF 30-35%, glob HK, GrI DD, mildly reduced RV fxn, mild MR, AoV sclerosis.   Hyperlipidemia    Hyperlipidemia LDL goal <70    Hypertension    Ischemic cardiomyopathy    a. 03/2024 Echo: EF 30-35%.   Lyme disease    Multilevel degenerative disc disease    Myocardial infarction (HCC) 12/2005   PAD (peripheral artery disease)    a. 01/2023 Peripheral Angio: <30% celiac stenosis, no SMA stenosis, patent IMA.   PVC's (premature ventricular contractions)    a. 02/2024 Zio: Predominantly sinus rhythm, average 86 (62-154).  2 SVT runs, fastest 154 x 9 beats, longest 8 beats  at 120.  3.1% PAC burden.  8.1% PVC burden.   Tobacco use     Past Surgical History:  Procedure Laterality Date   CARDIAC CATHETERIZATION N/A 05/07/2015   Procedure: Left Heart Cath;  Surgeon: Denyse DELENA Bathe, MD;  Location: [facility] INVASIVE CV LAB;  Service: Cardiovascular;  Laterality: N/A;   COLONOSCOPY WITH PROPOFOL  N/A 11/18/2020   Procedure: COLONOSCOPY WITH BIOPSY;  Surgeon: Jinny Carmine, MD;  Location: [facility];  Service: Endoscopy;  Laterality: N/A;   CORONARY ANGIOPLASTY     CORONARY ARTERY BYPASS GRAFT  02/2006   4 vessel.   [facility]   CORONARY STENT INTERVENTION N/A 04/10/2024   Procedure: CORONARY STENT INTERVENTION;  Surgeon: Darron Deatrice DELENA, MD;  Location: [facility] INVASIVE CV [facility];  Service: Cardiovascular;  Laterality: N/A;   ESOPHAGOGASTRODUODENOSCOPY (EGD) WITH PROPOFOL  N/A 12/04/2022   Procedure: ESOPHAGOGASTRODUODENOSCOPY (EGD) WITH PROPOFOL ;  Surgeon: Jinny Carmine, MD;  Location: [facility];  Service: Endoscopy;  Laterality: N/A;   POLYPECTOMY N/A 11/18/2020   Procedure: POLYPECTOMY;  Surgeon: Jinny Carmine, MD;  Location: [facility];  Service: Endoscopy;  Laterality: N/A;   RIGHT/LEFT HEART CATH AND CORONARY ANGIOGRAPHY Bilateral 04/10/2024   Procedure: RIGHT/LEFT HEART CATH AND CORONARY ANGIOGRAPHY;  Surgeon: Darron Deatrice DELENA, MD;  Location: [facility] INV[facility] CV [facility];  Service: Cardiovascular;  Laterality: Bilateral;   VISCERAL ANGIOGRAPHY N/A 01/21/2023   Procedure: VISCERAL ANGIOGRAPHY;  Surgeon: Marea Selinda RAMAN, MD;  Location: [facility] INV[facility] CV [facility];  Service: Cardiovascular;  Laterality: N/A;     Current Outpatient Medications  Medication Sig Dispense Refill   albuterol  (VENTOLIN  HFA) 108 (90 Base) MCG/ACT inhaler Inhale 2 puffs into the lungs every 6 (six) hours as needed for wheezing or shortness of breath. 8 g 2   alprazolam  (XANAX ) 2 MG tablet Take 2 mg by mouth 2 (two) times daily as needed.     ascorbic acid  (VITAMIN C ) 500 MG tablet Take 500 mg by mouth daily.     aspirin  EC 81 MG tablet Take 81 mg by mouth daily. Swallow whole.     carvedilol  (COREG ) 25 MG tablet Take 1 tablet (25 mg total) by mouth 2 (two) times daily. Keep appointment for future refills. 60 tablet 0   clopidogrel  (PLAVIX ) 75 MG tablet Take 1 tablet (75 mg total) by mouth daily. 90 tablet 1   cyclobenzaprine  (FLEXERIL ) 10 MG tablet Take 10 mg by mouth 3 (three) times daily as needed for muscle spasms.     escitalopram  (LEXAPRO ) 20 MG tablet TAKE 1 TABLET(20 MG) BY MOUTH DAILY (Patient taking differently: Take 20 mg by mouth  daily as needed.) 90 tablet 1   magnesium  oxide (MAG-OX) 400 MG tablet Take 1 tablet (400 mg total) by mouth daily.     Melatonin 10 MG TABS Take 10 mg by mouth at bedtime.     Multiple Vitamins-Minerals (CENTRUM SILVER  50+MEN) TABS Take 1 tablet by mouth daily.     nitroGLYCERIN  (NITROSTAT ) 0.4 MG SL tablet Place 1 tablet (0.4 mg total) under the tongue every 5 (five) minutes as needed for chest pain. 25 tablet 1   rosuvastatin  (CRESTOR ) 40 MG tablet TAKE 1 TABLET BY MOUTH EVERY DAY AT BEDTIME 100 tablet 2   sacubitril -valsartan  (ENTRESTO ) 49-51 MG TAKE 1 TABLET BY MOUTH TWICE DAILY 60 tablet 2   oxyCODONE -acetaminophen  (PERCOCET) 10-325 MG tablet Take 1 tablet by mouth every 4 (four) hours as needed. (Patient not taking: Reported on 08/31/2024)     traZODone (DESYREL) 100 MG tablet  Take 100 mg by mouth at bedtime. (Patient not taking: Reported on 08/31/2024)     No current facility-administered medications for this visit.    Allergies:   Bovine (beef) protein-containing drug products, Morphine  and codeine, and Propranolol    Social History:  The patient  reports that he has been smoking cigarettes. He started smoking about 49 years ago. He has a 22.5 pack-year smoking history. He has never used smokeless tobacco. He reports that he does not drink alcohol and does not use drugs.   Family History:  The patient's family history includes Anxiety disorder in his daughter; Heart attack in his brother, brother, father, mother, and sister; Hyperlipidemia in his daughter; Migraines in his daughter.    ROS:  Please see the history of present illness.   Otherwise, review of systems are positive for none.   All other systems are reviewed and negative.    PHYSICAL EXAM: VS:  BP 134/78 (BP Location: Left Arm, Patient Position: Sitting, Cuff Size: Normal)   Pulse 63   Ht 6' 1 (1.854 m)   Wt 173 lb 4 oz (78.6 kg)   SpO2 99%   BMI 22.86 kg/m  , BMI Body mass index is 22.86 kg/m. GEN: Well  nourished, well developed, in no acute distress  HEENT: normal  Neck: no JVD, carotid bruits, or masses Cardiac: RRR; no murmurs, rubs, or gallops,no edema  Respiratory:  clear to auscultation bilaterally, normal work of breathing GI: soft, nontender, nondistended, + BS MS: no deformity or atrophy  Skin: warm and dry, no rash Neuro:  Strength and sensation are intact Psych: euthymic mood, full affect   EKG:  EKG is ordered today. The ekg ordered today demonstrates: Sinus rhythm with 1st degree A-V block When compared with ECG of 19-Apr-2024 09:27, No significant change was found   Recent Labs: 09/13/2023: TSH 2.920 03/31/2024: Magnesium  2.3 05/08/2024: ALT 15; BUN 14; Creatinine, Ser 0.88; Hemoglobin 16.0; Platelets 238; Potassium 4.3; Sodium 133    Lipid Panel    Component Value Date/Time   CHOL 120 09/13/2023 1349   TRIG 75 09/13/2023 1349   HDL 49 09/13/2023 1349   CHOLHDL 2.4 09/13/2023 1349   LDLCALC 56 09/13/2023 1349      Wt Readings from Last 3 Encounters:  08/31/24 173 lb 4 oz (78.6 kg)  06/26/24 161 lb 3.2 oz (73.1 kg)  05/08/24 160 lb (72.6 kg)          11/25/2018    2:00 PM  PAD Screen  Previous PAD dx? No  Previous surgical procedure? No  Pain with walking? Yes  Subsides with rest? No  Feet/toe relief with dangling? No  Painful, non-healing ulcers? No  Extremities discolored? No      ASSESSMENT AND PLAN:  1.  Coronary artery disease involving native coronary arteries without angina: He is doing well post PCI in June with no recurrent angina.  Continue aspirin  indefinitely and clopidogrel  long-term as tolerated.  2.  Chronic systolic heart failure: EF of 30 to 35%.  I suspect that was possibly alcohol induced when he was drinking heavily.  Fortunately, he quit drinking completely.  He appears to be euvolemic without diuretic.  Continue Entresto  and carvedilol .  I am going to recheck his LV systolic function with an echocardiogram.  3.  Frequent  PVCs: No evidence of PVCs today by exam or by EKG.  Continue carvedilol .  4.  Essential hypertension: Blood pressure is well-controlled.  5.  Hyperlipidemia: Continue treatment with rosuvastatin  40  mg daily.  I requested lipid and liver profile.  6.  Tobacco use: He cut down to 5 cigarettes a day and he is hoping to quit completely.  7.  Oxycodone  addiction and excessive alcohol use: He attended outpatient rehab and has been clean for few months and he is feeling great.   Disposition:   FU with me in 6 months.  Signed,  Deatrice Cage, MD  08/31/2024 9:43 AM    [facility]

## 2024-08-31 NOTE — Patient Instructions (Signed)
 Medication Instructions:  No changes *If you need a refill on your cardiac medications before your next appointment, please call your pharmacy*  Lab Work: Your provider would like for you to have the following labs today: CMET and Lipid  If you have labs (blood work) drawn today and your tests are completely normal, you will receive your results only by: MyChart Message (if you have MyChart) OR A paper copy in the mail If you have any lab test that is abnormal or we need to change your treatment, we will call you to review the results.  Testing/Procedures: Your physician has requested that you have an echocardiogram. Echocardiography is a painless test that uses sound waves to create images of your heart. It provides your doctor with information about the size and shape of your heart and how well your heart's chambers and valves are working.   You may receive an ultrasound enhancing agent through an IV if needed to better visualize your heart during the echo. This procedure takes approximately one hour.  There are no restrictions for this procedure.  This will take place at [address] [facility]) [address], [place] [address]  Please note: We ask at that you not bring children with you during ultrasound (echo/ vascular) testing. Due to room size and safety concerns, children are not allowed in the ultrasound rooms during exams. Our front office staff cannot provide observation of children in our lobby area while testing is being conducted. An adult accompanying a patient to their appointment will only be allowed in the ultrasound room at the discretion of the ultrasound technician under special circumstances. We apologize for any inconvenience.   Follow-Up: At [facility], you and your health needs are our priority.  As part of our continuing mission to provide you with exceptional heart care, our providers are all part of one team.  This team includes your primary  Cardiologist (physician) and Advanced Practice Providers or APPs (Physician Assistants and Nurse Practitioners) who all work together to provide you with the care you need, when you need it.  Your next appointment:   6 month(s)  Provider:   You may see Deatrice Cage, MD or one of the following Advanced Practice Providers on your designated Care Team:   Lonni Meager, NP Lesley Maffucci, PA-C Bernardino Bring, PA-C Cadence Bonita, PA-C Tylene Lunch, NP Barnie Hila, NP    We recommend signing up for the patient portal called MyChart.  Sign up information is provided on this After Visit Summary.  MyChart is used to connect with patients for Virtual Visits (Telemedicine).  Patients are able to view lab/test results, encounter notes, upcoming appointments, etc.  Non-urgent messages can be sent to your provider as well.   To learn more about what you can do with MyChart, go to [email or web address].

## 2024-09-01 ENCOUNTER — Ambulatory Visit: Payer: Self-pay | Admitting: Cardiovascular Disease

## 2024-09-01 LAB — COMPREHENSIVE METABOLIC PANEL WITH GFR
ALT: 38 IU/L (ref 0–44)
AST: 31 IU/L (ref 0–40)
Albumin: 4.5 g/dL (ref 3.9–4.9)
Alkaline Phosphatase: 59 IU/L (ref 47–123)
BUN/Creatinine Ratio: 17 (ref 10–24)
BUN: 15 mg/dL (ref 8–27)
Bilirubin Total: 0.6 mg/dL (ref 0.0–1.2)
CO2: 28 mmol/L (ref 20–29)
Calcium: 9.2 mg/dL (ref 8.6–10.2)
Chloride: 92 mmol/L — ABNORMAL LOW (ref 96–106)
Creatinine, Ser: 0.89 mg/dL (ref 0.76–1.27)
Globulin, Total: 2.5 g/dL (ref 1.5–4.5)
Glucose: 91 mg/dL (ref 70–99)
Potassium: 4.6 mmol/L (ref 3.5–5.2)
Sodium: 135 mmol/L (ref 134–144)
Total Protein: 7 g/dL (ref 6.0–8.5)
eGFR: 95 mL/min/1.73 (ref >59)

## 2024-09-01 LAB — LIPID PANEL
Chol/HDL Ratio: 2 ratio (ref 0.0–5.0)
Cholesterol, Total: 142 mg/dL (ref 100–199)
HDL: 71 mg/dL (ref >39)
LDL Chol Calc (NIH): 56 mg/dL (ref 0–99)
Triglycerides: 79 mg/dL (ref 0–149)
VLDL Cholesterol Cal: 15 mg/dL (ref 5–40)

## 2024-09-13 ENCOUNTER — Other Ambulatory Visit: Payer: Self-pay | Admitting: Family

## 2024-09-27 ENCOUNTER — Other Ambulatory Visit: Payer: Self-pay | Admitting: Nurse Practitioner

## 2024-10-03 ENCOUNTER — Ambulatory Visit: Attending: Cardiovascular Disease

## 2024-10-03 DIAGNOSIS — I5022 Chronic systolic (congestive) heart failure: Secondary | ICD-10-CM

## 2024-10-03 LAB — ECHOCARDIOGRAM COMPLETE
AR max vel: 3.19 cm2
AV Area VTI: 3.04 cm2
AV Area mean vel: 2.97 cm2
AV Mean grad: 2.5 mmHg
AV Peak grad: 4.3 mmHg
Ao pk vel: 1.04 m/s
Area-P 1/2: 3.99 cm2
Calc EF: 32.5 %
S' Lateral: 4 cm
Single Plane A2C EF: 32.5 %
Single Plane A4C EF: 30.1 %

## 2024-10-24 ENCOUNTER — Other Ambulatory Visit: Payer: Self-pay | Admitting: Nurse Practitioner

## 2024-11-10 ENCOUNTER — Other Ambulatory Visit: Payer: Self-pay | Admitting: Nurse Practitioner

## 2025-03-01 ENCOUNTER — Ambulatory Visit: Admitting: Nurse Practitioner
